# Patient Record
Sex: Male | Born: 1960 | Race: White | Hispanic: No | Marital: Married | Smoking: Former smoker
Health system: Southern US, Community
[De-identification: ages and names within clinical notes are randomized; demographics above are authoritative.]

## PROBLEM LIST (undated history)

## (undated) DIAGNOSIS — M5136 Other intervertebral disc degeneration, lumbar region: Secondary | ICD-10-CM

## (undated) DIAGNOSIS — J45909 Unspecified asthma, uncomplicated: Secondary | ICD-10-CM

## (undated) DIAGNOSIS — E785 Hyperlipidemia, unspecified: Secondary | ICD-10-CM

## (undated) DIAGNOSIS — K219 Gastro-esophageal reflux disease without esophagitis: Secondary | ICD-10-CM

## (undated) DIAGNOSIS — R05 Cough: Secondary | ICD-10-CM

## (undated) DIAGNOSIS — I2510 Atherosclerotic heart disease of native coronary artery without angina pectoris: Secondary | ICD-10-CM

## (undated) DIAGNOSIS — M1990 Unspecified osteoarthritis, unspecified site: Secondary | ICD-10-CM

## (undated) DIAGNOSIS — R55 Syncope and collapse: Secondary | ICD-10-CM

## (undated) DIAGNOSIS — F419 Anxiety disorder, unspecified: Secondary | ICD-10-CM

## (undated) DIAGNOSIS — M549 Dorsalgia, unspecified: Secondary | ICD-10-CM

## (undated) DIAGNOSIS — M5030 Other cervical disc degeneration, unspecified cervical region: Secondary | ICD-10-CM

## (undated) DIAGNOSIS — G4733 Obstructive sleep apnea (adult) (pediatric): Secondary | ICD-10-CM

## (undated) DIAGNOSIS — R054 Cough syncope: Secondary | ICD-10-CM

## (undated) DIAGNOSIS — J449 Chronic obstructive pulmonary disease, unspecified: Secondary | ICD-10-CM

## (undated) DIAGNOSIS — M51369 Other intervertebral disc degeneration, lumbar region without mention of lumbar back pain or lower extremity pain: Secondary | ICD-10-CM

## (undated) DIAGNOSIS — Z87442 Personal history of urinary calculi: Secondary | ICD-10-CM

## (undated) DIAGNOSIS — Z85828 Personal history of other malignant neoplasm of skin: Secondary | ICD-10-CM

## (undated) DIAGNOSIS — I452 Bifascicular block: Secondary | ICD-10-CM

## (undated) HISTORY — DX: Gastro-esophageal reflux disease without esophagitis: K21.9

## (undated) HISTORY — DX: Personal history of other malignant neoplasm of skin: Z85.828

## (undated) HISTORY — DX: Bifascicular block: I45.2

## (undated) HISTORY — DX: Dorsalgia, unspecified: M54.9

## (undated) HISTORY — DX: Other intervertebral disc degeneration, lumbar region without mention of lumbar back pain or lower extremity pain: M51.369

## (undated) HISTORY — DX: Syncope and collapse: R55

## (undated) HISTORY — DX: Cough: R05

## (undated) HISTORY — DX: Obstructive sleep apnea (adult) (pediatric): G47.33

## (undated) HISTORY — DX: Other cervical disc degeneration, unspecified cervical region: M50.30

## (undated) HISTORY — DX: Other intervertebral disc degeneration, lumbar region: M51.36

## (undated) HISTORY — DX: Cough syncope: R05.4

## (undated) HISTORY — DX: Anxiety disorder, unspecified: F41.9

## (undated) HISTORY — DX: Chronic obstructive pulmonary disease, unspecified: J44.9

## (undated) HISTORY — PX: OTHER SURGICAL HISTORY: SHX169

## (undated) HISTORY — DX: Hyperlipidemia, unspecified: E78.5

---

## 1997-09-23 ENCOUNTER — Ambulatory Visit (HOSPITAL_COMMUNITY): Admission: RE | Admit: 1997-09-23 | Discharge: 1997-09-23 | Payer: Self-pay | Admitting: Neurosurgery

## 1998-03-04 ENCOUNTER — Encounter: Payer: Self-pay | Admitting: Neurosurgery

## 1998-03-04 ENCOUNTER — Ambulatory Visit (HOSPITAL_COMMUNITY): Admission: RE | Admit: 1998-03-04 | Discharge: 1998-03-04 | Payer: Self-pay | Admitting: Neurosurgery

## 1999-12-12 ENCOUNTER — Ambulatory Visit (HOSPITAL_BASED_OUTPATIENT_CLINIC_OR_DEPARTMENT_OTHER): Admission: RE | Admit: 1999-12-12 | Discharge: 1999-12-12 | Payer: Self-pay | Admitting: Pulmonary Disease

## 2000-05-29 ENCOUNTER — Encounter: Payer: Self-pay | Admitting: Unknown Physician Specialty

## 2000-05-29 ENCOUNTER — Encounter: Admission: RE | Admit: 2000-05-29 | Discharge: 2000-05-29 | Payer: Self-pay | Admitting: Unknown Physician Specialty

## 2000-10-13 ENCOUNTER — Encounter: Payer: Self-pay | Admitting: Pulmonary Disease

## 2000-10-13 ENCOUNTER — Ambulatory Visit (HOSPITAL_BASED_OUTPATIENT_CLINIC_OR_DEPARTMENT_OTHER): Admission: RE | Admit: 2000-10-13 | Discharge: 2000-10-13 | Payer: Self-pay | Admitting: Pulmonary Disease

## 2000-12-28 ENCOUNTER — Ambulatory Visit (HOSPITAL_COMMUNITY): Admission: AD | Admit: 2000-12-28 | Discharge: 2000-12-28 | Payer: Self-pay | Admitting: Neurosurgery

## 2000-12-28 ENCOUNTER — Encounter: Payer: Self-pay | Admitting: Neurosurgery

## 2001-11-26 ENCOUNTER — Ambulatory Visit (HOSPITAL_COMMUNITY): Admission: RE | Admit: 2001-11-26 | Discharge: 2001-11-26 | Payer: Self-pay | Admitting: Internal Medicine

## 2002-02-01 ENCOUNTER — Emergency Department (HOSPITAL_COMMUNITY): Admission: EM | Admit: 2002-02-01 | Discharge: 2002-02-01 | Payer: Self-pay | Admitting: Emergency Medicine

## 2002-04-03 ENCOUNTER — Ambulatory Visit (HOSPITAL_COMMUNITY): Admission: RE | Admit: 2002-04-03 | Discharge: 2002-04-03 | Payer: Self-pay | Admitting: Internal Medicine

## 2002-09-09 ENCOUNTER — Ambulatory Visit (HOSPITAL_COMMUNITY): Admission: RE | Admit: 2002-09-09 | Discharge: 2002-09-09 | Payer: Self-pay | Admitting: Neurosurgery

## 2002-09-09 ENCOUNTER — Encounter: Payer: Self-pay | Admitting: Neurosurgery

## 2003-09-04 ENCOUNTER — Encounter: Payer: Self-pay | Admitting: Pulmonary Disease

## 2003-09-04 ENCOUNTER — Ambulatory Visit (HOSPITAL_BASED_OUTPATIENT_CLINIC_OR_DEPARTMENT_OTHER): Admission: RE | Admit: 2003-09-04 | Discharge: 2003-09-04 | Payer: Self-pay | Admitting: Pulmonary Disease

## 2004-08-24 ENCOUNTER — Ambulatory Visit: Payer: Self-pay | Admitting: Pulmonary Disease

## 2005-06-23 ENCOUNTER — Ambulatory Visit: Payer: Self-pay | Admitting: Family Medicine

## 2005-07-08 ENCOUNTER — Ambulatory Visit: Payer: Self-pay | Admitting: Pulmonary Disease

## 2005-12-28 ENCOUNTER — Ambulatory Visit: Payer: Self-pay | Admitting: Internal Medicine

## 2006-01-04 ENCOUNTER — Ambulatory Visit: Payer: Self-pay | Admitting: Pulmonary Disease

## 2006-04-12 ENCOUNTER — Emergency Department (HOSPITAL_COMMUNITY): Admission: EM | Admit: 2006-04-12 | Discharge: 2006-04-12 | Payer: Self-pay | Admitting: Emergency Medicine

## 2006-04-14 ENCOUNTER — Ambulatory Visit: Payer: Self-pay | Admitting: Pulmonary Disease

## 2006-09-13 ENCOUNTER — Ambulatory Visit: Payer: Self-pay | Admitting: Pulmonary Disease

## 2007-03-12 ENCOUNTER — Ambulatory Visit: Payer: Self-pay | Admitting: Pulmonary Disease

## 2007-05-11 DIAGNOSIS — J439 Emphysema, unspecified: Secondary | ICD-10-CM | POA: Insufficient documentation

## 2007-05-11 DIAGNOSIS — J209 Acute bronchitis, unspecified: Secondary | ICD-10-CM | POA: Insufficient documentation

## 2007-05-11 DIAGNOSIS — K219 Gastro-esophageal reflux disease without esophagitis: Secondary | ICD-10-CM | POA: Insufficient documentation

## 2007-05-11 DIAGNOSIS — G4733 Obstructive sleep apnea (adult) (pediatric): Secondary | ICD-10-CM | POA: Insufficient documentation

## 2007-06-27 ENCOUNTER — Ambulatory Visit: Payer: Self-pay | Admitting: Pulmonary Disease

## 2007-08-02 ENCOUNTER — Emergency Department (HOSPITAL_COMMUNITY): Admission: EM | Admit: 2007-08-02 | Discharge: 2007-08-02 | Payer: Self-pay | Admitting: Emergency Medicine

## 2007-12-31 ENCOUNTER — Ambulatory Visit: Payer: Self-pay | Admitting: Pulmonary Disease

## 2008-08-20 ENCOUNTER — Telehealth (INDEPENDENT_AMBULATORY_CARE_PROVIDER_SITE_OTHER): Payer: Self-pay | Admitting: *Deleted

## 2008-09-08 ENCOUNTER — Ambulatory Visit: Payer: Self-pay | Admitting: Pulmonary Disease

## 2009-03-27 ENCOUNTER — Ambulatory Visit: Payer: Self-pay | Admitting: Pulmonary Disease

## 2009-06-02 ENCOUNTER — Telehealth: Payer: Self-pay | Admitting: Pulmonary Disease

## 2009-09-20 ENCOUNTER — Encounter: Payer: Self-pay | Admitting: Cardiology

## 2009-09-20 ENCOUNTER — Emergency Department (HOSPITAL_COMMUNITY): Admission: EM | Admit: 2009-09-20 | Discharge: 2009-09-20 | Payer: Self-pay | Admitting: Emergency Medicine

## 2009-09-21 ENCOUNTER — Encounter: Payer: Self-pay | Admitting: Cardiology

## 2009-10-02 ENCOUNTER — Encounter: Payer: Self-pay | Admitting: Cardiology

## 2009-10-19 ENCOUNTER — Encounter: Payer: Self-pay | Admitting: Cardiology

## 2009-10-20 ENCOUNTER — Ambulatory Visit: Payer: Self-pay | Admitting: Cardiology

## 2009-10-20 DIAGNOSIS — R55 Syncope and collapse: Secondary | ICD-10-CM | POA: Insufficient documentation

## 2009-10-20 DIAGNOSIS — E663 Overweight: Secondary | ICD-10-CM | POA: Insufficient documentation

## 2009-10-20 DIAGNOSIS — R079 Chest pain, unspecified: Secondary | ICD-10-CM | POA: Insufficient documentation

## 2009-10-20 DIAGNOSIS — F17200 Nicotine dependence, unspecified, uncomplicated: Secondary | ICD-10-CM | POA: Insufficient documentation

## 2009-10-20 DIAGNOSIS — I4510 Unspecified right bundle-branch block: Secondary | ICD-10-CM | POA: Insufficient documentation

## 2009-10-26 ENCOUNTER — Encounter: Payer: Self-pay | Admitting: Cardiology

## 2009-10-26 ENCOUNTER — Ambulatory Visit: Payer: Self-pay | Admitting: Cardiology

## 2009-11-09 ENCOUNTER — Encounter: Payer: Self-pay | Admitting: Cardiology

## 2009-11-10 ENCOUNTER — Ambulatory Visit: Payer: Self-pay | Admitting: Cardiology

## 2010-07-20 NOTE — Miscellaneous (Signed)
  Clinical Lists Changes  Problems: Added new problem of TOBACCO ABUSE (ICD-305.1) Added new problem of OVERWEIGHT (ICD-278.02) Added new problem of CHEST PAIN (ICD-786.50) Added new problem of RBBB (ICD-426.4) Added new problem of SYNCOPE (ICD-780.2) Added new problem of * ANXIETY Observations: Added new observation of PRIMARY MD: Ardeen Garland, MD (10/20/2009 10:25) Added new observation of PAST MED HX: OSA....c-pap..Dr. Shelle Iron Tobacco abuse Emphysema Overweight Chest pain RBBB Syncope  negative w/u in the past Anxiety (10/20/2009 10:25)       Past History:  Past Medical History: OSA....c-pap..Dr. Shelle Iron Tobacco abuse Emphysema Overweight Chest pain RBBB Syncope  negative w/u in the past Anxiety

## 2010-07-20 NOTE — Assessment & Plan Note (Signed)
Summary: 3WK FU-  Medications Added CITALOPRAM HYDROBROMIDE 20 MG TABS (CITALOPRAM HYDROBROMIDE) Take 1 tablet by mouth once a day      Allergies Added: NKDA  Visit Type:  Follow-up Referring Provider:  Clance Primary Provider:  Ardeen Garland, MD  CC:  right bundle branch block.  History of Present Illness: The patient is seen for followup of right bundle branch block and some chest discomfort.  When I saw him on Oct 20, 2009 I thought that the chest discomfort was related to coughing.  In addition there was history of right bundle branch block in the past.  Decision was made to document his LV function to ensure he was stable.  Echo was done Oct 26, 2009.  Ejection fraction is 55-60%.  There is normal right ventricular size and function.  There was no significant tricuspid regurgitation, so right heart pressures could not be estimated.  However there was no evidence that it was elevated.  He is been stable since that visit.  Preventive Screening-Counseling & Management  Alcohol-Tobacco     Smoking Status: current     Smoking Cessation Counseling: yes     Packs/Day: 2.5 PPD  Current Medications (verified): 1)  Advair Diskus 500-50 Mcg/dose  Misc (Fluticasone-Salmeterol) .... Inhale One Puff Two Times A Day 2)  Bayer Low Strength 81 Mg  Tbec (Aspirin) .... Take By Mouth Once Daily 3)  Nexium 40 Mg  Cpdr (Esomeprazole Magnesium) .... Take By Mouth Two Times A Day 4)  Proair Hfa 108 (90 Base) Mcg/act  Aers (Albuterol Sulfate) .Marland Kitchen.. 1-2 Puffs Every 4-6 Hours As Needed 5)  Citalopram Hydrobromide 20 Mg Tabs (Citalopram Hydrobromide) .... Take 1 Tablet By Mouth Once A Day 6)  Arthrotec 75-200 Mg-Mcg Tabs (Diclofenac-Misoprostol) .... Take 1 Tablet By Mouth Two Times A Day  Allergies (verified): No Known Drug Allergies  Comments:  Nurse/Medical Assistant: The patient's medications and allergies were reviewed with the patient and were updated in the Medication and Allergy Lists. Verbally  reviewed.  Past History:  Past Medical History: Last updated: 11/09/2009 OSA....c-pap..Dr. Shelle Iron Tobacco abuse Emphysema Overweight Chest pain RBBB Syncope  negative w/u in the past Anxiety Cough syncope Back pain EF   55-60%...echo...10/26/2009 (could not estimate RV pressure--RV OK)  Social History: Packs/Day:  2.5 PPD  Review of Systems       Patient denies fever, chills, headache, sweats, rash, change in vision, change in hearing, chest pain, cough, nausea or vomiting, urinary symptoms.  All other systems are reviewed and are negative.  Vital Signs:  Patient profile:   50 year old male Height:      71 inches Weight:      344 pounds Pulse rate:   80 / minute BP sitting:   117 / 72  (left arm) Cuff size:   large  Vitals Entered By: Carlye Grippe (Nov 10, 2009 1:38 PM) CC: right bundle branch block   Physical Exam  General:  patient is overweight but stable. Eyes:  no xanthelasma. Neck:  no jugular venous distention. Lungs:  lungs are clear respiratory effort is nonlabored. Heart:  cardiac exam reveals S1-S2.  No clicks or significant murmurs. Abdomen:  abdomen soft. Extremities:  no peripheral edema. Psych:  patient is oriented to person time and place.  Affect is normal.   Impression & Recommendations:  Problem # 1:  SYNCOPE (ICD-780.2)  His updated medication list for this problem includes:    Bayer Low Strength 81 Mg Tbec (Aspirin) .Marland Kitchen... Take by mouth once  daily No recurrent syncope.  No further workup.  Problem # 2:  RBBB (ICD-426.4)  His updated medication list for this problem includes:    Bayer Low Strength 81 Mg Tbec (Aspirin) .Marland Kitchen... Take by mouth once daily Right bundle branch block is old.  There is normal LV function.  No further workup.  Problem # 3:  TOBACCO ABUSE (ICD-305.1) the patient clearly needs to stop smoking.  He is counseled by me to do so.  Problem # 4:  OVERWEIGHT (ICD-278.02) Patient needs to lose weight.  Patient  Instructions: 1)  Your physician discussed the hazards of tobacco use.  Tobacco use cessation is recommended and techniques and options to help you quit were discussed.

## 2010-07-20 NOTE — Letter (Signed)
Summary: External Correspondence/ NOTE PRIMARY CARE  External Correspondence/ NOTE PRIMARY CARE   Imported By: Dorise Hiss 10/05/2009 15:52:14  _____________________________________________________________________  External Attachment:    Type:   Image     Comment:   External Document

## 2010-07-20 NOTE — Letter (Signed)
Summary: Internal Other/ PATIENT HISTORY FORM  Internal Other/ PATIENT HISTORY FORM   Imported By: Dorise Hiss 10/21/2009 14:33:18  _____________________________________________________________________  External Attachment:    Type:   Image     Comment:   External Document

## 2010-07-20 NOTE — Miscellaneous (Signed)
  Clinical Lists Changes  Observations: Added new observation of PAST MED HX: OSA....c-pap..Dr. Shelle Iron Tobacco abuse Emphysema Overweight Chest pain RBBB Syncope  negative w/u in the past Anxiety Cough syncope Back pain EF   55-60%...echo...10/26/2009 (could not estimate RV pressure--RV OK) (11/09/2009 10:56) Added new observation of REFERRING MD: Clance (11/09/2009 10:56) Added new observation of PRIMARY MD: Ardeen Garland, MD (11/09/2009 10:56)       Past History:  Past Medical History: OSA....c-pap..Dr. Shelle Iron Tobacco abuse Emphysema Overweight Chest pain RBBB Syncope  negative w/u in the past Anxiety Cough syncope Back pain EF   55-60%...echo...10/26/2009 (could not estimate RV pressure--RV OK)

## 2010-07-20 NOTE — Assessment & Plan Note (Signed)
Summary: np-abnormal ekg  Medications Added LEXAPRO 20 MG TABS (ESCITALOPRAM OXALATE) Take 1 tablet by mouth once a day ARTHROTEC 75-200 MG-MCG TABS (DICLOFENAC-MISOPROSTOL) Take 1 tablet by mouth two times a day      Allergies Added: NKDA  Visit Type:  consult Referring Provider:  Clance Primary Provider:  Ardeen Garland, MD  CC:  abnormal EKG.  History of Present Illness: The patient is here for cardiac evaluation to assess an abnormal EKG. the patient is a heavy smoker.  He has COPD.  He has sleep apnea and he does use CPAP.  He is followed by Dr. Shelle Iron.  Recently he had some chest discomfort related to coughing.  EKG was noted to show right bundle branch block.  He is referred for further evaluation.  He's not been having any significant chest pain.  There is no prior history of coronary disease.  Preventive Screening-Counseling & Management  Alcohol-Tobacco     Smoking Status: current     Smoking Cessation Counseling: yes     Packs/Day: 2&1/2 PPD  Current Medications (verified): 1)  Advair Diskus 500-50 Mcg/dose  Misc (Fluticasone-Salmeterol) .... Inhale One Puff Two Times A Day 2)  Bayer Low Strength 81 Mg  Tbec (Aspirin) .... Take By Mouth Once Daily 3)  Nexium 40 Mg  Cpdr (Esomeprazole Magnesium) .... Take By Mouth Two Times A Day 4)  Proair Hfa 108 (90 Base) Mcg/act  Aers (Albuterol Sulfate) .Marland Kitchen.. 1-2 Puffs Every 4-6 Hours As Needed 5)  Lexapro 20 Mg Tabs (Escitalopram Oxalate) .... Take 1 Tablet By Mouth Once A Day 6)  Arthrotec 75-200 Mg-Mcg Tabs (Diclofenac-Misoprostol) .... Take 1 Tablet By Mouth Two Times A Day  Allergies (verified): No Known Drug Allergies  Comments:  Nurse/Medical Assistant: The patient's medications and allergies were reviewed with the patient and were updated in the Medication and Allergy Lists. List reviewed.  Past History:  Past Medical History: OSA....c-pap..Dr. Shelle Iron Tobacco abuse Emphysema Overweight Chest pain RBBB Syncope   negative w/u in the past Anxiety Cough syncope Back pain  Social History: Packs/Day:  2&1/2 PPD  Review of Systems       Patient denies fever, chills, headache, sweats, rash, change in vision, change in hearing, nausea vomiting, urinary symptoms.  All of the systems are reviewed and are negative.  Vital Signs:  Patient profile:   50 year old male Height:      71 inches Weight:      341 pounds Pulse rate:   88 / minute BP sitting:   104 / 70  (left arm) Cuff size:   large  Vitals Entered By: Carlye Grippe (Oct 20, 2009 2:15 PM) CC: abnormal EKG   Physical Exam  General:  The patient is stable.  He does smell of cigarette smoke.Is also overweight. Head:  head is atraumatic. Eyes:  eyes reveal no xanthelasma. Neck:  no jugular venous distention.  No carotid bruits. Chest Wall:  no chest wall tenderness today. Lungs:  lungs are clear.  Respiratory effort is nonlabored. Heart:  cardiac exam reveals S1-S2.  There are no clicks or significant murmurs. Abdomen:  abdomen is obese. Msk:  no musculoskeletal deformities. Extremities:  no peripheral edema. Skin:  no skin rashes. Psych:  patient is oriented to person time and place.  Affect is normal.   Impression & Recommendations:  Problem # 1:  RBBB (ICD-426.4)  His updated medication list for this problem includes:    Bayer Low Strength 81 Mg Tbec (Aspirin) .Marland Kitchen... Take by  mouth once daily EKG is done today and reviewed by me.  It is compared to a tracing from September 20, 2009.  Patient has incomplete right bundle branch block.  There are no Q waves.  There is normal sinus rhythm.  Because of this finding we will proceed with two-dimensional echo to assess LV function.  He does not need exercise testing.  I will see him back for followup. it is okay for him to go back to full activities in the meantime.  Orders: 2-D Echocardiogram (2D Echo)  Problem # 2:  CHEST PAIN (ICD-786.50)  His updated medication list for this problem  includes:    Bayer Low Strength 81 Mg Tbec (Aspirin) .Marland Kitchen... Take by mouth once daily  Orders: EKG w/ Interpretation (93000) Patient's recent chest pain was related to coughing.  No further workup at this time.  His updated medication list for this problem includes:    Bayer Low Strength 81 Mg Tbec (Aspirin) .Marland Kitchen... Take by mouth once daily  Problem # 3:  OVERWEIGHT (ICD-278.02) The patient needs to lose weight.  Problem # 4:  TOBACCO ABUSE (ICD-305.1) Ufortunately the patient continues to smoke heavily.  I counseled him to stop.  Problem # 5:  SYNCOPE (ICD-780.2)  His updated medication list for this problem includes:    Bayer Low Strength 81 Mg Tbec (Aspirin) .Marland Kitchen... Take by mouth once daily The patient has a history of cough syncope.  He knows that he should always be sitting down if he begins to cough.  Patient Instructions: 1)  Follow up appt with Dr. Myrtis Ser on Tuesday, May 24th at 1:30pm. 2)  Your physician has requested that you have an echocardiogram.  Echocardiography is a painless test that uses sound waves to create images of your heart. It provides your doctor with information about the size and shape of your heart and how well your heart's chambers and valves are working.  This procedure takes approximately one hour. There are no restrictions for this procedure. 3)  Your physician recommends that you continue on your current medications as directed. Please refer to the Current Medication list given to you today.

## 2010-07-20 NOTE — Medication Information (Signed)
Summary: RX Folder/ [facility]  RX Folder/ [facility]   Imported By: Dorise Hiss 10/05/2009 15:56:50  _____________________________________________________________________  External Attachment:    Type:   Image     Comment:   External Document

## 2010-07-20 NOTE — Letter (Signed)
Summary: External Correspondence/ NOTE PRIMARY CARE  External Correspondence/ NOTE PRIMARY CARE   Imported By: Dorise Hiss 10/05/2009 15:49:06  _____________________________________________________________________  External Attachment:    Type:   Image     Comment:   External Document

## 2010-08-25 ENCOUNTER — Telehealth (INDEPENDENT_AMBULATORY_CARE_PROVIDER_SITE_OTHER): Payer: Self-pay | Admitting: *Deleted

## 2010-08-26 ENCOUNTER — Ambulatory Visit: Payer: Self-pay | Admitting: Pulmonary Disease

## 2010-08-26 ENCOUNTER — Telehealth (INDEPENDENT_AMBULATORY_CARE_PROVIDER_SITE_OTHER): Payer: Self-pay | Admitting: *Deleted

## 2010-08-30 ENCOUNTER — Ambulatory Visit: Payer: Self-pay | Admitting: Pulmonary Disease

## 2010-08-31 ENCOUNTER — Telehealth: Payer: Self-pay | Admitting: Pulmonary Disease

## 2010-08-31 NOTE — Progress Notes (Signed)
Summary: Advair sample<<<pt needs to keep OV on 3/12 for sample or refill  Phone Note Call from Patient Call back at Home Phone [phone]   Caller: Patient Call For: clance Reason for Call: Talk to Nurse Summary of Call: Patient asking for sample of advair.  Initial call taken by: Lehman Prom,  August 26, 2010 9:51 AM  Follow-up for Phone Call        patient called back and wanted to know if his wife can pick up the samples of Advir she is at [facility] with her mother. He can be reached at [phone].Vedia Coffer  August 26, 2010 10:16 AM  Pt last seen by [facility] 03/27/2009. Pt aware we cannot leave sample of Advair or call in RX before his appt with [facility] on Mon., 08/30/2010. If he is having any problems he will need to go to an urgent care or to the ER. Pt did not want OV with TP. Pt aware he MUST keep OV w/ [facility] on 08/30/2010 for any refills. Follow-up by: Michel Bickers CMA,  August 26, 2010 10:35 AM

## 2010-08-31 NOTE — Progress Notes (Signed)
Summary: advair refill  Phone Note Call from Patient Call back at Home Phone [phone]   Caller: Patient Call For: clance Summary of Call: pt requests a rx for advair (wants 3 months supply). i advised that pt needed to make an appt w/ kc as he hasn't been seen since 03/27/2009. pt will see kc tomorrow. he says he will need a sample at time of visit.  Initial call taken by: Tivis Ringer, CNA,  August 25, 2010 1:02 PM  Follow-up for Phone Call        Spoke with pt.  He is requesting 90 day supply of advair and proair be sent to mail order pharm.  I advised that we can give him sample at [facility] tommorrow and send in rx then, or we can send 1 month supply to local pharm.  He states that he would rather just have the proair sent to local pharm since he can not afford both right now.  He states will be sure and keep appt tommorrow for to have advair refilled.  I advised that he should only use the proair as needed today and do not use like he does the advair b/c meds do not work the same way and he verbalized understanding. Follow-up by: Vernie Murders,  August 25, 2010 2:24 PM    Prescriptions: PROAIR HFA 108 (90 BASE) MCG/ACT  AERS (ALBUTEROL SULFATE) 1-2 puffs every 4-6 hours as needed  #1 x 0   Entered by:   Vernie Murders   Authorized by:   Barbaraann Share MD   Signed by:   Vernie Murders on 08/25/2010   Method used:   Electronically to        [facility] [phone]* (retail)       [address]       [place], [place]  [address]       Ph: [phone] or [phone]       Fax: [phone]   RxID:   [id number]

## 2010-09-02 ENCOUNTER — Ambulatory Visit: Payer: Self-pay | Admitting: Pulmonary Disease

## 2010-09-03 ENCOUNTER — Encounter: Payer: Self-pay | Admitting: Pulmonary Disease

## 2010-09-03 ENCOUNTER — Ambulatory Visit (INDEPENDENT_AMBULATORY_CARE_PROVIDER_SITE_OTHER): Payer: Medicare Other | Admitting: Pulmonary Disease

## 2010-09-03 DIAGNOSIS — J449 Chronic obstructive pulmonary disease, unspecified: Secondary | ICD-10-CM

## 2010-09-03 DIAGNOSIS — R059 Cough, unspecified: Secondary | ICD-10-CM | POA: Insufficient documentation

## 2010-09-03 DIAGNOSIS — R05 Cough: Secondary | ICD-10-CM | POA: Insufficient documentation

## 2010-09-03 DIAGNOSIS — G4733 Obstructive sleep apnea (adult) (pediatric): Secondary | ICD-10-CM

## 2010-09-07 NOTE — Progress Notes (Signed)
Summary: nos appt  Phone Note Call from Patient   Caller: [email or web address]  Call For: clance Summary of Call: [facility] nos from 3/12 to 3/16. Initial call taken by: Darletta Moll,  August 31, 2010 3:54 PM

## 2010-09-08 LAB — POCT CARDIAC MARKERS: CKMB, poc: 1.5 ng/mL (ref 1.0–8.0)

## 2010-09-08 LAB — CBC
HCT: 44.4 % (ref 39.0–52.0)
Hemoglobin: 15.7 g/dL (ref 13.0–17.0)
MCHC: 35.3 g/dL (ref 30.0–36.0)
MCV: 92.1 fL (ref 78.0–100.0)
RDW: 13.8 % (ref 11.5–15.5)

## 2010-09-08 LAB — BASIC METABOLIC PANEL
CO2: 26 mEq/L (ref 19–32)
Calcium: 9.3 mg/dL (ref 8.4–10.5)
Chloride: 102 mEq/L (ref 96–112)
Glucose, Bld: 110 mg/dL — ABNORMAL HIGH (ref 70–99)
Sodium: 136 mEq/L (ref 135–145)

## 2010-09-08 LAB — DIFFERENTIAL
Basophils Absolute: 0 10*3/uL (ref 0.0–0.1)
Basophils Relative: 0 % (ref 0–1)
Eosinophils Absolute: 0.1 10*3/uL (ref 0.0–0.7)
Eosinophils Relative: 1 % (ref 0–5)
Monocytes Absolute: 0.5 10*3/uL (ref 0.1–1.0)

## 2010-09-08 LAB — D-DIMER, QUANTITATIVE: D-Dimer, Quant: 0.23 ug/mL-FEU (ref 0.00–0.48)

## 2010-09-14 ENCOUNTER — Telehealth: Payer: Self-pay | Admitting: Pulmonary Disease

## 2010-09-14 NOTE — Telephone Encounter (Signed)
Called [facility]  And gave a verbal order for 10-20cm on auto cpap

## 2010-09-14 NOTE — Telephone Encounter (Signed)
Please advise pcc. Thanks  Carver Fila, CMA

## 2010-09-14 NOTE — Telephone Encounter (Signed)
Ok to start at 10-20.  Let pcc know so they can call

## 2010-09-15 NOTE — Telephone Encounter (Signed)
Unsure why this was sent back to triage, msg has been taken care of.  CPAP added to Care Coordination Notes.  Will sign off.

## 2010-09-16 NOTE — Assessment & Plan Note (Signed)
Summary: rov for copd, cough syncope, and osa   Primary Provider/Referring Provider:  Ardeen Garland, MD  CC:  Overdue for a f/u appt.  Last seen Oct. 2010.  Pt requesting rx for 90 day supply for Advair. Pt states he is using his rescue inhaler approx 1 to 3 x daily.  Pt is currently smoking 2 ppd.   Pt c/o increased sob with exertion and occ cough with green sputum. Marland Kitchen  History of Present Illness: the pt comes in today for f/u of his known copd, cough syncope, and osa.  He has not been seen since 2010, and continues to smoke 2ppd.  He continues to have doe, but it is near his usual baseline.  He still uses his rescue inhaler at least twice a day.  He has chronic mucus production, and is discolored at times. He continues to have cough, with syncope at times.  He is wearing his cpap, but has not been keeping up with supplies or mask.    Preventive Screening-Counseling & Management  Alcohol-Tobacco     Smoking Status: current     Smoking Cessation Counseling: yes     Packs/Day: 2.0     Tobacco Counseling: to quit use of tobacco products  Current Medications (verified): 1)  Advair Diskus 500-50 Mcg/dose  Misc (Fluticasone-Salmeterol) .... Inhale One Puff Two Times A Day **pt Needs Ov For Additional Refills ** 2)  Bayer Low Strength 81 Mg  Tbec (Aspirin) .... Take By Mouth Once Daily 3)  Nexium 40 Mg  Cpdr (Esomeprazole Magnesium) .... Take By Mouth Two Times A Day 4)  Proair Hfa 108 (90 Base) Mcg/act  Aers (Albuterol Sulfate) .Marland Kitchen.. 1-2 Puffs Every 4-6 Hours As Needed 5)  Citalopram Hydrobromide 20 Mg Tabs (Citalopram Hydrobromide) .... Take 1 Tablet By Mouth Once A Day 6)  Diclofenac Sodium 75 Mg Tbec (Diclofenac Sodium) .... Take 1 Tablet By Mouth Two Times A Day 7)  Hydrocodone-Acetaminophen 5-500 Mg Tabs (Hydrocodone-Acetaminophen) .Marland Kitchen.. 1 Every 6 Hours For Pain  Allergies (verified): No Known Drug Allergies  Past History:  Past medical, surgical, family and social histories (including risk  factors) reviewed, and no changes noted (except as noted below).  Past Medical History: Reviewed history from 11/09/2009 and no changes required. OSA....c-pap..Dr. Shelle Iron Tobacco abuse Emphysema Overweight Chest pain RBBB Syncope  negative w/u in the past Anxiety Cough syncope Back pain EF   55-60%...echo...10/26/2009 (could not estimate RV pressure--RV OK)  Family History: Reviewed history and no changes required.  Social History: Reviewed history from 03/27/2009 and no changes required. Patient is a current smoker.  2 ppd.   Packs/Day:  2.0  Review of Systems       The patient complains of shortness of breath with activity, productive cough, acid heartburn, indigestion, weight change, nasal congestion/difficulty breathing through nose, joint stiffness or pain, and change in color of mucus.  The patient denies shortness of breath at rest, non-productive cough, coughing up blood, chest pain, irregular heartbeats, loss of appetite, abdominal pain, difficulty swallowing, sore throat, tooth/dental problems, headaches, sneezing, itching, ear ache, anxiety, depression, hand/feet swelling, rash, and fever.    Vital Signs:  Patient profile:   50 year old male Height:      71 inches Weight:      365.25 pounds BMI:     51.13 O2 Sat:      95 % on Room air Temp:     98.0 degrees F oral Pulse rate:   88 / minute BP sitting:  134 / 78  (left arm) Cuff size:   large  Vitals Entered By: Arman Filter LPN (September 03, 2010 11:07 AM)  O2 Flow:  Room air CC: Overdue for a f/u appt.  Last seen Oct. 2010.  Pt requesting rx for 90 day supply for Advair. Pt states he is using his rescue inhaler approx 1 to 3 x daily.  Pt is currently smoking 2 ppd.   Pt c/o increased sob with exertion and occ cough with green sputum.  Comments Medications reviewed with patient Arman Filter LPN  September 03, 2010 11:13 AM    Physical Exam  General:  morbidly obese male in nad  Nose:  no skin breakdown or  pressure necrosis from cpap mask Lungs:  mild decrease in bs, no wheezing or rhonchi Heart:  rrr Extremities:  mild edema, no cyanosis  Neurologic:  alert and oriented, moves all 4    Impression & Recommendations:  Problem # 1:  COUGH (ICD-786.2) the pt continues to have cough with syncope at times.  I have explained to him again the first step is to stop smoking!!  His is also on advair which can sometime irritate the upper airway and worsen cough.  Will try him on dulera to see if his cough improves.    Problem # 2:  COPD (ICD-496) the pt continues to have doe that is near his usual baseline.  He has not had a recent exacerbation or pulmonary infection by his history.  I have again stressed the need for him to stop smoking.  Problem # 3:  OBSTRUCTIVE SLEEP APNEA (ICD-327.23) the pt needs new cpap supplies, and would benefit from re-optimization of his pressure.  I have asked him to work aggressively on weight loss.    Medications Added to Medication List This Visit: 1)  Diclofenac Sodium 75 Mg Tbec (Diclofenac sodium) .... Take 1 tablet by mouth two times a day 2)  Hydrocodone-acetaminophen 5-500 Mg Tabs (Hydrocodone-acetaminophen) .Marland Kitchen.. 1 every 6 hours for pain  Other Orders: Est. Patient Level IV (81191) DME Referral (DME) Tobacco use cessation intermediate 3-10 minutes (47829)  Patient Instructions: 1)  will try you on dulera 200/5 in the place of advair....2 inhalations am and pm to see if less irritating to your cough.  if you think you do better with dulera, let us know.  If not, go back to your advair and let us know to send in refills.  2)  will arrange for new cpap mask and supplies, and will recheck you pressure level. 3)  work on weight loss, and quit smoking! 4)  followup with me in 6mos

## 2010-10-30 ENCOUNTER — Other Ambulatory Visit: Payer: Self-pay | Admitting: Pulmonary Disease

## 2010-10-30 DIAGNOSIS — G4733 Obstructive sleep apnea (adult) (pediatric): Secondary | ICD-10-CM

## 2010-11-01 ENCOUNTER — Telehealth: Payer: Self-pay | Admitting: Pulmonary Disease

## 2010-11-01 NOTE — Telephone Encounter (Signed)
[place] or [place], did one of you call the pt? Pls advise thanks

## 2010-11-03 NOTE — Telephone Encounter (Signed)
Spoke to daughter in law she is aware of cpap changes

## 2010-11-05 NOTE — Op Note (Signed)
NAME:  Jared [name], Jared [name]                      ACCOUNT NO.:  [id number]   MEDICAL RECORD NO.:  [id number]                   PATIENT TYPE:  AMB   LOCATION:  DAY                                  FACILITY:  [facility]   PHYSICIAN:  R. Roetta Sessions, M.D.              DATE OF BIRTH:  Oct 23, 1960   DATE OF PROCEDURE:  04/03/2002  DATE OF DISCHARGE:                                 OPERATIVE REPORT   PROCEDURE:  Diagnostic colonoscopy.   ENDOSCOPIST:  Gerrit Friends. Rourk, M.D.   PREMEDICATION:  Versed 3 mg and Demerol 75 mg IV in divided doses.   INDICATIONS FOR PROCEDURE:  The patient is a 50 year old gentleman with 2/3  mainly Hemoccult positive.  He has not had any gross blood per rectum.  He  has had some left upper quadrant abdominal discomfort which is felt to be  more musculoskeletal than visceral GI in origin.  No family history for  colorectal neoplasia.  He has never had his lower GI tract evaluated.  Colonoscopy is now being done.  This approach has been discussed with the  patient at length at the bedside.  Please see my handwritten H&P for more  information.   DESCRIPTION OF PROCEDURE:  O2 saturation, blood pressure, pulse and  respirations were monitored throughout the entire procedure.   INSTRUMENT:  Olympus video colonoscope.   FINDINGS:  Digital rectal exam revealed no abnormalities.   ENDOSCOPIC FINDINGS:  The prep was good.   RECTUM:  Examination of the rectal mucosa including the retroflexed view of  the anal verge revealed internal hemorrhoids and otherwise rectal mucosa  appeared normal.   COLON:  The colonic mucosa was surveyed from the rectosigmoid junction  through the left transverse and right colon to the area of the appendiceal  orifice, ileocecal valve, and cecum.  These structures were well seen and  photographed for the record.  The patient was noted to have left-sided  diverticula.  The remainder of the colonic mucosa to the cecum appeared  normal.   From  the level of the cecum and ileocecal valve, the scope was slowly  withdrawn.  All previously mentioned mucosal surfaces were again seen and no  other abnormalities were observed.   The patient tolerated the procedure well and was reacting in endoscopy.   IMPRESSION:  1. Internal hemorrhoids; otherwise, normal rectum.  2. Left-sided diverticulum.  The remainder of the colonic mucosa appeared     normal.   RECOMMENDATIONS:  1. Diverticulosis literature provided to the patient.  2. Daily Metamucil, Citrucel, or Benefiber fiber supplement.  3. The patient is to follow up with Dr. Colon Flattery.                                               Jonathon Bellows, M.D.  RMR/MEDQ  D:  04/03/2002  T:  04/03/2002  Job:  [id number]   cc:   Colon Flattery  [address]  [place]  [place] [address]  Fax: [phone]

## 2010-11-05 NOTE — Op Note (Signed)
[facility]  Patient:    [name], [name] Visit Number: [id number] MRN: [id number]          Service Type: END Location: DAY Attending Physician:  Jonathon Bellows Dictated by:   Roetta Sessions, M.D. Proc. Date: 11/26/01 Admit Date:  11/26/2001 Discharge Date: 11/26/2001   CC:         Colon Flattery, D.O.   Operative Report  PROCEDURE:  Diagnostic esophagogastroduodenoscopy.  ENDOSCOPIST:  Roetta Sessions, M.D.  INDICATIONS FOR PROCEDURE:  Patient is a 50 year old gentleman with an 58-month history of left upper quadrant abdominal pain.  CT of the abdomen negative at [facility].  A chemistry 20 was entirely normal.  Plain films of the left ribs also appeared normal.  EGD is now being done to further evaluate his symptoms.  He does have some reflux symptoms which are controlled on Protonix 40 mg orally daily.  The approach of the EGD has been discussed with the patient.  Potential risks, benefits, and alternatives have been reviewed, questions answered, and he is agreeable.  Please see my dictated consultation note for more information.  DESCRIPTION OF PROCEDURE:  O2 saturation, blood pressure, pulse, and respirations were monitored throughout the entire procedure.  CONSCIOUS SEDATION:  Versed 3 mg IV, Demerol 50 mg IV in divided doses.  INSTRUMENT:  Olympus video chip gastroscope.  FINDINGS:  Examination of the tubular esophagus revealed a couple of tiny distal esophageal erosions and a noncritical Schatzkis ring.  EG junction was easily traversed.  Stomach:  The gastric cavity was emptied, insufflated well with air.  A thorough examination of the gastric cavity including a retroflexed view of the proximal stomach.  Esophagogastric junction demonstrated no abnormalities aside from a small hiatal hernia.  The pylorus was patent and easily traversed.  Duodenum:  Bulb and second portion appeared normal.  THERAPEUTIC/DIAGNOSTIC MANEUVERS  PERFORMED:  None.  The patient tolerated the procedure well, was reactive in endoscopy.  IMPRESSION: 1. A couple of tiny distal esophageal erosions adjacent to a noncritical    Schatzkis ring.  The remainder of the esophageal mucosa appeared normal. 2. Small hiatal hernia.  Remainder of stomach and duodenum through the second    portion appeared normal.  I continued to be skeptical that the patients symptoms are coming from his GI tract.  I am suspicious more of a musculoskeletal neuropathic origin.  RECOMMENDATIONS: 1. Will go ahead and double check amylase, lipase, CBC, and sed rate today.    Will make further recommendations. 2. He is to continue his Protonix 40 mg orally daily. 3. Encourage weight loss. 4. Further recommendations to follow. Dictated by:   Roetta Sessions, M.D. Attending Physician:  Jonathon Bellows DD:  11/26/01 TD:  11/28/01 Job: [id number]

## 2011-01-05 ENCOUNTER — Telehealth: Payer: Self-pay | Admitting: Pulmonary Disease

## 2011-01-05 NOTE — Telephone Encounter (Signed)
I spoke with the pt and he states that his PCP sent a refill for his advair but he ran out today and was requesting a sample. He then mentioned he had the dulera that [facility] gave him that he has not used yet. I advised per last OV note he was to try this instead of the dulera to see if it worked better for him. He states he will start the dulera today. Carron Curie, CMA

## 2011-01-11 ENCOUNTER — Telehealth: Payer: Self-pay | Admitting: Pulmonary Disease

## 2011-01-11 MED ORDER — FLUTICASONE-SALMETEROL 500-50 MCG/DOSE IN AEPB: 1.0000 | INHALATION_SPRAY | Freq: Two times a day (BID) | RESPIRATORY_TRACT | Status: DC

## 2011-01-11 NOTE — Telephone Encounter (Signed)
Spoke with the pt and he states he tried [place] and it did not do as well as advair so he is requesting RX for advair. Per last OV note ok to send adviar. Rx sent. Pt aware.Carron Curie, CMA

## 2011-03-04 ENCOUNTER — Encounter: Payer: Self-pay | Admitting: Pulmonary Disease

## 2011-03-07 ENCOUNTER — Ambulatory Visit: Payer: Medicare Other | Admitting: Pulmonary Disease

## 2011-03-15 ENCOUNTER — Ambulatory Visit: Payer: Medicare Other | Admitting: Pulmonary Disease

## 2011-03-24 ENCOUNTER — Ambulatory Visit: Payer: Medicare Other | Admitting: Pulmonary Disease

## 2011-04-07 ENCOUNTER — Encounter: Payer: Self-pay | Admitting: Pulmonary Disease

## 2011-04-07 ENCOUNTER — Ambulatory Visit (INDEPENDENT_AMBULATORY_CARE_PROVIDER_SITE_OTHER): Payer: Medicare Other | Admitting: Pulmonary Disease

## 2011-04-07 DIAGNOSIS — R059 Cough, unspecified: Secondary | ICD-10-CM

## 2011-04-07 DIAGNOSIS — J449 Chronic obstructive pulmonary disease, unspecified: Secondary | ICD-10-CM

## 2011-04-07 DIAGNOSIS — G4733 Obstructive sleep apnea (adult) (pediatric): Secondary | ICD-10-CM

## 2011-04-07 DIAGNOSIS — R05 Cough: Secondary | ICD-10-CM

## 2011-04-07 NOTE — Assessment & Plan Note (Signed)
The patient continues to have good and bad days with respect to his breathing.  I suspect a lot of this is due to his morbid obesity and deconditioning, but also think his ongoing smoking continues to propagate his airway inflammation.  I have asked him to stay on his bronchodilator regimen, and to work on weight loss and smoking cessation.

## 2011-04-07 NOTE — Assessment & Plan Note (Signed)
The patient is wearing his CPAP compliantly, but feels that he is not sleeping as well.  He denies any significant mask leaks, and his spouse has not heard breakthrough snoring.  He thinks it is due to to his very old and uncomfortable mattress.  I've asked the patient to work aggressively on weight loss, and to let us know if his sleeping issues continue.  We could re\re optimize his pressure at that time.

## 2011-04-07 NOTE — Progress Notes (Signed)
  Subjective:    Patient ID: Jared [name], male    DOB: January 17, 1961, 50 y.o.   MRN: [id number]  HPI The patient comes in today for followup of his multiple issues.  He has known COPD with ongoing smoking, obstructive sleep apnea, and cough with intermittent cough syncope.  Unfortunately he continues to smoke, but feels that his chronic dyspnea on exertion is at his usual baseline.  He has not had a recent pulmonary infection or acute exacerbation.  His cough continues to be an issue at times, but is manageable.  He has been compliant with CPAP, and reports no mask or pressure issues.  He is having issues with restorative sleep, but feels that it is due to sleeping conditions.   Review of Systems  Constitutional: Negative for fever and unexpected weight change.  HENT: Positive for congestion and rhinorrhea. Negative for ear pain, nosebleeds, sore throat, sneezing, trouble swallowing, dental problem, postnasal drip and sinus pressure.   Eyes: Positive for redness and itching.  Respiratory: Positive for cough and shortness of breath. Negative for chest tightness and wheezing.   Cardiovascular: Positive for leg swelling. Negative for palpitations.  Gastrointestinal: Negative for nausea and vomiting.  Genitourinary: Negative for dysuria.  Musculoskeletal: Positive for joint swelling.  Skin: Negative for rash.  Neurological: Positive for headaches.  Hematological: Does not bruise/bleed easily.  Psychiatric/Behavioral: Negative for dysphoric mood. The patient is not nervous/anxious.        Objective:   Physical Exam Morbidly obese male in no acute distress No skin breakdown or pressure necrosis from the CPAP mask Chest with fairly clear breath sounds, no wheezing or rhonchi Cardiac exam with regular rate and rhythm Lower extremities with mild edema, no cyanosis noted Alert and oriented,  moves all 4 extremities.       Assessment & Plan:

## 2011-04-07 NOTE — Assessment & Plan Note (Signed)
The patient has chronic cough with cough syncope at times, and I truly believe this will not improve until he is able to quit smoking or greatly reduce his quantity of smoking.

## 2011-04-07 NOTE — Patient Instructions (Signed)
No change in breathing medications Continue cpap, work on weight loss.  If you continue to have issues with sleep, can re-optimize your pressure Stop smoking.  Your cough will not improve or go away until this is done.  followup with me in 6mos.

## 2011-04-20 ENCOUNTER — Encounter: Payer: Self-pay | Admitting: Pulmonary Disease

## 2011-04-28 ENCOUNTER — Telehealth: Payer: Self-pay | Admitting: Pulmonary Disease

## 2011-04-28 MED ORDER — FLUTICASONE-SALMETEROL 500-50 MCG/DOSE IN AEPB: 1.0000 | INHALATION_SPRAY | Freq: Two times a day (BID) | RESPIRATORY_TRACT | Status: DC

## 2011-04-28 NOTE — Telephone Encounter (Signed)
LMOM informing pt rx for 3 month supply of Advair sent to [facility].

## 2011-08-04 ENCOUNTER — Telehealth: Payer: Self-pay | Admitting: Pulmonary Disease

## 2011-08-04 MED ORDER — FLUTICASONE-SALMETEROL 500-50 MCG/DOSE IN AEPB: 1.0000 | INHALATION_SPRAY | Freq: Two times a day (BID) | RESPIRATORY_TRACT | Status: DC

## 2011-08-04 NOTE — Telephone Encounter (Signed)
RX has been sent to [facility]--atc pt NA wcb

## 2011-08-04 NOTE — Telephone Encounter (Signed)
ATC pt again NA WCB unable to leave VM

## 2011-08-05 NOTE — Telephone Encounter (Signed)
Advised pt Adviar Rx was sent to [facility].  Pt stated nothing further needed at this time.  Jared [name]

## 2011-08-05 NOTE — Telephone Encounter (Signed)
ATC pt at # listed but NA and unable to leave VM. I called pt mobile # listed in chart and had to [facility]

## 2011-08-05 NOTE — Telephone Encounter (Signed)
ATC PT NA unable to leave VM wcb 

## 2011-08-13 ENCOUNTER — Other Ambulatory Visit: Payer: Self-pay | Admitting: Pulmonary Disease

## 2011-08-13 DIAGNOSIS — G4733 Obstructive sleep apnea (adult) (pediatric): Secondary | ICD-10-CM

## 2011-10-06 ENCOUNTER — Ambulatory Visit: Payer: Medicare Other | Admitting: Pulmonary Disease

## 2011-10-07 ENCOUNTER — Encounter: Payer: Self-pay | Admitting: Pulmonary Disease

## 2011-10-07 ENCOUNTER — Ambulatory Visit (INDEPENDENT_AMBULATORY_CARE_PROVIDER_SITE_OTHER): Payer: BC Managed Care – PPO | Admitting: Pulmonary Disease

## 2011-10-07 VITALS — BP 116/60 | HR 86 | Temp 98.2°F | Ht 70.0 in | Wt 363.8 lb

## 2011-10-07 DIAGNOSIS — J449 Chronic obstructive pulmonary disease, unspecified: Secondary | ICD-10-CM

## 2011-10-07 DIAGNOSIS — G4733 Obstructive sleep apnea (adult) (pediatric): Secondary | ICD-10-CM

## 2011-10-07 NOTE — Assessment & Plan Note (Signed)
The pt is doing well with his new cpap machine.  He feels he is sleeping better, and has improved daytime alertness.  I have asked him to work on weight loss.

## 2011-10-07 NOTE — Assessment & Plan Note (Signed)
The pt continues to have significant doe, but is obese and deconditioned.  He is still smoking, and I have told him he will not improve until this stops.  Will keep him on advair, but will add spiriva to see if it helps.

## 2011-10-07 NOTE — Patient Instructions (Signed)
Stay on advair twice a day as you are doing Will try spiriva one inhalation each am for next 4 weeks.  If you think it is helping, call and we can send in prescription. You need to stop smoking if you want your breathing and cough to improve. Work on weight loss, and stay on cpap. followup with me in 4mos if doing ok.

## 2011-10-07 NOTE — Progress Notes (Signed)
  Subjective:    Patient ID: Jared [name], male    DOB: 1961/01/30, 51 y.o.   MRN: [id number]  HPI Patient comes in today for followup of his known COPD and also obstructive sleep apnea.  He is wearing his CPAP machine compliantly, and feels that he sleeps much better with his new device.  He is having no mask or pressure issues.  He also has COPD with frequent cough and near syncope, however continues to smoke excessively on a daily basis.  I have been unable to get him to quit over the years that I have cared for him.  His dyspnea on exertion he is a little worse than baseline, but he has been very sedentary recently.  He denies any purulent mucus.   Review of Systems  Constitutional: Positive for unexpected weight change. Negative for fever.  HENT: Positive for congestion, rhinorrhea, sneezing, postnasal drip and sinus pressure. Negative for ear pain, nosebleeds, sore throat, trouble swallowing and dental problem.   Eyes: Positive for redness and itching.  Respiratory: Positive for cough, shortness of breath and wheezing. Negative for chest tightness.   Cardiovascular: Positive for leg swelling. Negative for palpitations.  Gastrointestinal: Negative for nausea and vomiting.  Genitourinary: Negative for dysuria.  Musculoskeletal: Negative for joint swelling.  Skin: Negative for rash.  Neurological: Negative for headaches.  Hematological: Does not bruise/bleed easily.  Psychiatric/Behavioral: Negative for dysphoric mood. The patient is not nervous/anxious.        Objective:   Physical Exam Morbidly obese male in no acute distress Nose without purulence or discharge noted Chest with a few rhonchi, adequate air flow, no wheezing Cardiac exam with regular rate and rhythm Lower extremities with mild edema, no cyanosis Alert and oriented, moves all 4 extremities.       Assessment & Plan:

## 2011-10-28 ENCOUNTER — Telehealth: Payer: Self-pay | Admitting: Pulmonary Disease

## 2011-10-28 NOTE — Telephone Encounter (Signed)
LMTCB x 1.  Do we need to order a quantity of 3 or 4 on advair?

## 2011-10-31 NOTE — Telephone Encounter (Signed)
I called [facility] and they do have the rx from 08-04-11 on file so I advised the pt needs a refill. Pt is aware that they have rx and to contact them for future refills. Carron Curie, CMA

## 2011-10-31 NOTE — Telephone Encounter (Signed)
We sent refill on 08-04-11 for #180 with 3 refills to [facility]. Has the pt called [facility] and requested refills or do we need to change the rx so he can get one free? ATC home number NA, no voicemail. [id number] on cell number.Carron Curie, CMA

## 2011-12-03 ENCOUNTER — Other Ambulatory Visit: Payer: Self-pay | Admitting: Pulmonary Disease

## 2012-02-06 ENCOUNTER — Ambulatory Visit: Payer: Medicare Other | Admitting: Pulmonary Disease

## 2012-02-13 ENCOUNTER — Ambulatory Visit: Payer: Medicare Other | Admitting: Pulmonary Disease

## 2012-04-10 ENCOUNTER — Encounter: Payer: Self-pay | Admitting: Pulmonary Disease

## 2012-04-10 ENCOUNTER — Ambulatory Visit (INDEPENDENT_AMBULATORY_CARE_PROVIDER_SITE_OTHER): Payer: BC Managed Care – PPO | Admitting: Pulmonary Disease

## 2012-04-10 VITALS — BP 130/78 | HR 96 | Temp 98.3°F | Ht 70.0 in | Wt 363.0 lb

## 2012-04-10 DIAGNOSIS — R059 Cough, unspecified: Secondary | ICD-10-CM

## 2012-04-10 DIAGNOSIS — J449 Chronic obstructive pulmonary disease, unspecified: Secondary | ICD-10-CM

## 2012-04-10 DIAGNOSIS — G4733 Obstructive sleep apnea (adult) (pediatric): Secondary | ICD-10-CM

## 2012-04-10 DIAGNOSIS — R05 Cough: Secondary | ICD-10-CM

## 2012-04-10 NOTE — Progress Notes (Signed)
  Subjective:    Patient ID: Jared [name], male    DOB: May 16, 1961, 51 y.o.   MRN: [id number]  HPI Patient comes in today for followup of his COPD, cough syncope secondary to ongoing smoking, as well as his obstructive sleep apnea.  He feels that his breathing is near his usual baseline, and has not had an acute exacerbation since the last visit.  He continues to have cough paroxysms with dizziness at times, but is also continuing to smoke 2 packs a day.  He is staying on his reflux medication.  The patient has been wearing CPAP compliantly, and feels the machine is working well for him.  He still has fragmented sleep because of a chronic pain syndrome that awakens him frequently during the night.  The patient has gained weight since the last visit.   Review of Systems  Constitutional: Positive for unexpected weight change. Negative for fever.  HENT: Positive for sinus pressure. Negative for ear pain, nosebleeds, congestion, sore throat, rhinorrhea, sneezing, trouble swallowing, dental problem and postnasal drip.   Eyes: Positive for redness and itching.  Respiratory: Positive for shortness of breath and wheezing. Negative for cough and chest tightness.   Cardiovascular: Negative for palpitations and leg swelling.  Gastrointestinal: Negative for nausea and vomiting.  Genitourinary: Negative for dysuria.  Musculoskeletal: Positive for joint swelling.  Skin: Negative for rash.  Neurological: Negative for headaches.  Hematological: Does not bruise/bleed easily.  Psychiatric/Behavioral: Negative for dysphoric mood. The patient is not nervous/anxious.        Objective:   Physical Exam Morbidly obese male in no acute distress Nose without purulence or discharge noted No skin breakdown or pressure necrosis from the CPAP mask Neck without lymphadenopathy or thyromegaly Chest with decreased breath sounds throughout, no wheezes Cardiac exam with regular rate and rhythm Lower extremities  with mild edema, no cyanosis Alert and oriented, does not appear to be sleepy, moves all 4 extremities.       Assessment & Plan:

## 2012-04-10 NOTE — Assessment & Plan Note (Signed)
The patient continues to have significant upper airway coughing with intermittent dizziness and syncope.  He is on medications for reflux, as well as a good bronchodilator regimen.  I think his persistent symptoms are due to excessive ongoing smoking, and I have told him that his cough will not resolve until he is able to completely quit.  I also think Advair irritated his upper airway, but the patient wishes to stay on this medication because of his improved breathing.  He did not see as good a therapeutic response with alternatives.

## 2012-04-10 NOTE — Assessment & Plan Note (Signed)
The patient continues to have dyspnea on exertion and cough, but surprisingly today he does not have air flow obstruction.  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

## 2012-04-10 NOTE — Patient Instructions (Addendum)
Stop smoking.  This is the key to resolving your cough. Stay on advair for now, but will hold off on adding back spiriva since your breathing tests were ok. Work on weight loss Stay on cpap, keep up with supplies and mask changes. followup with me in 6 mos.

## 2012-04-10 NOTE — Assessment & Plan Note (Signed)
The patient is compliant with his new CPAP, and feels he is doing well with the device.  I have stressed to him the importance of aggressive weight loss.

## 2012-04-11 ENCOUNTER — Telehealth: Payer: Self-pay | Admitting: Pulmonary Disease

## 2012-04-11 NOTE — Telephone Encounter (Signed)
Will forward to KC as an FYI 

## 2012-05-26 ENCOUNTER — Emergency Department (HOSPITAL_COMMUNITY): Payer: BC Managed Care – PPO

## 2012-05-26 ENCOUNTER — Emergency Department (HOSPITAL_COMMUNITY)
Admission: EM | Admit: 2012-05-26 | Discharge: 2012-05-26 | Disposition: A | Discharge status: Home / Self Care | Payer: BC Managed Care – PPO | Attending: Emergency Medicine | Admitting: Emergency Medicine

## 2012-05-26 ENCOUNTER — Encounter (HOSPITAL_COMMUNITY): Payer: Self-pay | Admitting: *Deleted

## 2012-05-26 DIAGNOSIS — K219 Gastro-esophageal reflux disease without esophagitis: Secondary | ICD-10-CM | POA: Insufficient documentation

## 2012-05-26 DIAGNOSIS — J438 Other emphysema: Secondary | ICD-10-CM | POA: Insufficient documentation

## 2012-05-26 DIAGNOSIS — M76899 Other specified enthesopathies of unspecified lower limb, excluding foot: Secondary | ICD-10-CM | POA: Insufficient documentation

## 2012-05-26 DIAGNOSIS — F411 Generalized anxiety disorder: Secondary | ICD-10-CM | POA: Insufficient documentation

## 2012-05-26 DIAGNOSIS — Z79899 Other long term (current) drug therapy: Secondary | ICD-10-CM | POA: Insufficient documentation

## 2012-05-26 DIAGNOSIS — M129 Arthropathy, unspecified: Secondary | ICD-10-CM | POA: Insufficient documentation

## 2012-05-26 DIAGNOSIS — J4489 Other specified chronic obstructive pulmonary disease: Secondary | ICD-10-CM | POA: Insufficient documentation

## 2012-05-26 DIAGNOSIS — F17200 Nicotine dependence, unspecified, uncomplicated: Secondary | ICD-10-CM | POA: Insufficient documentation

## 2012-05-26 DIAGNOSIS — I4510 Unspecified right bundle-branch block: Secondary | ICD-10-CM | POA: Insufficient documentation

## 2012-05-26 DIAGNOSIS — M7050 Other bursitis of knee, unspecified knee: Secondary | ICD-10-CM

## 2012-05-26 DIAGNOSIS — Z7982 Long term (current) use of aspirin: Secondary | ICD-10-CM | POA: Insufficient documentation

## 2012-05-26 DIAGNOSIS — Z791 Long term (current) use of non-steroidal anti-inflammatories (NSAID): Secondary | ICD-10-CM | POA: Insufficient documentation

## 2012-05-26 DIAGNOSIS — Z8739 Personal history of other diseases of the musculoskeletal system and connective tissue: Secondary | ICD-10-CM | POA: Insufficient documentation

## 2012-05-26 DIAGNOSIS — G4733 Obstructive sleep apnea (adult) (pediatric): Secondary | ICD-10-CM | POA: Insufficient documentation

## 2012-05-26 DIAGNOSIS — J449 Chronic obstructive pulmonary disease, unspecified: Secondary | ICD-10-CM | POA: Insufficient documentation

## 2012-05-26 MED ORDER — DICLOFENAC SODIUM 75 MG PO TBEC: 75.0000 mg | DELAYED_RELEASE_TABLET | Freq: Two times a day (BID) | ORAL | Status: DC

## 2012-05-26 MED ORDER — OXYCODONE-ACETAMINOPHEN 5-325 MG PO TABS: 1.0000 | ORAL_TABLET | Freq: Four times a day (QID) | ORAL | Status: DC | PRN

## 2012-05-26 NOTE — ED Provider Notes (Signed)
History  This chart was scribed for Charles B. Bernette Mayers, MD by Erskine Emery, ED Scribe. This patient was seen in room APA05/APA05 and the patient's care was started at 06:58.   CSN: [id number]  Arrival date & time 05/26/12  9147   First MD Initiated Contact with Patient 05/26/12 [phone]      Chief Complaint  Patient presents with  . Knee Pain    left knee pain. patient walking with a cane    (Consider location/radiation/quality/duration/timing/severity/associated sxs/prior treatment) The history is provided by the patient. No language interpreter was used.  Jared [name] is a 51 y.o. male who presents to the Emergency Department complaining of left knee pain for the past month. Pt denies any known injuries but reports he has had a few episodes of LOC (due to his chronic cough syncope) in the past month so it is possible he injured it without knowing. Pt denies any associated fevers and reports he has not yet seen anyone for the pain. Pt has been taking hydrocodone and ibuprofen with no relief from symptoms; using an ice pack on the knee provides only temporary relief. Pt reports the pain is aggravated by walking and is not relieved when laying down at rest. Pt has no h/o gout but does have a h/o arthritis for which he used to take Voltaren (75 mg x2/day), but he has since run out and has not taken it for a while. Pt also has a h/o obstructive sleep apnea, emphysema, right bundle branch block, back pain, anxiety, GERD, and COPD.  Dr. Lysbeth Galas is the pt's PCP, who he has not seen in over a year.  Past Medical History  Diagnosis Date  . OSA (obstructive sleep apnea)   . Tobacco abuse   . Emphysema   . RBBB (right bundle branch block with left anterior fascicular block)   . Syncope   . Anxiety   . Cough syncope   . Back pain   . GERD (gastroesophageal reflux disease)   . COPD (chronic obstructive pulmonary disease)     History reviewed. No pertinent past surgical history.  No family  history on file.  History  Substance Use Topics  . Smoking status: Current Every Day Smoker -- 2.0 packs/day for 40 years    Types: Cigarettes  . Smokeless tobacco: Never Used     Comment: started smoking at age 3.  currently smoking 1.5 ppd.  . Alcohol Use: Not on file      Review of Systems A complete 10 system review of systems was obtained and all systems are negative except as noted in the HPI and PMH.    Allergies  Review of patient's allergies indicates no known allergies.  Home Medications   Current Outpatient Rx  Name  Route  Sig  Dispense  Refill  . ASPIRIN 81 MG PO TABS   Oral   Take 81 mg by mouth daily.           Marland Kitchen CITALOPRAM HYDROBROMIDE 20 MG PO TABS   Oral   Take 20 mg by mouth daily.           Marland Kitchen DICLOFENAC SODIUM 75 MG PO TBEC   Oral   Take 75 mg by mouth 2 (two) times daily.           Marland Kitchen ESOMEPRAZOLE MAGNESIUM 40 MG PO CPDR   Oral   Take 40 mg by mouth 2 (two) times daily.           Marland Kitchen  FENOFIBRATE 160 MG PO TABS   Oral   Take 1 tablet by mouth daily.         Marland Kitchen FLUTICASONE-SALMETEROL 500-50 MCG/DOSE IN AEPB   Inhalation   Inhale 1 puff into the lungs 2 (two) times daily.   180 each   3   . HYDROCODONE-ACETAMINOPHEN 5-500 MG PO CAPS   Oral   Take 1 capsule by mouth every 6 (six) hours as needed.           Marland Kitchen PROAIR HFA 108 (90 BASE) MCG/ACT IN AERS      USE 1-2 PUFFS EVERY 4-6 HOURS AS NEEDED   8.5 g   1   . SIMVASTATIN 40 MG PO TABS   Oral   Take 40 mg by mouth 2 (two) times daily.            Triage Vitals: BP 136/70  Pulse 103  Temp 98.4 F (36.9 C) (Oral)  Resp 20  Ht 5\' 11"  (1.803 m)  Wt 360 lb (163.295 kg)  BMI 50.21 kg/m2  SpO2 98%  Physical Exam  Nursing note and vitals reviewed. Constitutional: He is oriented to person, place, and time. He appears well-developed and well-nourished.  HENT:  Head: Normocephalic and atraumatic.  Eyes: EOM are normal. Pupils are equal, round, and reactive to light.   Neck: Normal range of motion. Neck supple.  Cardiovascular: Normal rate, normal heart sounds and intact distal pulses.   Pulmonary/Chest: Effort normal and breath sounds normal.  Abdominal: Bowel sounds are normal. He exhibits no distension. There is no tenderness.  Musculoskeletal: Normal range of motion. He exhibits tenderness. He exhibits no edema.       Left knee is tender to palpation in medial aspect with sllight erythema and warmth but no effusion.  Neurological: He is alert and oriented to person, place, and time. He has normal strength. No cranial nerve deficit or sensory deficit.  Skin: Skin is warm and dry. No rash noted.  Psychiatric: He has a normal mood and affect.    ED Course  Procedures (including critical care time) DIAGNOSTIC STUDIES: Oxygen Saturation is 98% on room air, normal by my interpretation.    COORDINATION OF CARE: 07:10--I evaluated the patient and we discussed a treatment plan including knee x-ray and pain medication to which the pt agreed. I informed the pt that his symptoms could be due to his arthritis, which would explain the inflammation and erythema he asked about.   Labs Reviewed - No data to display Dg Knee Complete 4 Views Left  05/26/2012  *RADIOLOGY REPORT*  Clinical Data: Anterior knee pain.  LEFT KNEE - COMPLETE 4+ VIEW  Comparison: None.  Findings: Imaged bones, joints and soft tissues appear normal.  IMPRESSION: Negative exam.   Original Report Authenticated By: Holley Dexter, M.D.      No diagnosis found.    MDM  Xray neg for significant osteoarthritis, likely a bursitis. Will place in knee immobilizer, refill Voltaren and start oxycodone for breakthrough pain. PCP follow up.       I personally performed the services described in this documentation, which was scribed in my presence. The recorded information has been reviewed and is accurate.     Charles B. Bernette Mayers, MD 05/26/12 [phone]

## 2012-06-01 ENCOUNTER — Emergency Department (HOSPITAL_COMMUNITY)
Admission: EM | Admit: 2012-06-01 | Discharge: 2012-06-01 | Disposition: A | Discharge status: Home / Self Care | Payer: BC Managed Care – PPO | Attending: Emergency Medicine | Admitting: Emergency Medicine

## 2012-06-01 ENCOUNTER — Encounter (HOSPITAL_COMMUNITY): Payer: Self-pay | Admitting: *Deleted

## 2012-06-01 DIAGNOSIS — G8929 Other chronic pain: Secondary | ICD-10-CM | POA: Insufficient documentation

## 2012-06-01 DIAGNOSIS — R42 Dizziness and giddiness: Secondary | ICD-10-CM | POA: Insufficient documentation

## 2012-06-01 DIAGNOSIS — F17200 Nicotine dependence, unspecified, uncomplicated: Secondary | ICD-10-CM | POA: Insufficient documentation

## 2012-06-01 DIAGNOSIS — G4733 Obstructive sleep apnea (adult) (pediatric): Secondary | ICD-10-CM | POA: Insufficient documentation

## 2012-06-01 DIAGNOSIS — Z7982 Long term (current) use of aspirin: Secondary | ICD-10-CM | POA: Insufficient documentation

## 2012-06-01 DIAGNOSIS — F411 Generalized anxiety disorder: Secondary | ICD-10-CM | POA: Insufficient documentation

## 2012-06-01 DIAGNOSIS — Z79899 Other long term (current) drug therapy: Secondary | ICD-10-CM | POA: Insufficient documentation

## 2012-06-01 DIAGNOSIS — M549 Dorsalgia, unspecified: Secondary | ICD-10-CM | POA: Insufficient documentation

## 2012-06-01 DIAGNOSIS — J438 Other emphysema: Secondary | ICD-10-CM | POA: Insufficient documentation

## 2012-06-01 DIAGNOSIS — J449 Chronic obstructive pulmonary disease, unspecified: Secondary | ICD-10-CM | POA: Insufficient documentation

## 2012-06-01 DIAGNOSIS — J4489 Other specified chronic obstructive pulmonary disease: Secondary | ICD-10-CM | POA: Insufficient documentation

## 2012-06-01 DIAGNOSIS — K219 Gastro-esophageal reflux disease without esophagitis: Secondary | ICD-10-CM | POA: Insufficient documentation

## 2012-06-01 DIAGNOSIS — I452 Bifascicular block: Secondary | ICD-10-CM | POA: Insufficient documentation

## 2012-06-01 DIAGNOSIS — M25569 Pain in unspecified knee: Secondary | ICD-10-CM | POA: Insufficient documentation

## 2012-06-01 MED ORDER — OXYCODONE-ACETAMINOPHEN 5-325 MG PO TABS: 1.0000 | ORAL_TABLET | Freq: Four times a day (QID) | ORAL | Status: AC | PRN

## 2012-06-01 MED ORDER — HYDROMORPHONE HCL PF 2 MG/ML IJ SOLN
2.0000 mg | Freq: Once | INTRAMUSCULAR | Status: AC
  Administered 2012-06-01: 2 mg via INTRAMUSCULAR
  Filled 2012-06-01: qty 1

## 2012-06-01 NOTE — ED Notes (Signed)
Pt alert & oriented x4, stable gait. Patient  given discharge instructions, paperwork & prescription(s). Patient verbalized understanding. Pt left department w/ no further questions. 

## 2012-06-01 NOTE — ED Provider Notes (Signed)
History   This chart was scribed for Shelda Jakes, MD by Gerlean Ren, ED Scribe. This patient was seen in room APA18/APA18 and the patient's care was started at 10:43 PM    CSN: [id number]  Arrival date & time 06/01/12  2026   First MD Initiated Contact with Patient 06/01/12 2231      Chief Complaint  Patient presents with  . Knee Pain     The history is provided by the patient. No language interpreter was used.   Jared [name] is a 51 y.o. male with h/o RBBB, COPD, emphysema, and GERD who presents to the Emergency Department complaining of constant, gradually worsening, moderate, sharp, non-radiating left knee pain first noticed one month ago with no associated injury, fall, or trauma as cause. Pt was seen here 12/07 and given 20 tablets of 5-325mg  Percocet of which he states he has 4 tablets remaining.  Pt has not seen orthopedist since 12/07.  Pt states pain has gotten worse and reports mild increase in swelling since 12/07 and is now unable to bear any weight at all.  Pt states he had a brief fever today that has since resolved and had a brief spell of dizziness with no associated fall or syncope.  Pt is a current everyday smoker but denies alcohol use.  Pt wore knee immobilizer to room but is not wearing it during exam.   PCP is Dr. Lysbeth Galas. Past Medical History  Diagnosis Date  . OSA (obstructive sleep apnea)   . Tobacco abuse   . Emphysema   . RBBB (right bundle branch block with left anterior fascicular block)   . Syncope   . Anxiety   . Cough syncope   . Back pain   . GERD (gastroesophageal reflux disease)   . COPD (chronic obstructive pulmonary disease)     Past Surgical History  Procedure Date  . Hernia repair   . Surgery for sleep apnea     History reviewed. No pertinent family history.  History  Substance Use Topics  . Smoking status: Current Every Day Smoker -- 2.0 packs/day for 40 years    Types: Cigarettes  . Smokeless tobacco: Never Used   Comment: started smoking at age 47.  currently smoking 1.5 ppd.  . Alcohol Use: No      Review of Systems  Constitutional: Negative for fever.  HENT: Negative for congestion, sore throat, rhinorrhea and neck pain.   Respiratory: Negative for cough and shortness of breath.   Cardiovascular: Negative for chest pain and leg swelling.  Gastrointestinal: Negative for nausea, vomiting, abdominal pain and diarrhea.  Genitourinary: Negative for dysuria and frequency.  Musculoskeletal: Positive for back pain (chronic).       Knee pain.  Skin: Negative for rash.  Neurological: Positive for dizziness. Negative for headaches.  Hematological: Does not bruise/bleed easily.  Psychiatric/Behavioral: Negative for confusion.    Allergies  Review of patient's allergies indicates no known allergies.  Home Medications   Current Outpatient Rx  Name  Route  Sig  Dispense  Refill  . ALBUTEROL SULFATE HFA 108 (90 BASE) MCG/ACT IN AERS   Inhalation   Inhale 2 puffs into the lungs every 6 (six) hours as needed.         . ASPIRIN 81 MG PO TABS   Oral   Take 81 mg by mouth daily.           Marland Kitchen CITALOPRAM HYDROBROMIDE 20 MG PO TABS   Oral   Take  20 mg by mouth daily.           Marland Kitchen DICLOFENAC SODIUM 75 MG PO TBEC   Oral   Take 1 tablet (75 mg total) by mouth 2 (two) times daily.   60 tablet   0   . ESOMEPRAZOLE MAGNESIUM 40 MG PO CPDR   Oral   Take 40 mg by mouth 2 (two) times daily.           . FENOFIBRATE 160 MG PO TABS   Oral   Take 1 tablet by mouth daily.         Marland Kitchen FLUTICASONE-SALMETEROL 500-50 MCG/DOSE IN AEPB   Inhalation   Inhale 1 puff into the lungs 2 (two) times daily.   180 each   3   . HYDROCODONE-ACETAMINOPHEN 5-500 MG PO CAPS   Oral   Take 1 capsule by mouth every 6 (six) hours as needed.           . OXYCODONE-ACETAMINOPHEN 5-325 MG PO TABS   Oral   Take 1-2 tablets by mouth every 6 (six) hours as needed for pain.   20 tablet   0   . SIMVASTATIN 40 MG PO  TABS   Oral   Take 40 mg by mouth 2 (two) times daily.          . OXYCODONE-ACETAMINOPHEN 5-325 MG PO TABS   Oral   Take 1-2 tablets by mouth every 6 (six) hours as needed for pain.   20 tablet   0     BP 117/60  Pulse 85  Temp 98.2 F (36.8 C) (Oral)  Resp 18  Ht 5\' 11"  (1.803 m)  Wt 360 lb (163.295 kg)  BMI 50.21 kg/m2  SpO2 97%  Physical Exam  Nursing note and vitals reviewed. Constitutional: He is oriented to person, place, and time. He appears well-developed and well-nourished.  HENT:  Head: Normocephalic and atraumatic.  Eyes: Conjunctivae normal and EOM are normal.  Neck: Normal range of motion. No tracheal deviation present.  Cardiovascular: Normal rate, regular rhythm and normal heart sounds.   No murmur heard. Pulmonary/Chest: Effort normal and breath sounds normal. He has no wheezes.  Abdominal: Soft. Bowel sounds are normal. There is no tenderness.  Musculoskeletal: Normal range of motion. He exhibits no edema.       Left knee is warm with minimal redness and swelling. Right knee normal.  Neurological: He is alert and oriented to person, place, and time. No cranial nerve deficit. Coordination normal.  Skin: Skin is warm. No rash noted.  Psychiatric: He has a normal mood and affect.    ED Course  Procedures (including critical care time) DIAGNOSTIC STUDIES: Oxygen Saturation is 97% on room air, adequate by my interpretation.    COORDINATION OF CARE: 10:52 PM- Patient informed of clinical course, understands medical decision-making process, and agrees with plan.  Ordered   Labs Reviewed - No data to display No results found. Results for orders placed in visit on 04/10/12  SPIROMETRY W/ GRAPH      Component Value Range   .       Dg Knee Complete 4 Views Left  05/26/2012  *RADIOLOGY REPORT*  Clinical Data: Anterior knee pain.  LEFT KNEE - COMPLETE 4+ VIEW  Comparison: None.  Findings: Imaged bones, joints and soft tissues appear normal.  IMPRESSION:  Negative exam.   Original Report Authenticated By: Holley Dexter, M.D.       1. Knee pain  MDM    persistent pain and redness to the left knee. Visit from December 7 was reviewed x-rays from that time also reviewed. Suspect inflammatory changes in the left knee. No evidence of joint infection. We'll have patient continue knee immobilizer will renew pain medicine and followup with orthopedics. Also does have followup with primary care Dr. On Tuesday.     I personally performed the services described in this documentation, which was scribed in my presence. The recorded information has been reviewed and is accurate.          Shelda Jakes, MD 06/01/12 [phone]

## 2012-06-01 NOTE — ED Notes (Signed)
Pain lt knee for 1 month, seen here 12/7  Has not seen his md since here.  No known injury.  Pain is getting worse.  Wearing a KI

## 2012-07-04 ENCOUNTER — Telehealth: Payer: Self-pay | Admitting: Pulmonary Disease

## 2012-07-04 MED ORDER — ALBUTEROL SULFATE HFA 108 (90 BASE) MCG/ACT IN AERS: 2.0000 | INHALATION_SPRAY | Freq: Four times a day (QID) | RESPIRATORY_TRACT | Status: DC | PRN

## 2012-07-04 NOTE — Telephone Encounter (Signed)
Pt aware rx has been sent. Nothing further was needed 

## 2012-07-08 ENCOUNTER — Emergency Department (HOSPITAL_COMMUNITY): Payer: BC Managed Care – PPO

## 2012-07-08 ENCOUNTER — Observation Stay (HOSPITAL_COMMUNITY)
Admission: EM | Admit: 2012-07-08 | Discharge: 2012-07-09 | Disposition: A | Discharge status: Home / Self Care | Payer: BC Managed Care – PPO | Attending: Internal Medicine | Admitting: Internal Medicine

## 2012-07-08 ENCOUNTER — Encounter (HOSPITAL_COMMUNITY): Payer: Self-pay | Admitting: Emergency Medicine

## 2012-07-08 DIAGNOSIS — J4489 Other specified chronic obstructive pulmonary disease: Secondary | ICD-10-CM

## 2012-07-08 DIAGNOSIS — J449 Chronic obstructive pulmonary disease, unspecified: Secondary | ICD-10-CM

## 2012-07-08 DIAGNOSIS — E663 Overweight: Secondary | ICD-10-CM | POA: Insufficient documentation

## 2012-07-08 DIAGNOSIS — J438 Other emphysema: Secondary | ICD-10-CM | POA: Insufficient documentation

## 2012-07-08 DIAGNOSIS — K219 Gastro-esophageal reflux disease without esophagitis: Secondary | ICD-10-CM | POA: Diagnosis present

## 2012-07-08 DIAGNOSIS — F17200 Nicotine dependence, unspecified, uncomplicated: Secondary | ICD-10-CM | POA: Diagnosis present

## 2012-07-08 DIAGNOSIS — J439 Emphysema, unspecified: Secondary | ICD-10-CM | POA: Diagnosis present

## 2012-07-08 DIAGNOSIS — M549 Dorsalgia, unspecified: Principal | ICD-10-CM

## 2012-07-08 DIAGNOSIS — R0602 Shortness of breath: Secondary | ICD-10-CM | POA: Insufficient documentation

## 2012-07-08 DIAGNOSIS — G4733 Obstructive sleep apnea (adult) (pediatric): Secondary | ICD-10-CM

## 2012-07-08 DIAGNOSIS — R079 Chest pain, unspecified: Secondary | ICD-10-CM

## 2012-07-08 HISTORY — DX: Unspecified asthma, uncomplicated: J45.909

## 2012-07-08 LAB — LIPID PANEL
Cholesterol: 228 mg/dL — ABNORMAL HIGH (ref 0–200)
HDL: 32 mg/dL — ABNORMAL LOW (ref >39)
LDL Cholesterol: 121 mg/dL — ABNORMAL HIGH (ref 0–99)
Triglycerides: 375 mg/dL — ABNORMAL HIGH (ref <150)

## 2012-07-08 LAB — BASIC METABOLIC PANEL
BUN: 19 mg/dL (ref 6–23)
CO2: 25 mEq/L (ref 19–32)
Chloride: 103 mEq/L (ref 96–112)
GFR calc non Af Amer: >90 mL/min (ref >90)
Glucose, Bld: 97 mg/dL (ref 70–99)
Potassium: 4.2 mEq/L (ref 3.5–5.1)
Sodium: 139 mEq/L (ref 135–145)

## 2012-07-08 LAB — CBC WITH DIFFERENTIAL/PLATELET
Eosinophils Absolute: 0.1 10*3/uL (ref 0.0–0.7)
HCT: 43.6 % (ref 39.0–52.0)
Hemoglobin: 15.3 g/dL (ref 13.0–17.0)
Lymphs Abs: 2.4 10*3/uL (ref 0.7–4.0)
MCH: 32.8 pg (ref 26.0–34.0)
Monocytes Absolute: 0.7 10*3/uL (ref 0.1–1.0)
Monocytes Relative: 9 % (ref 3–12)
Neutrophils Relative %: 61 % (ref 43–77)
RBC: 4.67 MIL/uL (ref 4.22–5.81)

## 2012-07-08 LAB — TROPONIN I: Troponin I: <0.3 ng/mL (ref <0.30)

## 2012-07-08 MED ORDER — SODIUM CHLORIDE 0.9 % IJ SOLN: 3.0000 mL | Freq: Two times a day (BID) | INTRAMUSCULAR | Status: DC

## 2012-07-08 MED ORDER — ACETAMINOPHEN 325 MG PO TABS: 650.0000 mg | ORAL_TABLET | Freq: Four times a day (QID) | ORAL | Status: DC | PRN

## 2012-07-08 MED ORDER — HYDROCODONE-ACETAMINOPHEN 5-325 MG PO TABS
1.0000 | ORAL_TABLET | Freq: Four times a day (QID) | ORAL | Status: DC | PRN
  Administered 2012-07-08 – 2012-07-09 (×2): 2 via ORAL
  Filled 2012-07-08 (×2): qty 2

## 2012-07-08 MED ORDER — ONDANSETRON HCL 4 MG/2ML IJ SOLN: 4.0000 mg | Freq: Four times a day (QID) | INTRAMUSCULAR | Status: DC | PRN

## 2012-07-08 MED ORDER — PANTOPRAZOLE SODIUM 40 MG PO TBEC
40.0000 mg | DELAYED_RELEASE_TABLET | Freq: Every day | ORAL | Status: DC
  Administered 2012-07-08 – 2012-07-09 (×2): 40 mg via ORAL
  Filled 2012-07-08 (×2): qty 1

## 2012-07-08 MED ORDER — ENOXAPARIN SODIUM 40 MG/0.4ML ~~LOC~~ SOLN
40.0000 mg | SUBCUTANEOUS | Status: DC
  Administered 2012-07-08: 40 mg via SUBCUTANEOUS
  Filled 2012-07-08: qty 0.4

## 2012-07-08 MED ORDER — ASPIRIN 81 MG PO CHEW
162.0000 mg | CHEWABLE_TABLET | Freq: Once | ORAL | Status: AC
  Administered 2012-07-08: 162 mg via ORAL
  Filled 2012-07-08: qty 2

## 2012-07-08 MED ORDER — POTASSIUM CHLORIDE IN NACL 20-0.9 MEQ/L-% IV SOLN
INTRAVENOUS | Status: DC
  Administered 2012-07-08: 17:00:00 via INTRAVENOUS

## 2012-07-08 MED ORDER — DICLOFENAC SODIUM 75 MG PO TBEC
75.0000 mg | DELAYED_RELEASE_TABLET | Freq: Two times a day (BID) | ORAL | Status: DC
  Administered 2012-07-09: 75 mg via ORAL
  Filled 2012-07-08 (×6): qty 1

## 2012-07-08 MED ORDER — ASPIRIN 325 MG PO TABS: 162.0000 mg | ORAL_TABLET | Freq: Once | ORAL | Status: DC

## 2012-07-08 MED ORDER — ASPIRIN 81 MG PO TABS
81.0000 mg | ORAL_TABLET | Freq: Every day | ORAL | Status: DC
  Filled 2012-07-08 (×5): qty 1

## 2012-07-08 MED ORDER — CITALOPRAM HYDROBROMIDE 20 MG PO TABS
20.0000 mg | ORAL_TABLET | Freq: Every day | ORAL | Status: DC
  Administered 2012-07-08 – 2012-07-09 (×2): 20 mg via ORAL
  Filled 2012-07-08 (×2): qty 1

## 2012-07-08 MED ORDER — SIMVASTATIN 20 MG PO TABS
40.0000 mg | ORAL_TABLET | Freq: Every day | ORAL | Status: DC
  Administered 2012-07-08: 40 mg via ORAL
  Filled 2012-07-08: qty 2

## 2012-07-08 MED ORDER — ACETAMINOPHEN 650 MG RE SUPP: 650.0000 mg | Freq: Four times a day (QID) | RECTAL | Status: DC | PRN

## 2012-07-08 MED ORDER — MOMETASONE FURO-FORMOTEROL FUM 200-5 MCG/ACT IN AERO
2.0000 | INHALATION_SPRAY | Freq: Two times a day (BID) | RESPIRATORY_TRACT | Status: DC
  Filled 2012-07-08: qty 8.8

## 2012-07-08 MED ORDER — ALBUTEROL SULFATE (5 MG/ML) 0.5% IN NEBU: 2.5000 mg | INHALATION_SOLUTION | RESPIRATORY_TRACT | Status: DC | PRN

## 2012-07-08 MED ORDER — MORPHINE SULFATE 2 MG/ML IJ SOLN: 2.0000 mg | INTRAMUSCULAR | Status: DC | PRN

## 2012-07-08 MED ORDER — ALBUTEROL SULFATE HFA 108 (90 BASE) MCG/ACT IN AERS: 2.0000 | INHALATION_SPRAY | Freq: Four times a day (QID) | RESPIRATORY_TRACT | Status: DC | PRN

## 2012-07-08 MED ORDER — ONDANSETRON HCL 4 MG PO TABS: 4.0000 mg | ORAL_TABLET | Freq: Four times a day (QID) | ORAL | Status: DC | PRN

## 2012-07-08 NOTE — ED Notes (Signed)
Pt c/o intermittent spells on central cp radiating down left arm since last Wednesday.

## 2012-07-08 NOTE — ED Provider Notes (Signed)
History  This chart was scribed for Joya Gaskins, MD by Erskine Emery, ED Scribe. This patient was seen in room APA14/APA14 and the patient's care was started at 08:35.    CSN: [id number]  Arrival date & time 07/08/12  0800   None     Chief Complaint  Patient presents with  . Chest Pain    The history is provided by the patient. No language interpreter was used.  Jared [name] is a 52 y.o. male who presents to the Emergency Department complaining of sudden onset, gradually worsening intermittent central chest pain that radiates down the left arm in episodes of about 15 minutes since last Wednesday. Pt reports the pain begins in his back, around the shoulder blades then comes around to settle in the chest. It always starts when he is laying down and is relieved by sitting up and standing up. He has had the episodes at least once/day every day since the onset. Pt reports some associated hot spellls, diaphoresis, SOB, chest tightness, and coughing, but denies any emesis, weakness in arms or legs, new abdominal pain, pain with deep breathing, or neck pain. He has been taking 2 baby aspirin every episode, which he takes every day anyways. His last dose was this morning. Pt denies ever having pain like this before. His spouse reports he has been under stress lately with new knee pain and family problems. Pt reports a strong family h/o heart disease.  His   Pt has a h/o right bundle branch block with left anterior fascicular block, COPD, obstructive sleep apnea, emphysema, cough syncope, GERD, possibly broken rib, and back pain. He has been smoking about 2 packs/day for 40 years.  Past Medical History  Diagnosis Date  . OSA (obstructive sleep apnea)   . Tobacco abuse   . Emphysema   . RBBB (right bundle branch block with left anterior fascicular block)   . Syncope   . Anxiety   . Cough syncope   . Back pain   . GERD (gastroesophageal reflux disease)   . COPD (chronic obstructive  pulmonary disease)     Past Surgical History  Procedure Date  . Hernia repair   . Surgery for sleep apnea     No family history on file.  History  Substance Use Topics  . Smoking status: Current Every Day Smoker -- 2.0 packs/day for 40 years    Types: Cigarettes  . Smokeless tobacco: Never Used     Comment: started smoking at age 72.  currently smoking 1.5 ppd.  . Alcohol Use: No      Review of Systems  Constitutional: Positive for diaphoresis. Negative for fever.  HENT: Negative for neck pain.   Respiratory: Positive for cough, chest tightness and shortness of breath.   Cardiovascular: Positive for chest pain.  Musculoskeletal: Positive for back pain.  All other systems reviewed and are negative.    Allergies  Review of patient's allergies indicates no known allergies.  Home Medications   Current Outpatient Rx  Name  Route  Sig  Dispense  Refill  . ALBUTEROL SULFATE HFA 108 (90 BASE) MCG/ACT IN AERS   Inhalation   Inhale 2 puffs into the lungs every 6 (six) hours as needed.   3 Inhaler   1   . ASPIRIN 81 MG PO TABS   Oral   Take 81 mg by mouth daily.           Marland Kitchen CITALOPRAM HYDROBROMIDE 20 MG PO TABS  Oral   Take 20 mg by mouth daily.           Marland Kitchen DICLOFENAC SODIUM 75 MG PO TBEC   Oral   Take 1 tablet (75 mg total) by mouth 2 (two) times daily.   60 tablet   0   . ESOMEPRAZOLE MAGNESIUM 40 MG PO CPDR   Oral   Take 40 mg by mouth 2 (two) times daily.           . FENOFIBRATE 160 MG PO TABS   Oral   Take 1 tablet by mouth daily.         Marland Kitchen FLUTICASONE-SALMETEROL 500-50 MCG/DOSE IN AEPB   Inhalation   Inhale 1 puff into the lungs 2 (two) times daily.   180 each   3   . HYDROCODONE-ACETAMINOPHEN 5-500 MG PO CAPS   Oral   Take 1 capsule by mouth every 6 (six) hours as needed.           . OXYCODONE-ACETAMINOPHEN 5-325 MG PO TABS   Oral   Take 1-2 tablets by mouth every 6 (six) hours as needed for pain.   20 tablet   0   .  SIMVASTATIN 40 MG PO TABS   Oral   Take 40 mg by mouth 2 (two) times daily.            BP 115/68  Pulse 80  Temp 98 F (36.7 C)  Resp 17  Ht 5\' 11"  (1.803 m)  Wt 360 lb (163.295 kg)  BMI 50.21 kg/m2  SpO2 94%  Physical Exam CONSTITUTIONAL: Well developed/well nourished HEAD AND FACE: Normocephalic/atraumatic EYES: EOMI/PERRL ENMT: Mucous membranes moist NECK: supple no meningeal signs SPINE:entire spine nontender, no tenderness to palpation of his back CV: S1/S2 noted, no murmurs/rubs/gallops noted LUNGS: Lungs are clear to auscultation bilaterally, no apparent distress ABDOMEN: soft, nontender, no rebound or guarding, obese GU:no cva tenderness NEURO: Pt is awake/alert, moves all extremitiesx4 EXTREMITIES: pulses normal, full ROM, no calf tenderness SKIN: warm, color normal PSYCH: no abnormalities of mood noted   ED Course  Procedures  DIAGNOSTIC STUDIES: Oxygen Saturation is 95% on room air, normal by my interpretation.    COORDINATION OF CARE: 08:35--I evaluated the patient and we discussed a treatment plan including chest x-ray, blood work, aspirin, and EKG to which the pt agreed. Pt's blood pressure is 151/76 right now, but his wife reports it is usually around 118/42.  11:07--I rechecked the pt. I told him he shows no signs of massive heart attack but that I am still concerned with some of his symptoms and am considering admitting him to see cardiology. Dr. Lysbeth Galas is the pt's PCP.  11:20--Consult to admit the pt. D/w dr Kerry Hough to tele OBS I doubt PE/Dissection at this time. No obvious signs of pericarditis  Would benefit from monitoring.  He already took 2 baby ASA today, and we gave another 2 ASA Currently pain free at this time   Results for orders placed during the hospital encounter of 07/08/12  BASIC METABOLIC PANEL      Component Value Range   Sodium 139  135 - 145 mEq/L   Potassium 4.2  3.5 - 5.1 mEq/L   Chloride 103  96 - 112 mEq/L   CO2 25  19 -  32 mEq/L   Glucose, Bld 97  70 - 99 mg/dL   BUN 19  6 - 23 mg/dL   Creatinine, Ser 8.65  0.50 - 1.35 mg/dL   Calcium 9.1  8.4 - 10.5 mg/dL   GFR calc non Af Amer >90  >90 mL/min   GFR calc Af Amer >90  >90 mL/min  CBC WITH DIFFERENTIAL      Component Value Range   WBC 8.3  4.0 - 10.5 K/uL   RBC 4.67  4.22 - 5.81 MIL/uL   Hemoglobin 15.3  13.0 - 17.0 g/dL   HCT 08.6  57.8 - 46.9 %   MCV 93.4  78.0 - 100.0 fL   MCH 32.8  26.0 - 34.0 pg   MCHC 35.1  30.0 - 36.0 g/dL   RDW 62.9  52.8 - 41.3 %   Platelets 251  150 - 400 K/uL   Neutrophils Relative 61  43 - 77 %   Neutro Abs 5.0  1.7 - 7.7 K/uL   Lymphocytes Relative 29  12 - 46 %   Lymphs Abs 2.4  0.7 - 4.0 K/uL   Monocytes Relative 9  3 - 12 %   Monocytes Absolute 0.7  0.1 - 1.0 K/uL   Eosinophils Relative 1  0 - 5 %   Eosinophils Absolute 0.1  0.0 - 0.7 K/uL   Basophils Relative 1  0 - 1 %   Basophils Absolute 0.0  0.0 - 0.1 K/uL  TROPONIN I      Component Value Range   Troponin I <0.30  <0.30 ng/mL   Dg Chest 2 View  07/08/2012  *RADIOLOGY REPORT*  Clinical Data: Chest pain  CHEST - 2 VIEW  Comparison: Prior chest x-ray 09/20/2009  Findings: The lungs are clear.  No pleural effusion, pneumothorax or focal airspace consolidation. Cardiac and mediastinal contours within normal limits.  No edema.  No significant interval change in the appearance of the chest compared to 09/20/2009.  Perceive left basilar opacity most consistent with superimposition of overlying soft tissues.  No acute osseous abnormality.  IMPRESSION: No acute cardiopulmonary disease.   Original Report Authenticated By: Malachy Moan, M.D.          MDM   Nursing notes including past medical history and social history reviewed and considered in documentation xrays reviewed and considered Labs/vital reviewed and considered     Date: 07/08/2012  Rate: 86  Rhythm: normal sinus rhythm  QRS Axis: normal  Intervals: normal  ST/T Wave abnormalities:  inverted t waves noted laterally  Conduction Disutrbances:nonspecific intraventricular conduction delay  Narrative Interpretation:   Old EKG Reviewed: changes noted and inverted t waves now noted     I personally performed the services described in this documentation, which was scribed in my presence. The recorded information has been reviewed and is accurate.      Joya Gaskins, MD 07/08/12 1130

## 2012-07-08 NOTE — H&P (Signed)
[facility] History and Physical  Jared [name] JXB:[id number] DOB: 26-May-1961 DOA: 07/08/2012  Referring physician: Dr. Bebe Shaggy PCP: Josue Hector, MD  Specialists: Neurosurgery: Dr. Coletta Memos  Chief Complaint: back pain  HPI: Jared [name] is a 52 y.o. male with multiple medical problems, who presents to the ED with complaints of back pain. He reports onset of pain originating last week between his shoulder blades and radiating to his left shoulder and left arm. Pain is shooting in nature. This pain occurs when he is laying down and resolves while he is sitting up.  There is no other correlation to activity.  No aggravating or relieving factors. He is chronically short of breath and has not noted any changes in this.  He feels like he may have had a fever last week, with sinus congestion. There was some mention of the pain possibly radiating into his chest, therefore patient was referred for admission.  Review of Systems: Pertinent positives as per HPI, otherwise negative  Past Medical History  Diagnosis Date  . OSA (obstructive sleep apnea)   . Tobacco abuse   . Emphysema   . RBBB (right bundle branch block with left anterior fascicular block)   . Syncope   . Anxiety   . Cough syncope   . Back pain   . GERD (gastroesophageal reflux disease)   . COPD (chronic obstructive pulmonary disease)   . High cholesterol   . Renal disorder   . Asthma    Past Surgical History  Procedure Date  . Hernia repair   . Surgery for sleep apnea    Social History:  reports that he has been smoking Cigarettes.  He has a 80 pack-year smoking history. He has never used smokeless tobacco. He reports that he does not drink alcohol or use illicit drugs. Ambulates with cane, but is not very mobile since his recent knee pain  No Known Allergies  Family History: positive for premature coronary disease.  His brother needed bypass surgery in his 30s.  Prior to Admission  medications   Medication Sig Start Date End Date Taking? Authorizing Provider  albuterol (PROAIR HFA) 108 (90 BASE) MCG/ACT inhaler Inhale 2 puffs into the lungs every 6 (six) hours as needed. 07/04/12  Yes Barbaraann Share, MD  aspirin 81 MG tablet Take 81 mg by mouth daily.     Yes Historical Provider, MD  citalopram (CELEXA) 20 MG tablet Take 20 mg by mouth daily.     Yes Historical Provider, MD  diclofenac (VOLTAREN) 75 MG EC tablet Take 1 tablet (75 mg total) by mouth 2 (two) times daily. 05/26/12  Yes Charles B. Bernette Mayers, MD  esomeprazole (NEXIUM) 40 MG capsule Take 40 mg by mouth 2 (two) times daily.     Yes Historical Provider, MD  Fluticasone-Salmeterol (ADVAIR DISKUS) 500-50 MCG/DOSE AEPB Inhale 1 puff into the lungs 2 (two) times daily. 08/04/11 08/03/12 Yes Barbaraann Share, MD  HYDROcodone-acetaminophen (VICODIN) 5-500 MG per tablet Take 1 tablet by mouth every 8 (eight) hours as needed. Pain.   Yes Historical Provider, MD  simvastatin (ZOCOR) 40 MG tablet Take 40 mg by mouth at bedtime.  03/30/11  Yes Historical Provider, MD  Tetrahydrozoline HCl (VISINE OP) Apply 1 drop to eye daily as needed. Dry, itchy eyes.   Yes Historical Provider, MD   Physical Exam: Filed Vitals:   07/08/12 1200 07/08/12 1300 07/08/12 1413 07/08/12 1422  BP: 132/76 124/75 126/78 126/78  Pulse:  81 82 82  Temp:  98 F (36.7 C) 98 F (36.7 C)  TempSrc:   Oral Oral  Resp: 12 14 16 16   Height:   5\' 11"  (1.803 m) 5\' 11"  (1.803 m)  Weight:   163.295 kg (360 lb) 163.3 kg (360 lb 0.2 oz)  SpO2: 95% 95% 98% 98%     General:  NAD  Eyes: PERRLA  ENT: moist mucous membranes  Neck: supple  Cardiovascular: s1, s2, rrr  Respiratory: cta b  Abdomen: soft, nt, obese, bs+  Skin: deferred  Musculoskeletal: deferred  Psychiatric: normal affect, cooperative with exam  Neurologic: grossly intact, non focal, strength is equal in upper extremities.  Labs on Admission:  Basic Metabolic Panel:  Lab 07/08/12  0844  NA 139  K 4.2  CL 103  CO2 25  GLUCOSE 97  BUN 19  CREATININE 0.99  CALCIUM 9.1  MG --  PHOS --   Liver Function Tests: No results found for this basename: AST:5,ALT:5,ALKPHOS:5,BILITOT:5,PROT:5,ALBUMIN:5 in the last 168 hours No results found for this basename: LIPASE:5,AMYLASE:5 in the last 168 hours No results found for this basename: AMMONIA:5 in the last 168 hours CBC:  Lab 07/08/12 0844  WBC 8.3  NEUTROABS 5.0  HGB 15.3  HCT 43.6  MCV 93.4  PLT 251   Cardiac Enzymes:  Lab 07/08/12 0844  CKTOTAL --  CKMB --  CKMBINDEX --  TROPONINI <0.30    BNP (last 3 results) No results found for this basename: PROBNP:3 in the last 8760 hours CBG: No results found for this basename: GLUCAP:5 in the last 168 hours  Radiological Exams on Admission: Dg Chest 2 View  07/08/2012  *RADIOLOGY REPORT*  Clinical Data: Chest pain  CHEST - 2 VIEW  Comparison: Prior chest x-ray 09/20/2009  Findings: The lungs are clear.  No pleural effusion, pneumothorax or focal airspace consolidation. Cardiac and mediastinal contours within normal limits.  No edema.  No significant interval change in the appearance of the chest compared to 09/20/2009.  Perceive left basilar opacity most consistent with superimposition of overlying soft tissues.  No acute osseous abnormality.  IMPRESSION: No acute cardiopulmonary disease.   Original Report Authenticated By: Malachy Moan, M.D.     EKG: Independently reviewed. RBBB (old)  Assessment/Plan Principal Problem:  *Back pain Active Problems:  Overweight  TOBACCO ABUSE  OBSTRUCTIVE SLEEP APNEA  Chronic asthmatic bronchitis  GERD   1. Chest/back pain.  This appears to be very atypical. Patient will be monitored on telemetry, cycle cardiac markers, check lipid panel, d dimer, and tsh.  I suspect that this pain is likely caused by a thoracic radiculopathy. If patient rules out for any acute process, he wishes to pursue further work up of  radiculopathy with his neurosurgeon. I think this is reasonable. We will also set him up to follow up with cardiology as an outpatient. 2. GERD.  Continue PPI 3. OSA. Continue CPAP 4. Chronic asthmatic bronchitis.  Appears stable at this time, no signs of wheezing.   Code Status: Full code Family Communication: discussed with patient and wife at bedside Disposition Plan: probable discharge home tomorrow  Time spent:  Aryella Besecker [facility] Pager [phone]  If 7PM-7AM, please contact night-coverage [email or web address] Password [facility] 07/08/2012, 4:34 PM

## 2012-07-09 LAB — BASIC METABOLIC PANEL
BUN: 19 mg/dL (ref 6–23)
Chloride: 103 mEq/L (ref 96–112)
Creatinine, Ser: 0.9 mg/dL (ref 0.50–1.35)
GFR calc Af Amer: >90 mL/min (ref >90)
Glucose, Bld: 97 mg/dL (ref 70–99)
Potassium: 4.4 mEq/L (ref 3.5–5.1)

## 2012-07-09 LAB — TROPONIN I: Troponin I: <0.3 ng/mL (ref <0.30)

## 2012-07-09 LAB — CBC
HCT: 45.3 % (ref 39.0–52.0)
Hemoglobin: 15.6 g/dL (ref 13.0–17.0)
MCH: 32 pg (ref 26.0–34.0)
MCHC: 34.4 g/dL (ref 30.0–36.0)
MCV: 92.8 fL (ref 78.0–100.0)
RDW: 13.7 % (ref 11.5–15.5)

## 2012-07-09 MED ORDER — ASPIRIN EC 81 MG PO TBEC
81.0000 mg | DELAYED_RELEASE_TABLET | Freq: Every day | ORAL | Status: DC
  Administered 2012-07-09: 81 mg via ORAL

## 2012-07-09 MED ORDER — ENOXAPARIN SODIUM 80 MG/0.8ML ~~LOC~~ SOLN: 80.0000 mg | SUBCUTANEOUS | Status: DC

## 2012-07-09 NOTE — Plan of Care (Signed)
Problem: Discharge Progression Outcomes Goal: Complications resolved/controlled Outcome: Completed/Met Date Met:  07/09/12 No chest pain low back pain controled with pain med Goal: Other Discharge Outcomes/Goals Outcome: Completed/Met Date Met:  07/09/12 Discharged to [facility] with spouse

## 2012-07-09 NOTE — Discharge Summary (Signed)
Physician Discharge Summary  Jared [name] ZOX:[id number] DOB: 1960-10-12 DOA: 07/08/2012  PCP: Josue Hector, MD Neurosurgeon: Dr. Franky Macho  Admit date: 07/08/2012 Discharge date: 07/09/2012  Time spent: 40 minutes  Recommendations for Outpatient Follow-up:  1. Patient has been scheduled for outpatient cardiology follow up on 07/12/12 2. He will scheduled follow up with his neurosurgeon for back pain 3. He will follow up with his primary care physician in 2 weeks  Discharge Diagnoses:  Principal Problem:  *Back painm, likely related to a radiculopathy Active Problems:  Overweight  TOBACCO ABUSE  OBSTRUCTIVE SLEEP APNEA  Chronic asthmatic bronchitis  GERD   Discharge Condition: improved  Diet recommendation: low salt  Filed Weights   07/08/12 0807 07/08/12 1413 07/08/12 1422  Weight: 163.295 kg (360 lb) 163.295 kg (360 lb) 163.3 kg (360 lb 0.2 oz)    History of present illness:  Jared [name] is a 52 y.o. male with multiple medical problems, who presents to the ED with complaints of back pain. He reports onset of pain originating last week between his shoulder blades and radiating to his left shoulder and left arm. Pain is shooting in nature. This pain occurs when he is laying down and resolves while he is sitting up. There is no other correlation to activity. No aggravating or relieving factors. He is chronically short of breath and has not noted any changes in this. He feels like he may have had a fever last week, with sinus congestion. There was some mention of the pain possibly radiating into his chest, therefore patient was referred for admission.   Hospital Course:  This gentleman was admitted to the hospital for pain that was originating between the shoulder blades and radiating to his left shoulder, there was question of possible radiation to his chest as well. Due to patient's multiple risk factors for coronary disease he was admitted for rule out ACS. He  was monitored on telemetry overnight did not have any acute findings. EKG was unremarkable for acute changes. Cardiac enzymes are negative. He has not had any further pain in the hospital. It is felt appropriate that he followup with with our cardiologist as an outpatient and an appointment has been set up. Due to his multiple risk factors for coronary disease, outpatient stress test may be reasonable.  Patient's back pain may possibly be a radicular pain from his thoracic vertebra. It is possible that he has a herniated disc there. He describes pain worse while lying down and eased by sitting up. The pain is sharp pain that radiates to his left arm and down to his left hand. He reports having been told that he has problems in his thoracic vertebra. He has seen Dr. Franky Macho last year for back issues. We will ask that he follow up with neurosurgery again to discuss further workup/intervention. At this time, his strength is equal in both upper extremities and he does not have any neurologic symptoms at this point.  Patient has significant hyperlipidemia. He reports that he's been off his statin for almost a year and just restarted that approximately 2 days ago. We will request that his primary care physician followup with a repeat lipid panel in 3-4 months.  Procedures:  none  Consultations:  none  Discharge Exam: Filed Vitals:   07/08/12 1919 07/08/12 2027 07/09/12 0550 07/09/12 0912  BP:  101/67 159/93 131/71  Pulse: 78 75 71 79  Temp:  97.8 F (36.6 C) 98.1 F (36.7 C)   TempSrc:  Oral  Oral   Resp: 16 20 20    Height:      Weight:      SpO2: 97% 95% 94%     General: NAD Cardiovascular: S1, s2, rrr Respiratory: CTA B  Discharge Instructions  Discharge Orders    Future Appointments: Provider: Department: Dept Phone: Center:   07/12/2012 1:45 PM Wendall Stade, MD [facility] [place] [phone] [id number]   10/09/2012 11:00 AM Barbaraann Share, MD [facility]  [phone] None     Future Orders Please Complete By Expires   Diet - low sodium heart healthy      Increase activity slowly      Call MD for:  severe uncontrolled pain      Call MD for:  difficulty breathing, headache or visual disturbances      Call MD for:  temperature >100.4          Medication List     As of 07/09/2012 12:17 PM    TAKE these medications         albuterol 108 (90 BASE) MCG/ACT inhaler   Commonly known as: PROVENTIL HFA;VENTOLIN HFA   Inhale 2 puffs into the lungs every 6 (six) hours as needed.      aspirin 81 MG tablet   Take 81 mg by mouth daily.      citalopram 20 MG tablet   Commonly known as: CELEXA   Take 20 mg by mouth daily.      diclofenac 75 MG EC tablet   Commonly known as: VOLTAREN   Take 1 tablet (75 mg total) by mouth 2 (two) times daily.      esomeprazole 40 MG capsule   Commonly known as: NEXIUM   Take 40 mg by mouth 2 (two) times daily.      Fluticasone-Salmeterol 500-50 MCG/DOSE Aepb   Commonly known as: ADVAIR   Inhale 1 puff into the lungs 2 (two) times daily.      HYDROcodone-acetaminophen 5-500 MG per tablet   Commonly known as: VICODIN   Take 1 tablet by mouth every 8 (eight) hours as needed. Pain.      simvastatin 40 MG tablet   Commonly known as: ZOCOR   Take 40 mg by mouth at bedtime.      VISINE OP   Apply 1 drop to eye daily as needed. Dry, itchy eyes.           Follow-up Information    Follow up with Josue Hector, MD. Schedule an appointment as soon as possible for a visit in 2 weeks.   Contact information:   [address] [facility] [address] [phone]       Follow up with CABBELL,KYLE L, MD. (1-2 weeks)    Contact information:   [address]                         [address] [place] [address] [phone]       Follow up with Charlton Haws, MD. On 07/12/2012. (1:45pm)    Contact information:   Ground floor [facility] clinic           The  results of significant diagnostics from this hospitalization (including imaging, microbiology, ancillary and laboratory) are listed below for reference.    Significant Diagnostic Studies: Dg Chest 2 View  07/08/2012  *RADIOLOGY REPORT*  Clinical Data: Chest pain  CHEST - 2 VIEW  Comparison: Prior chest x-ray  09/20/2009  Findings: The lungs are clear.  No pleural effusion, pneumothorax or focal airspace consolidation. Cardiac and mediastinal contours within normal limits.  No edema.  No significant interval change in the appearance of the chest compared to 09/20/2009.  Perceive left basilar opacity most consistent with superimposition of overlying soft tissues.  No acute osseous abnormality.  IMPRESSION: No acute cardiopulmonary disease.   Original Report Authenticated By: Malachy Moan, M.D.     Microbiology: No results found for this or any previous visit (from the past 240 hour(s)).   Labs: Basic Metabolic Panel:  Lab 07/09/12 1610 07/08/12 0844  NA 138 139  K 4.4 4.2  CL 103 103  CO2 24 25  GLUCOSE 97 97  BUN 19 19  CREATININE 0.90 0.99  CALCIUM 9.2 9.1  MG -- --  PHOS -- --   Liver Function Tests: No results found for this basename: AST:5,ALT:5,ALKPHOS:5,BILITOT:5,PROT:5,ALBUMIN:5 in the last 168 hours  Lab 07/08/12 1658  LIPASE 44  AMYLASE --   No results found for this basename: AMMONIA:5 in the last 168 hours CBC:  Lab 07/09/12 0520 07/08/12 0844  WBC 9.1 8.3  NEUTROABS -- 5.0  HGB 15.6 15.3  HCT 45.3 43.6  MCV 92.8 93.4  PLT 241 251   Cardiac Enzymes:  Lab 07/09/12 0520 07/08/12 2217 07/08/12 0844  CKTOTAL -- -- --  CKMB -- -- --  CKMBINDEX -- -- --  TROPONINI <0.30 <0.30 <0.30   BNP: BNP (last 3 results) No results found for this basename: PROBNP:3 in the last 8760 hours CBG: No results found for this basename: GLUCAP:5 in the last 168 hours     Signed:  Jadamarie Butson  [facility] 07/09/2012, 12:17 PM

## 2012-07-09 NOTE — Progress Notes (Signed)
UR Chart Review Completed  

## 2012-07-11 ENCOUNTER — Other Ambulatory Visit: Payer: Self-pay | Admitting: Pulmonary Disease

## 2012-07-11 MED ORDER — FLUTICASONE-SALMETEROL 500-50 MCG/DOSE IN AEPB: 1.0000 | INHALATION_SPRAY | Freq: Two times a day (BID) | RESPIRATORY_TRACT | Status: DC

## 2012-07-12 ENCOUNTER — Ambulatory Visit (INDEPENDENT_AMBULATORY_CARE_PROVIDER_SITE_OTHER): Payer: BC Managed Care – PPO | Admitting: Cardiovascular Disease

## 2012-07-12 ENCOUNTER — Encounter: Payer: Self-pay | Admitting: Cardiovascular Disease

## 2012-07-12 VITALS — BP 129/77 | HR 95 | Ht 71.0 in | Wt 367.0 lb

## 2012-07-12 DIAGNOSIS — F17200 Nicotine dependence, unspecified, uncomplicated: Secondary | ICD-10-CM

## 2012-07-12 DIAGNOSIS — R079 Chest pain, unspecified: Secondary | ICD-10-CM

## 2012-07-12 DIAGNOSIS — M549 Dorsalgia, unspecified: Secondary | ICD-10-CM

## 2012-07-12 DIAGNOSIS — E663 Overweight: Secondary | ICD-10-CM

## 2012-07-12 NOTE — Assessment & Plan Note (Signed)
Discusse low carb diet Activity limited by back pain.

## 2012-07-12 NOTE — Assessment & Plan Note (Signed)
Atypical related to thoracic spine.  Now resolved Patient does not want chemical stress test Symptoms dont warrant cath.  Observe

## 2012-07-12 NOTE — Assessment & Plan Note (Signed)
Counseled for less than 10 minutes no motivation to totally quit.  Contnue E-cig

## 2012-07-12 NOTE — Assessment & Plan Note (Signed)
F/U Dr Fabiola Backer PT/OT pain meds  Will not do well at this weight

## 2012-07-12 NOTE — Progress Notes (Signed)
Patient ID: IDUS RATHKE, male   DOB: 1960-10-22, 52 y.o.   MRN: [id number] 52 yo referred post hospital  Reviewed his recent stay.  Admitted with more back pain  Chronic Sees Dr Fabiola Backer.  Pain radiated to front into chest.  R/O ECG with nonspecific latera T wave changes.  He had what sounds like a chemical myovue many years ago at [facility] "and almost died"  He is not willing to have another chemical stress test even with dobutamine.  Has COPD/asthma and sees Dr Shelle Iron smokes 2ppd and only recently tried to cut back with E-cigs.  He is morbidly obese.  Activity limited by back pain.  Discussed options with patient.  His pain really appears to have started in his back.  Clinical symptoms don't warrant cath and he is not willing to be risk stratified with chemical He is too obese for cardiac CT.  He is comfortable with observation and reevaluation if symptoms recur or change  ROS: Denies fever, malais, weight loss, blurry vision, decreased visual acuity, cough, sputum, SOB, hemoptysis, pleuritic pain, palpitaitons, heartburn, abdominal pain, melena, lower extremity edema, claudication, or rash.  All other systems reviewed and negative   General: Affect appropriate Obese COPDer HEENT: normal Neck supple with no adenopathy JVP normal no bruits no thyromegaly Lungs clear with no wheezing and good diaphragmatic motion Heart:  S1/S2 no murmur,rub, gallop or click PMI normal Abdomen: benighn, BS positve, no tenderness, no AAA no bruit.  No HSM or HJR Distal pulses intact with no bruits No edema Neuro non-focal Skin warm and dry No muscular weakness  Medications Current Outpatient Prescriptions  Medication Sig Dispense Refill  . albuterol (PROAIR HFA) 108 (90 BASE) MCG/ACT inhaler Inhale 2 puffs into the lungs every 6 (six) hours as needed.  3 Inhaler  1  . aspirin 81 MG tablet Take 81 mg by mouth daily.        . diclofenac (VOLTAREN) 75 MG EC tablet Take 1 tablet (75 mg total) by mouth 2  (two) times daily.  60 tablet  0  . esomeprazole (NEXIUM) 40 MG capsule Take 40 mg by mouth 2 (two) times daily.        . Fluticasone-Salmeterol (ADVAIR DISKUS) 500-50 MCG/DOSE AEPB Inhale 1 puff into the lungs 2 (two) times daily.  180 each  3  . oxyCODONE-acetaminophen (PERCOCET/ROXICET) 5-325 MG per tablet Take 1 tablet by mouth every 4 (four) hours as needed.       . simvastatin (ZOCOR) 40 MG tablet Take 40 mg by mouth at bedtime.       . Tetrahydrozoline HCl (VISINE OP) Apply 1 drop to eye daily as needed. Dry, itchy eyes.      . citalopram (CELEXA) 20 MG tablet Take 20 mg by mouth daily.          Allergies Review of patient's allergies indicates no known allergies.  Family History: No family history on file.  Social History: History   Social History  . Marital Status: Married    Spouse Name: N/A    Number of Children: N/A  . Years of Education: N/A   Occupational History  . Not on file.   Social History Main Topics  . Smoking status: Current Every Day Smoker -- 2.0 packs/day for 40 years    Types: Cigarettes  . Smokeless tobacco: Never Used     Comment: started smoking at age 45.  currently smoking 1.5 ppd.  . Alcohol Use: No  . Drug Use: No  .  Sexually Active: Yes   Other Topics Concern  . Not on file   Social History Narrative  . No narrative on file    Electrocardiogram:  07/09/12 SR rate 86 nonspecific lateral T wave changes  Assessment and Plan

## 2012-07-27 ENCOUNTER — Other Ambulatory Visit: Payer: Self-pay | Admitting: Neurosurgery

## 2012-07-27 DIAGNOSIS — M546 Pain in thoracic spine: Secondary | ICD-10-CM

## 2012-07-27 DIAGNOSIS — M5412 Radiculopathy, cervical region: Secondary | ICD-10-CM

## 2012-07-27 DIAGNOSIS — M47816 Spondylosis without myelopathy or radiculopathy, lumbar region: Secondary | ICD-10-CM

## 2012-08-03 ENCOUNTER — Other Ambulatory Visit: Payer: BC Managed Care – PPO

## 2012-08-08 ENCOUNTER — Ambulatory Visit
Admission: RE | Admit: 2012-08-08 | Discharge: 2012-08-08 | Disposition: A | Discharge status: Home / Self Care | Payer: BC Managed Care – PPO | Source: Ambulatory Visit | Attending: Neurosurgery | Admitting: Neurosurgery

## 2012-08-08 DIAGNOSIS — M47816 Spondylosis without myelopathy or radiculopathy, lumbar region: Secondary | ICD-10-CM

## 2012-08-08 DIAGNOSIS — M5412 Radiculopathy, cervical region: Secondary | ICD-10-CM

## 2012-08-08 DIAGNOSIS — M546 Pain in thoracic spine: Secondary | ICD-10-CM

## 2012-08-13 ENCOUNTER — Telehealth: Payer: Self-pay | Admitting: Pulmonary Disease

## 2012-08-13 MED ORDER — FLUTICASONE-SALMETEROL 500-50 MCG/DOSE IN AEPB: 1.0000 | INHALATION_SPRAY | Freq: Two times a day (BID) | RESPIRATORY_TRACT | Status: DC

## 2012-08-13 NOTE — Telephone Encounter (Signed)
Rx has been sent in, pt is aware. 

## 2012-08-23 ENCOUNTER — Telehealth: Payer: Self-pay | Admitting: Pulmonary Disease

## 2012-08-23 NOTE — Telephone Encounter (Signed)
Let pt know what is going on. He has tried [place] and didn't want to stay on it. Can try symbicort 160/4.5  2 inhalations am and pm everyday.  Rinse mouth well.

## 2012-08-23 NOTE — Telephone Encounter (Signed)
ATC patient, no answer LMOMTCB 

## 2012-08-23 NOTE — Telephone Encounter (Signed)
Spoke with [employer] rep, states patients insurance company will not cover Advair. Prefered alternatives are symbicort and Dulera (either strength). Dr. Shelle Iron, please advise, thanks!

## 2012-08-27 NOTE — Telephone Encounter (Signed)
I spoke with pt and he stated he has dulera and will stick with that for now. He will give this a try and call us if he has any problems. Nothing further was needed and will forward to [facility] as an [place]

## 2012-08-27 NOTE — Telephone Encounter (Signed)
lmtcb x1 for pt. 

## 2012-09-03 ENCOUNTER — Telehealth: Payer: Self-pay | Admitting: *Deleted

## 2012-09-03 NOTE — Telephone Encounter (Signed)
Patients insurance is not covering Advair 500/50 Requesting change to alternative med : Dulera or Symbicort through [employer]  Please specify which alternative and sig please. See Rx form in your [facility] folder. Thanks.

## 2012-09-03 NOTE — Telephone Encounter (Signed)
done

## 2012-10-09 ENCOUNTER — Ambulatory Visit: Payer: BC Managed Care – PPO | Admitting: Pulmonary Disease

## 2012-12-10 ENCOUNTER — Ambulatory Visit (INDEPENDENT_AMBULATORY_CARE_PROVIDER_SITE_OTHER): Payer: BC Managed Care – PPO | Admitting: Pulmonary Disease

## 2012-12-10 ENCOUNTER — Encounter: Payer: Self-pay | Admitting: Pulmonary Disease

## 2012-12-10 VITALS — BP 142/88 | HR 84 | Temp 98.3°F | Ht 71.0 in | Wt 376.6 lb

## 2012-12-10 DIAGNOSIS — R059 Cough, unspecified: Secondary | ICD-10-CM

## 2012-12-10 DIAGNOSIS — G4733 Obstructive sleep apnea (adult) (pediatric): Secondary | ICD-10-CM

## 2012-12-10 DIAGNOSIS — J449 Chronic obstructive pulmonary disease, unspecified: Secondary | ICD-10-CM

## 2012-12-10 DIAGNOSIS — R05 Cough: Secondary | ICD-10-CM

## 2012-12-10 NOTE — Patient Instructions (Addendum)
Will try you on symbicort 160/4.5  2 inhalations am and pm everyday in the place of dulera.  Let me know if this works for you, and we can send prescription in.  Stop smoking.  By doing this 1-2 weeks prior to your surgery, it decreases your risk of pulmonary problems during and after the surgery. Work on weight loss Take your cpap mask to your surgery, in the event you need cpap in the recovery period. Will send a note to your orthopedic surgeon, giving the ok for surgery from a pulmonary standpoint.  followup with me in 6mos.

## 2012-12-10 NOTE — Assessment & Plan Note (Signed)
The patient continues to have significant cough, dyspnea on exertion, primarily related to his ongoing smoking.  He needs to continue on a LABA/ICS for his ongoing airway inflammation.  He understands that his breathing and cough will never improve until he stopped smoking.  From a surgical standpoint, he is at fairly low risk for pulmonary complications associated with general anesthesia.  He can certainly improve his risk of postoperative pulmonary infection by stopping smoking one to 2 weeks prior to the procedure.  I also cautioned him about having the procedure done if he is having an active chest cold/pulmonary infection.

## 2012-12-10 NOTE — Assessment & Plan Note (Signed)
The patient is doing fairly well from a sleep apnea standpoint.  I have asked him to take his CPAP mask with him to his procedure, in the event that he needs CPAP in the postoperative period.  I've also encouraged him to work aggressively on weight loss.

## 2012-12-10 NOTE — Progress Notes (Signed)
  Subjective:    Patient ID: Jared [name], male    DOB: 05-17-1961, 52 y.o.   MRN: [id number]  HPI Patient comes in today for followup of his known chronic asthmatic bronchitis related to ongoing smoking, as well as obstructive sleep apnea.  He is scheduled for upcoming knee surgery, and needs clearance.  He continues to have cough with syncope, but also continues to smoke excessively.  His insurance would no longer cover Advair, so he was changed to dulera as a trial.  He feels that he has not done as well on this.  He currently has occasional sputum production that is tan to slightly yellow, but does not feel that he has a chest cold.  He feels that his breathing is at his usual baseline.  He is wearing CPAP compliantly, and feels that it is working well for him.  He is having no significant mask issues.   Review of Systems  Constitutional: Negative for fever and unexpected weight change.  HENT: Negative for ear pain, nosebleeds, congestion, sore throat, rhinorrhea, sneezing, trouble swallowing, dental problem, postnasal drip and sinus pressure.   Eyes: Negative for redness and itching.  Respiratory: Positive for cough and shortness of breath. Negative for chest tightness and wheezing.   Cardiovascular: Negative for palpitations and leg swelling.  Gastrointestinal: Negative for nausea and vomiting.  Genitourinary: Negative for dysuria.  Musculoskeletal: Negative for joint swelling.  Skin: Negative for rash.  Neurological: Positive for syncope, light-headedness and headaches.  Hematological: Does not bruise/bleed easily.  Psychiatric/Behavioral: Negative for dysphoric mood. The patient is not nervous/anxious.        Objective:   Physical Exam Morbidly obese male in no acute distress Nose without purulent or discharge noted No skin breakdown or pressure necrosis from the CPAP mask Neck without lymphadenopathy or thyromegaly Chest with decreased breath sounds, no active  wheezing Cardiac exam with regular rate and rhythm Lower extremities with mild edema, no cyanosis Alert and oriented, moves all 4 extremities.       Assessment & Plan:

## 2012-12-18 ENCOUNTER — Other Ambulatory Visit: Payer: Self-pay | Admitting: Orthopedic Surgery

## 2012-12-24 ENCOUNTER — Other Ambulatory Visit: Payer: Self-pay | Admitting: Orthopedic Surgery

## 2012-12-24 NOTE — H&P (Signed)
Jared [name] is an 52 y.o. male.   Chief Complaint: right knee pain HPI: The patient is a 52 year old male being followed for their left knee medial femoral condyle insufficiency fx. They are now 7 months out from when symptoms began. Symptoms reported today include: pain, swelling, throbbing, stiffness, giving way, instability, pain with weightbearing and difficulty ambulating. The patient feels that they are doing poorly. Current treatment includes: modified weightbearing, relative rest, activity modification and pain medications. The following medication has been used for pain control: Norco and Neurontin. Pt presents today in motorized scooter which he has had for a week now, which he purchased on [employer]. Since getting the scooter he has been much more compliant with NWB restrictions. Prior to that, he admits he was unable to remain NWB. He reports continued pain, even at rest, as well as swelling. He notes pain with even short distances of weightbearing. He is also being tx for his back, was started on Neurontin 300mg  TID.   Past Medical History  Diagnosis Date  . OSA (obstructive sleep apnea)   . Tobacco abuse   . Emphysema   . RBBB (right bundle branch block with left anterior fascicular block)   . Syncope   . Anxiety   . Cough syncope   . Back pain   . GERD (gastroesophageal reflux disease)   . COPD (chronic obstructive pulmonary disease)   . High cholesterol   . Renal disorder   . Asthma     Past Surgical History  Procedure Laterality Date  . Hernia repair    . Surgery for sleep apnea      No family history on file. Social History:  reports that he has been smoking Cigarettes.  He has a 80 pack-year smoking history. He has never used smokeless tobacco. He reports that he does not drink alcohol or use illicit drugs.  Allergies: No Known Allergies   (Not in a hospital admission)  No results found for this or any previous visit (from the past 48 hour(s)). No  results found.  Review of Systems  Constitutional: Negative.   HENT: Negative.   Eyes: Negative.   Respiratory: Negative.   Cardiovascular: Negative.   Gastrointestinal: Negative.   Genitourinary: Negative.   Musculoskeletal: Positive for joint pain.  Skin: Negative.   Neurological: Negative.   Endo/Heme/Allergies: Negative.   Psychiatric/Behavioral: Negative.     There were no vitals taken for this visit. Physical Exam  Constitutional: He is oriented to person, place, and time. He appears well-developed and well-nourished.  HENT:  Head: Normocephalic and atraumatic.  Eyes: Conjunctivae and EOM are normal. Pupils are equal, round, and reactive to light.  Neck: Normal range of motion. Neck supple.  Cardiovascular: Normal rate and regular rhythm.   Respiratory: Effort normal and breath sounds normal.  GI: Soft. Bowel sounds are normal.  Musculoskeletal:  Left Knee: Inspection and Palpation:Tenderness- medial knee tender to palpation (medial femoral condyle) and medial joint line tender to palpation. no tenderness to palpation of the superior calf (no sign of DVT), no tenderness to palpation of the quadriceps tendon, no tenderness to palpation of the patellar tendon, no tenderness to palpation of the patella, no tenderness to palpation of the lateral joint line, no tenderness to palpation of the fibular head and no tenderness to palpation of the peroneal nerve. Swelling- periarticular swelling present. Effusion- trace. Tissue tension/texture is - soft. Pulses- 2+. Sensation- intact to light touch. Skin:Color- no ecchymosis and no erythema. Strength and  Tone:Quadriceps- 5/5. Hamstrings- 5/5. ROM: Flexion:AROM- 90 . Extension:AROM- 0 . Instability- Valgus Laxity at 30- None. Valgus Laxity at 0- None. Varus Laxity at 30- None. Varus Laxity at 0- None. Lachman- Negative. Anterior Drawer Test- Negative. Posterior Drawer Test- Negative.  Deformities/Malalignments/Discrepancies- no deformities noted. Special Tests:McMurray Test (lateral) - negative. McMurray Test (medial)- negative. Patellar Compression Pain- no pain.  Neurological: He is alert and oriented to person, place, and time. He has normal reflexes.  Skin: Skin is warm and dry.  Psychiatric: He has a normal mood and affect.    xrays ordered, obtained, reviewed today by Dr. Shelle Iron with now visible cortical defect/cavitation at the medial femoral condyle. There is no new bone formation noted at this site. No other fx seen. No subluxation, dislocation, lytic or blastic lesions. Mild medial joint space narrowing.  MRI from 4/14 images and report again reviewed by Dr. Shelle Iron with extensive subchondral insufficiency fx medial femoral condyle with mild articular surface collapse, bone bruising/stress rxn medial tibial plateau, LMT, MMT, chondromalacia (medial compartment and trochlea).  Assessment/Plan R knee DJD, medial femoral condyle insufficiency fx  Pt with R knee pain, chondromalacia, medial femoral condyle insufficiency fx which has failed to heal with conservative tx, with continued pain and swelling and limitation of activity. Discussed tx options at this point, which could include continued rest vs. potential surgery. Pt feels his symptoms have failed to improve and is interested in further intervention. Discussed surgical options. His joint spaces are intact therefore he is not yet at this point a candidate for a total knee replacement. Especially given his comorbidities would advise against total knee replacement at this time, even partial knee replacement. Discussed arthroscopy with debridement and microfx technique to stimulate bony growth at the insufficiency fx site. Discussed procedure itself as well as risks, complications, and alternatives including but not limited to DVT, PE, infx, bleeding, failure of procedure, need for secondary procedure, anesthesia risk,  even death. Will obtain pre-op clearance from both Dr. Denzil Magnuson (PCP) and Dr. Shelle Iron (Pulm). Also discussed weight loss as well as smoking cessation to allow this to heal post-op and reduce chance of infx. He will likely remain NWB for at least 6 weeks post-op to allow bony ingrowth of the affected area. He is currently taking ASA daily as DVT ppx as he is NWB. No hx of DVT. Will schedule accordingly after clearance is received. Rx for Percocet given in the interim. Recommend ice and elevation for swelling.  Plan L knee arthroscopy, debridement, microfracture/drilling medial femoral condyle  BISSELL, JACLYN M. for Dr. Shelle Iron 12/24/2012, 2:55 PM

## 2012-12-27 ENCOUNTER — Encounter (HOSPITAL_COMMUNITY): Payer: Self-pay | Admitting: Pharmacy Technician

## 2012-12-31 NOTE — Patient Instructions (Signed)
20 Jared [name]  12/31/2012   Your procedure is scheduled on:  01/03/13  Report to [facility] at 5:15 AM.  Call this number if you have problems the morning of surgery 336-: [phone]   Remember: please bring inhaler on day of surgery   Do not eat food or drink liquids After Midnight.     Take these medicines the morning of surgery with A SIP OF WATER: celexa, nexium, gabapentin, simvastatin, inhalers if needed   Do not wear jewelry, make-up or nail polish.  Do not wear lotions, powders, or perfumes. You may wear deodorant.  Do not shave 48 hours prior to surgery. Men may shave face and neck.  Do not bring valuables to the hospital.  Contacts, dentures or bridgework may not be worn into surgery.  Leave suitcase in the car. After surgery it may be brought to your room.  For patients admitted to the hospital, checkout time is 11:00 AM the day of discharge.    Please read over the following fact sheets that you were given: incentive spirometry fact sheet Birdie Sons, RN  pre op nurse call if needed [phone]    FAILURE TO FOLLOW THESE INSTRUCTIONS MAY RESULT IN CANCELLATION OF YOUR SURGERY   Patient Signature: ___________________________________________

## 2012-12-31 NOTE — Progress Notes (Signed)
Chest x-ray 07/08/12 on EPIC, EKG 07/09/12 on EPIC

## 2013-01-01 ENCOUNTER — Inpatient Hospital Stay (HOSPITAL_COMMUNITY)
Admission: RE | Admit: 2013-01-01 | Discharge: 2013-01-01 | Disposition: A | Discharge status: Home / Self Care | Payer: BC Managed Care – PPO | Source: Ambulatory Visit

## 2013-01-01 NOTE — Patient Instructions (Addendum)
20 Santhiago Collingsworth [facility]  01/02/2013   Your procedure is scheduled on: 7-17  -2014  Report to [facility] at      0530  AM .  Call this number if you have problems the morning of surgery: [phone]  Or Presurgical Testing [phone](Zyiah Withington)    For Cpap use: Bring mask and tubing only.   Do not eat food:After Midnight.    Take these medicines the morning of surgery with A SIP OF WATER: Citalopram. Nexium. Neurontin. Hydrocodone. Simvastatin. Eye drops/bring. Use/ bring inhalers- ProAir/Dulera.   Do not wear jewelry, make-up or nail polish.  Do not wear lotions, powders, or perfumes. You may wear deodorant.  Do not shave 12 hours prior to first CHG shower(legs and under arms).(face and neck okay.)  Do not bring valuables to the hospital.  Contacts, dentures or bridgework,body piercing,  may not be worn into surgery.  Leave suitcase in the car. After surgery it may be brought to your room.  For patients admitted to the hospital, checkout time is 11:00 AM the day of discharge.   Patients discharged the day of surgery will not be allowed to drive home. Must have responsible person with you x 24 hours once discharged.  Name and phone number of your driver: Patsy "Lachlan Mckim- spouse [phone] cell  Special Instructions: CHG(Chlorhedine 4%-"Hibiclens","Betasept","Aplicare") Education officer, community Wash: see special instructions.(avoid face and genitals)   Please read over the following fact sheets that you were given:  Incentive Spirometry Instruction.    Failure to follow these instructions may result in Cancellation of your surgery.   Patient signature_______________________________________________________

## 2013-01-02 ENCOUNTER — Encounter (HOSPITAL_COMMUNITY): Payer: Self-pay

## 2013-01-02 ENCOUNTER — Encounter (HOSPITAL_COMMUNITY)
Admission: RE | Admit: 2013-01-02 | Discharge: 2013-01-02 | Disposition: A | Discharge status: Home / Self Care | Payer: BC Managed Care – PPO | Source: Ambulatory Visit | Attending: Specialist | Admitting: Specialist

## 2013-01-02 HISTORY — DX: Unspecified osteoarthritis, unspecified site: M19.90

## 2013-01-02 HISTORY — DX: Personal history of urinary calculi: Z87.442

## 2013-01-02 LAB — BASIC METABOLIC PANEL
CO2: 30 mEq/L (ref 19–32)
Calcium: 9.4 mg/dL (ref 8.4–10.5)
Chloride: 102 mEq/L (ref 96–112)
Sodium: 141 mEq/L (ref 135–145)

## 2013-01-02 LAB — CBC
MCH: 32.1 pg (ref 26.0–34.0)
Platelets: 256 10*3/uL (ref 150–400)
RBC: 4.74 MIL/uL (ref 4.22–5.81)
WBC: 7.6 10*3/uL (ref 4.0–10.5)

## 2013-01-02 NOTE — Pre-Procedure Instructions (Signed)
01-02-13 EKG/ CXR 1'14-Epic.

## 2013-01-03 ENCOUNTER — Encounter (HOSPITAL_COMMUNITY)
Admission: RE | Disposition: A | Discharge status: Home / Self Care | Payer: Self-pay | Source: Ambulatory Visit | Attending: Specialist

## 2013-01-03 ENCOUNTER — Encounter (HOSPITAL_COMMUNITY): Payer: Self-pay | Admitting: Anesthesiology

## 2013-01-03 ENCOUNTER — Ambulatory Visit (HOSPITAL_COMMUNITY)
Admission: RE | Admit: 2013-01-03 | Discharge: 2013-01-03 | Disposition: A | Discharge status: Home / Self Care | Payer: BC Managed Care – PPO | Source: Ambulatory Visit | Attending: Specialist | Admitting: Specialist

## 2013-01-03 ENCOUNTER — Encounter (HOSPITAL_COMMUNITY): Payer: Self-pay | Admitting: *Deleted

## 2013-01-03 ENCOUNTER — Ambulatory Visit (HOSPITAL_COMMUNITY): Payer: BC Managed Care – PPO | Admitting: Anesthesiology

## 2013-01-03 DIAGNOSIS — G4733 Obstructive sleep apnea (adult) (pediatric): Secondary | ICD-10-CM | POA: Insufficient documentation

## 2013-01-03 DIAGNOSIS — S83289A Other tear of lateral meniscus, current injury, unspecified knee, initial encounter: Secondary | ICD-10-CM

## 2013-01-03 DIAGNOSIS — K219 Gastro-esophageal reflux disease without esophagitis: Secondary | ICD-10-CM | POA: Insufficient documentation

## 2013-01-03 DIAGNOSIS — J438 Other emphysema: Secondary | ICD-10-CM | POA: Insufficient documentation

## 2013-01-03 DIAGNOSIS — M23305 Other meniscus derangements, unspecified medial meniscus, unspecified knee: Secondary | ICD-10-CM | POA: Insufficient documentation

## 2013-01-03 DIAGNOSIS — M23302 Other meniscus derangements, unspecified lateral meniscus, unspecified knee: Secondary | ICD-10-CM | POA: Insufficient documentation

## 2013-01-03 DIAGNOSIS — Z01812 Encounter for preprocedural laboratory examination: Secondary | ICD-10-CM | POA: Insufficient documentation

## 2013-01-03 DIAGNOSIS — M9420 Chondromalacia, unspecified site: Secondary | ICD-10-CM | POA: Insufficient documentation

## 2013-01-03 DIAGNOSIS — F17200 Nicotine dependence, unspecified, uncomplicated: Secondary | ICD-10-CM | POA: Insufficient documentation

## 2013-01-03 DIAGNOSIS — M1712 Unilateral primary osteoarthritis, left knee: Secondary | ICD-10-CM

## 2013-01-03 HISTORY — PX: KNEE ARTHROSCOPY: SHX127

## 2013-01-03 SURGERY — ARTHROSCOPY, KNEE
Anesthesia: Spinal | Site: Knee | Laterality: Left | Wound class: Clean

## 2013-01-03 MED ORDER — BUPIVACAINE IN DEXTROSE 0.75-8.25 % IT SOLN
INTRATHECAL | Status: DC | PRN
  Administered 2013-01-03: 2 mL via INTRATHECAL

## 2013-01-03 MED ORDER — ONDANSETRON HCL 4 MG/2ML IJ SOLN
INTRAMUSCULAR | Status: DC | PRN
  Administered 2013-01-03 (×2): 2 mg via INTRAVENOUS

## 2013-01-03 MED ORDER — EPHEDRINE SULFATE 50 MG/ML IJ SOLN
INTRAMUSCULAR | Status: DC | PRN
  Administered 2013-01-03: 15 mg via INTRAVENOUS
  Administered 2013-01-03: 10 mg via INTRAVENOUS

## 2013-01-03 MED ORDER — LACTATED RINGERS IV SOLN
INTRAVENOUS | Status: DC
  Administered 2013-01-03 (×2): via INTRAVENOUS

## 2013-01-03 MED ORDER — BUPIVACAINE-EPINEPHRINE 0.5% -1:200000 IJ SOLN
INTRAMUSCULAR | Status: DC | PRN
  Administered 2013-01-03: 20 mL

## 2013-01-03 MED ORDER — EPINEPHRINE HCL 1 MG/ML IJ SOLN
INTRAMUSCULAR | Status: AC
  Filled 2013-01-03: qty 2

## 2013-01-03 MED ORDER — CEFAZOLIN SODIUM 1-5 GM-% IV SOLN
INTRAVENOUS | Status: AC
  Filled 2013-01-03: qty 50

## 2013-01-03 MED ORDER — CEFAZOLIN SODIUM-DEXTROSE 2-3 GM-% IV SOLR
INTRAVENOUS | Status: AC
  Filled 2013-01-03: qty 50

## 2013-01-03 MED ORDER — EPINEPHRINE HCL 1 MG/ML IJ SOLN
INTRAMUSCULAR | Status: DC | PRN
  Administered 2013-01-03: 2 mg

## 2013-01-03 MED ORDER — ALBUTEROL SULFATE (5 MG/ML) 0.5% IN NEBU
2.5000 mg | INHALATION_SOLUTION | Freq: Once | RESPIRATORY_TRACT | Status: AC
  Administered 2013-01-03: 2.5 mg via RESPIRATORY_TRACT

## 2013-01-03 MED ORDER — PROMETHAZINE HCL 25 MG/ML IJ SOLN: 6.2500 mg | INTRAMUSCULAR | Status: DC | PRN

## 2013-01-03 MED ORDER — OXYCODONE-ACETAMINOPHEN 7.5-325 MG PO TABS: 1.0000 | ORAL_TABLET | ORAL | Status: DC | PRN

## 2013-01-03 MED ORDER — ALBUTEROL SULFATE (5 MG/ML) 0.5% IN NEBU
INHALATION_SOLUTION | RESPIRATORY_TRACT | Status: AC
  Filled 2013-01-03: qty 0.5

## 2013-01-03 MED ORDER — LACTATED RINGERS IR SOLN
Status: DC | PRN
  Administered 2013-01-03: 6000 mL

## 2013-01-03 MED ORDER — METHOCARBAMOL 500 MG PO TABS: 500.0000 mg | ORAL_TABLET | Freq: Three times a day (TID) | ORAL | Status: DC

## 2013-01-03 MED ORDER — ACETAMINOPHEN 10 MG/ML IV SOLN
INTRAVENOUS | Status: DC | PRN
  Administered 2013-01-03: 1000 mg via INTRAVENOUS

## 2013-01-03 MED ORDER — CHLORHEXIDINE GLUCONATE 4 % EX LIQD
60.0000 mL | Freq: Once | CUTANEOUS | Status: DC
  Filled 2013-01-03: qty 60

## 2013-01-03 MED ORDER — ACETAMINOPHEN 10 MG/ML IV SOLN
1000.0000 mg | Freq: Once | INTRAVENOUS | Status: DC
  Filled 2013-01-03: qty 100

## 2013-01-03 MED ORDER — DEXTROSE 5 % IV SOLN
3.0000 g | INTRAVENOUS | Status: AC
  Administered 2013-01-03: 3 g via INTRAVENOUS
  Filled 2013-01-03: qty 3000

## 2013-01-03 MED ORDER — ASPIRIN 81 MG PO TABS: 325.0000 mg | ORAL_TABLET | Freq: Every day | ORAL | Status: DC

## 2013-01-03 MED ORDER — LACTATED RINGERS IV SOLN: INTRAVENOUS | Status: DC

## 2013-01-03 MED ORDER — BUPIVACAINE-EPINEPHRINE (PF) 0.5% -1:200000 IJ SOLN
INTRAMUSCULAR | Status: AC
  Filled 2013-01-03: qty 10

## 2013-01-03 MED ORDER — ASPIRIN EC 325 MG PO TBEC: 325.0000 mg | DELAYED_RELEASE_TABLET | Freq: Two times a day (BID) | ORAL | Status: DC

## 2013-01-03 MED ORDER — PROPOFOL INFUSION 10 MG/ML OPTIME
INTRAVENOUS | Status: DC | PRN
  Administered 2013-01-03: 70 ug/kg/min via INTRAVENOUS

## 2013-01-03 MED ORDER — HYDROMORPHONE HCL PF 1 MG/ML IJ SOLN: 0.2500 mg | INTRAMUSCULAR | Status: DC | PRN

## 2013-01-03 SURGICAL SUPPLY — 37 items
BANDAGE ELASTIC 6 VELCRO ST LF (GAUZE/BANDAGES/DRESSINGS) ×1 IMPLANT
BLADE 4.2CUDA (BLADE) IMPLANT
BLADE CUDA SHAVER 3.5 (BLADE) ×2 IMPLANT
CLOTH 2% CHLOROHEXIDINE 3PK (PERSONAL CARE ITEMS) ×2 IMPLANT
CLOTH BEACON ORANGE TIMEOUT ST (SAFETY) ×2 IMPLANT
DRSG ADAPTIC 3X8 NADH LF (GAUZE/BANDAGES/DRESSINGS) ×1 IMPLANT
DRSG EMULSION OIL 3X3 NADH (GAUZE/BANDAGES/DRESSINGS) ×1 IMPLANT
DURAFORM SPONGE 2X2 SINGLE (Neuro Prosthesis/Implant) ×2 IMPLANT
DURAPREP 26ML APPLICATOR (WOUND CARE) ×2 IMPLANT
DURASEAL SPINE SEALANT 3ML (MISCELLANEOUS) ×1 IMPLANT
FILTER STRAW (MISCELLANEOUS) ×1 IMPLANT
GLOVE BIOGEL PI IND STRL 7.5 (GLOVE) ×1 IMPLANT
GLOVE BIOGEL PI IND STRL 8 (GLOVE) ×1 IMPLANT
GLOVE BIOGEL PI INDICATOR 7.5 (GLOVE) ×1
GLOVE BIOGEL PI INDICATOR 8 (GLOVE) ×1
GLOVE SURG SS PI 7.5 STRL IVOR (GLOVE) ×2 IMPLANT
GLOVE SURG SS PI 8.0 STRL IVOR (GLOVE) ×4 IMPLANT
GOWN PREVENTION PLUS LG XLONG (DISPOSABLE) ×2 IMPLANT
GOWN STRL REIN XL XLG (GOWN DISPOSABLE) ×4 IMPLANT
IMMOBILIZER KNEE 20 (SOFTGOODS) ×2
IMMOBILIZER KNEE 20 THIGH 36 (SOFTGOODS) IMPLANT
IV CATH 14GX2 1/4 (CATHETERS) ×2 IMPLANT
MANIFOLD NEPTUNE II (INSTRUMENTS) ×3 IMPLANT
NDL SAFETY ECLIPSE 18X1.5 (NEEDLE) IMPLANT
NEEDLE HYPO 18GX1.5 SHARP (NEEDLE) ×2
PACK ARTHROSCOPY WL (CUSTOM PROCEDURE TRAY) ×2 IMPLANT
PADDING CAST ABS 6INX4YD NS (CAST SUPPLIES) ×1
PADDING CAST ABS COTTON 6X4 NS (CAST SUPPLIES) IMPLANT
PADDING CAST COTTON 6X4 STRL (CAST SUPPLIES) ×2 IMPLANT
SET ARTHROSCOPY TUBING (MISCELLANEOUS) ×2
SET ARTHROSCOPY TUBING LN (MISCELLANEOUS) ×1 IMPLANT
SPONGE GAUZE 4X4 12PLY (GAUZE/BANDAGES/DRESSINGS) ×1 IMPLANT
SUT ETHILON 4 0 PS 2 18 (SUTURE) ×2 IMPLANT
SUT NURALON 4 0 TR CR/8 (SUTURE) ×1 IMPLANT
SYR 20CC LL (SYRINGE) ×1 IMPLANT
SYR 3ML LL SCALE MARK (SYRINGE) ×1 IMPLANT
WRAP KNEE MAXI GEL POST OP (GAUZE/BANDAGES/DRESSINGS) ×2 IMPLANT

## 2013-01-03 NOTE — Anesthesia Procedure Notes (Signed)
Spinal  Patient location during procedure: OR Start time: 01/03/2013 7:40 AM End time: 01/03/2013 7:45 AM Staffing Anesthesiologist: Lucille Passy F Performed by: anesthesiologist  Preanesthetic Checklist Completed: patient identified, site marked, surgical consent, pre-op evaluation, timeout performed, IV checked, risks and benefits discussed and monitors and equipment checked Spinal Block Patient position: sitting Prep: Betadine Patient monitoring: heart rate, continuous pulse ox and blood pressure Approach: midline Location: L3-4 Injection technique: single-shot Needle Needle type: Spinocan  Needle gauge: 22 G Needle length: 12.7 cm Additional Notes Expiration date of kit checked and confirmed. Patient tolerated procedure well, without complications. Negative heme/paresthesia

## 2013-01-03 NOTE — Transfer of Care (Signed)
Immediate Anesthesia Transfer of Care Note  Patient: Jared [name]  Procedure(s) Performed: Procedure(s): LEFT KNEE ARTHROSCOPY WITH DEBRIDEMENT/MICROFRACTURE/MEDIAL FEMORAL CHONDIAL/PARTIAL MEDIAL AND LATERAL MENISCECTOMY (Left)  Patient Location: PACU  Anesthesia Type:MAC and Spinal  Level of Consciousness: awake and alert   Airway & Oxygen Therapy: Patient Spontanous Breathing and Patient connected to nasal cannula oxygen  Post-op Assessment: Report given to PACU RN and Post -op Vital signs reviewed and stable  Post vital signs: Reviewed and stable  Complications: No apparent anesthesia complications

## 2013-01-03 NOTE — Progress Notes (Signed)
PT Jared [name] paged at 1310 walker at bedside.

## 2013-01-03 NOTE — Progress Notes (Signed)
PA called to clarify orders.

## 2013-01-03 NOTE — Anesthesia Postprocedure Evaluation (Signed)
Anesthesia Post Note  Patient: Jared [name]  Procedure(s) Performed: Procedure(s) (LRB): LEFT KNEE ARTHROSCOPY WITH DEBRIDEMENT/MICROFRACTURE/MEDIAL FEMORAL CHONDIAL/PARTIAL MEDIAL AND LATERAL MENISCECTOMY (Left)  Anesthesia type: Spinal  Patient location: PACU  Post pain: Pain level controlled  Post assessment: Post-op Vital signs reviewed  Last Vitals:  Filed Vitals:   01/03/13 1113  BP: 103/67  Pulse: 68  Temp: 36.6 C  Resp: 14    Post vital signs: Reviewed  Level of consciousness: sedated  Complications: No apparent anesthesia complications

## 2013-01-03 NOTE — Interval H&P Note (Signed)
History and Physical Interval Note:  01/03/2013 7:31 AM  Jared [name]  has presented today for surgery, with the diagnosis of left knee djd/medial femoral chondial/insufficiency fracture  The various methods of treatment have been discussed with the patient and family. After consideration of risks, benefits and other options for treatment, the patient has consented to  Procedure(s): LEFT KNEE ARTHROSCOPY WITH DEBRIDEMENT/DRILLING/MICROFRACTURE/MEDIAL FEMORAL CHONDIAL (Left) as a surgical intervention .  The patient's history has been reviewed, patient examined, no change in status, stable for surgery.  I have reviewed the patient's chart and labs.  Questions were answered to the patient's satisfaction.     [name]

## 2013-01-03 NOTE — Evaluation (Signed)
Physical Therapy Evaluation Patient Details Name: Jared [name] MRN: [id number] DOB: [birth year] Today's Date: 01/03/2013 Time: 8295-6213 PT Time Calculation (min): 14 min  PT Assessment / Plan / Recommendation History of Present Illness  52 year old male being followed for left knee medial femoral condyle insufficiency fx. They are now 7 months out from when symptoms began. Symptoms reported today include: pain, swelling, throbbing, stiffness, giving way, instability, pain with weightbearing and difficulty ambulating. S/p L knee scope with debridement,partial medial/lateral meniscectomy, medial femoral chondial insufficeincy fx  Clinical Impression  On eval, pt required Min assist for stabilization while ambulating ~30 feet with RW, NWB. Discussed best/safest options for mobilizing in home. Pt has scooter that he can use throughout home except in bathroom. Pt also stated he has a step up to get to commode and shower. Pt convinced he could perform squat/stand pivot up onto step to get onto toilet and to shower. Recommended pt have wife purchase BSC and for pt to sponge bathe until MD updates/increases weightbearing. Pt verbalized understanding. All education completed. No further PT needs at this time. 1x eval.     PT Assessment  Patent does not need any further PT services    Follow Up Recommendations  No PT follow up    Does the patient have the potential to tolerate intense rehabilitation      Barriers to Discharge        Equipment Recommendations  None recommended by PT    Recommendations for Other Services     Frequency      Precautions / Restrictions Precautions Precautions: Fall Required Braces or Orthoses: Knee Immobilizer - Left Restrictions Weight Bearing Restrictions: Yes LLE Weight Bearing: Non weight bearing   Pertinent Vitals/Pain L LE "okay"      Mobility  Bed Mobility Bed Mobility: Not assessed Details for Bed Mobility Assistance: pt sitting  eob Transfers Transfers: Sit to Stand;Stand to Sit Sit to Stand: 4: Min assist;From bed Stand to Sit: 4: Min assist;To chair/3-in-1 Details for Transfer Assistance: assist to rise, stabilize, control descent. VS safey, technique, hand placement Ambulation/Gait Ambulation/Gait Assistance: 4: Min assist Ambulation Distance (Feet): 30 Feet Assistive device: Rolling walker Ambulation/Gait Assistance Details: assist to stabilize. VCS safety, technique, sequence, adherence to NWB status.  Gait Pattern: Step-to pattern Stairs: No    Exercises     PT Diagnosis:    PT Problem List:   PT Treatment Interventions:       PT Goals(Current goals can be found in the care plan section) Acute Rehab PT Goals PT Goal Formulation: No goals set, d/c therapy  Visit Information  Last PT Received On: 01/03/13 Assistance Needed: +1 History of Present Illness: 52 year old male being followed for their left knee medial femoral condyle insufficiency fx. They are now 7 months out from when symptoms began. Symptoms reported today include: pain, swelling, throbbing, stiffness, giving way, instability, pain with weightbearing and difficulty ambulating. S/p L knee scope with debridement,partial medial/lateral meniscectomy, medial femoral chondial insufficeincy fx       Prior Functioning  Home Living Family/patient expects to be discharged to:: Private residence Living Arrangements: Spouse/significant other Available Help at Discharge: Family Type of Home: House Home Access: Ramped entrance Home Layout: One level Home Equipment: Insurance underwriter - 2 wheels Prior Function Level of Independence: Independent Communication Communication: No difficulties    Cognition  Cognition Arousal/Alertness: Awake/[name] Behavior During Therapy: WFL for tasks assessed/performed Overall Cognitive Status: Within Functional Limits for tasks assessed    Extremity/Trunk  Assessment Upper Extremity Assessment Upper  Extremity Assessment: Defer to OT evaluation Lower Extremity Assessment Lower Extremity Assessment: LLE deficits/detail LLE Deficits / Details: LLE in KI. did not remove. hip flex at least 3/5. Cervical / Trunk Assessment Cervical / Trunk Assessment: Normal   Balance    End of Session PT - End of Session Activity Tolerance: Patient tolerated treatment well Patient left: in bed;with call bell/phone within reach;with family/visitor present  GP Functional Assessment Tool Used: clinical judgement Functional Limitation: Mobility: Walking and moving around Mobility: Walking and Moving Around Current Status (V4098): At least 20 percent but less than 40 percent impaired, limited or restricted Mobility: Walking and Moving Around Goal Status [id number]): At least 20 percent but less than 40 percent impaired, limited or restricted Mobility: Walking and Moving Around Discharge Status [id number]): At least 20 percent but less than 40 percent impaired, limited or restricted   Jared [name], MPT Pager: [phone]

## 2013-01-03 NOTE — Brief Op Note (Signed)
01/03/2013  8:46 AM  PATIENT:  Jared [name]  52 y.o. male  PRE-OPERATIVE DIAGNOSIS:  left knee djd/medial femoral chondial/insufficiency fracture  POST-OPERATIVE DIAGNOSIS:  left knee degenerative joint disease, medial femoral chondial/insufficiency fracture  PROCEDURE:  Procedure(s): LEFT KNEE ARTHROSCOPY WITH DEBRIDEMENT/MICROFRACTURE/MEDIAL FEMORAL CHONDIAL/PARTIAL MEDIAL AND LATERAL MENISCECTOMY (Left)  SURGEON:  Surgeon(s) and Role:    * Javier Docker, MD - Primary  PHYSICIAN ASSISTANT:   ASSISTANTS: Dawayne Cirri ANESTHESIA:   epidural  EBL:  Total I/O In: 1000 [I.V.:1000] Out: -   BLOOD ADMINISTERED:none  DRAINS: none   LOCAL MEDICATIONS USED:  MARCAINE     SPECIMEN:  No Specimen  DISPOSITION OF SPECIMEN:  N/A  COUNTS:  YES  TOURNIQUET:  * No tourniquets in log *  DICTATION: .Other Dictation: Dictation Number [id number]  PLAN OF CARE: Discharge to home after PACU  PATIENT DISPOSITION:  PACU - hemodynamically stable.   Delay start of Pharmacological VTE agent (>24hrs) due to surgical blood loss or risk of bleeding: no

## 2013-01-03 NOTE — Anesthesia Preprocedure Evaluation (Addendum)
Anesthesia Evaluation  Patient identified by MRN, date of birth, ID band Patient awake    Reviewed: Allergy & Precautions, H&P , NPO status , Patient's Chart, lab work & pertinent test results  Airway Mallampati: III TM Distance: >3 FB Neck ROM: Full    Dental  (+) Teeth Intact, Partial Upper and Missing,    Pulmonary asthma , sleep apnea and Continuous Positive Airway Pressure Ventilation , COPDCurrent Smoker,    + decreased breath sounds      Cardiovascular hypertension, Pt. on medications Rhythm:Regular Rate:Normal     Neuro/Psych Anxiety Hx of syncope    GI/Hepatic negative GI ROS, Neg liver ROS, GERD-  Medicated,  Endo/Other  negative endocrine ROSMorbid obesity  Renal/GU negative Renal ROS  negative genitourinary   Musculoskeletal negative musculoskeletal ROS (+)   Abdominal   Peds  Hematology negative hematology ROS (+)   Anesthesia Other Findings Severe deconditioning due patients morbid obesity and tobacco abuse (2 pks/day). 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  Reproductive/Obstetrics                         Anesthesia Physical Anesthesia Plan  ASA: III  Anesthesia Plan: Spinal   Post-op Pain Management:    Induction:   Airway Management Planned: Simple Face Mask  Additional Equipment:   Intra-op Plan:   Post-operative Plan:   Informed Consent: I have reviewed the patients History and Physical, chart, labs and discussed the procedure including the risks, benefits and alternatives for the proposed anesthesia with the patient or authorized representative who has indicated his/her understanding and acceptance.   Dental advisory given  Plan Discussed with: CRNA  Anesthesia Plan Comments:         Anesthesia Quick Evaluation

## 2013-01-03 NOTE — H&P (View-Only) (Signed)
Jared [name] is an 51 y.o. male.   Chief Complaint: right knee pain HPI: The patient is a 51 year old male being followed for their left knee medial femoral condyle insufficiency fx. They are now 7 months out from when symptoms began. Symptoms reported today include: pain, swelling, throbbing, stiffness, giving way, instability, pain with weightbearing and difficulty ambulating. The patient feels that they are doing poorly. Current treatment includes: modified weightbearing, relative rest, activity modification and pain medications. The following medication has been used for pain control: Norco and Neurontin. Pt presents today in motorized scooter which he has had for a week now, which he purchased on [employer]. Since getting the scooter he has been much more compliant with NWB restrictions. Prior to that, he admits he was unable to remain NWB. He reports continued pain, even at rest, as well as swelling. He notes pain with even short distances of weightbearing. He is also being tx for his back, was started on Neurontin 300mg TID.   Past Medical History  Diagnosis Date  . OSA (obstructive sleep apnea)   . Tobacco abuse   . Emphysema   . RBBB (right bundle branch block with left anterior fascicular block)   . Syncope   . Anxiety   . Cough syncope   . Back pain   . GERD (gastroesophageal reflux disease)   . COPD (chronic obstructive pulmonary disease)   . High cholesterol   . Renal disorder   . Asthma     Past Surgical History  Procedure Laterality Date  . Hernia repair    . Surgery for sleep apnea      No family history on file. Social History:  reports that he has been smoking Cigarettes.  He has a 80 pack-year smoking history. He has never used smokeless tobacco. He reports that he does not drink alcohol or use illicit drugs.  Allergies: No Known Allergies   (Not in a hospital admission)  No results found for this or any previous visit (from the past 48 hour(s)). No  results found.  Review of Systems  Constitutional: Negative.   HENT: Negative.   Eyes: Negative.   Respiratory: Negative.   Cardiovascular: Negative.   Gastrointestinal: Negative.   Genitourinary: Negative.   Musculoskeletal: Positive for joint pain.  Skin: Negative.   Neurological: Negative.   Endo/Heme/Allergies: Negative.   Psychiatric/Behavioral: Negative.     There were no vitals taken for this visit. Physical Exam  Constitutional: He is oriented to person, place, and time. He appears well-developed and well-nourished.  HENT:  Head: Normocephalic and atraumatic.  Eyes: Conjunctivae and EOM are normal. Pupils are equal, round, and reactive to light.  Neck: Normal range of motion. Neck supple.  Cardiovascular: Normal rate and regular rhythm.   Respiratory: Effort normal and breath sounds normal.  GI: Soft. Bowel sounds are normal.  Musculoskeletal:  Left Knee: Inspection and Palpation:Tenderness- medial knee tender to palpation (medial femoral condyle) and medial joint line tender to palpation. no tenderness to palpation of the superior calf (no sign of DVT), no tenderness to palpation of the quadriceps tendon, no tenderness to palpation of the patellar tendon, no tenderness to palpation of the patella, no tenderness to palpation of the lateral joint line, no tenderness to palpation of the fibular head and no tenderness to palpation of the peroneal nerve. Swelling- periarticular swelling present. Effusion- trace. Tissue tension/texture is - soft. Pulses- 2+. Sensation- intact to light touch. Skin:Color- no ecchymosis and no erythema. Strength and   Tone:Quadriceps- 5/5. Hamstrings- 5/5. ROM: Flexion:AROM- 90 . Extension:AROM- 0 . Instability- Valgus Laxity at 30- None. Valgus Laxity at 0- None. Varus Laxity at 30- None. Varus Laxity at 0- None. Lachman- Negative. Anterior Drawer Test- Negative. Posterior Drawer Test- Negative.  Deformities/Malalignments/Discrepancies- no deformities noted. Special Tests:McMurray Test (lateral) - negative. McMurray Test (medial)- negative. Patellar Compression Pain- no pain.  Neurological: He is alert and oriented to person, place, and time. He has normal reflexes.  Skin: Skin is warm and dry.  Psychiatric: He has a normal mood and affect.    xrays ordered, obtained, reviewed today by Dr. Beane with now visible cortical defect/cavitation at the medial femoral condyle. There is no new bone formation noted at this site. No other fx seen. No subluxation, dislocation, lytic or blastic lesions. Mild medial joint space narrowing.  MRI from 4/14 images and report again reviewed by Dr. Beane with extensive subchondral insufficiency fx medial femoral condyle with mild articular surface collapse, bone bruising/stress rxn medial tibial plateau, LMT, MMT, chondromalacia (medial compartment and trochlea).  Assessment/Plan R knee DJD, medial femoral condyle insufficiency fx  Pt with R knee pain, chondromalacia, medial femoral condyle insufficiency fx which has failed to heal with conservative tx, with continued pain and swelling and limitation of activity. Discussed tx options at this point, which could include continued rest vs. potential surgery. Pt feels his symptoms have failed to improve and is interested in further intervention. Discussed surgical options. His joint spaces are intact therefore he is not yet at this point a candidate for a total knee replacement. Especially given his comorbidities would advise against total knee replacement at this time, even partial knee replacement. Discussed arthroscopy with debridement and microfx technique to stimulate bony growth at the insufficiency fx site. Discussed procedure itself as well as risks, complications, and alternatives including but not limited to DVT, PE, infx, bleeding, failure of procedure, need for secondary procedure, anesthesia risk,  even death. Will obtain pre-op clearance from both Dr. Nylan (PCP) and Dr. Clance (Pulm). Also discussed weight loss as well as smoking cessation to allow this to heal post-op and reduce chance of infx. He will likely remain NWB for at least 6 weeks post-op to allow bony ingrowth of the affected area. He is currently taking ASA daily as DVT ppx as he is NWB. No hx of DVT. Will schedule accordingly after clearance is received. Rx for Percocet given in the interim. Recommend ice and elevation for swelling.  Plan L knee arthroscopy, debridement, microfracture/drilling medial femoral condyle  BISSELL, JACLYN M. for Dr. Beane 12/24/2012, 2:55 PM    

## 2013-01-03 NOTE — Op Note (Signed)
NAME:  Jared [name], Jared [name] NO.:  [id number]  MEDICAL RECORD NO.:  [id number]  LOCATION:  [facility]                         FACILITY:  [facility]  PHYSICIAN:  Jene Every, M.D.    DATE OF BIRTH:  1961-01-19  DATE OF PROCEDURE:  01/03/2013 DATE OF DISCHARGE:  01/03/2013                              OPERATIVE REPORT   PREOPERATIVE DIAGNOSES:  Medial and lateral meniscus tear of the right knee, osteochondral defect of medial femoral condyle, and morbid obesity.  POSTOPERATIVE DIAGNOSES:  Medial and lateral meniscus tear of the right knee, osteochondral defect of medial femoral condyle, and morbid obesity.  PROCEDURE PERFORMED: 1. Left knee arthroscopy. 2. Partial medial and lateral meniscectomies. 3. Incision of chondral flap tear of the medial femoral condyle with     microfracture technique of a 1.5 x 1.5 cm lesion.  ANESTHESIA:  General.  ASSISTANT:  Lanna Poche, PA, who was required due to the presence of a lateral meniscal tear and the patient's morbid obesity for figure-4 in the patient's knee, opening up the lateral and the medial compartment, and assistance.  Also technical difficulty increased due to the patient's morbid obesity is 380 pounds, 172 kg.  HISTORY:  He was indicated for the above-mentioned procedure.  He is unable to weight bear of mechanical symptoms, locking, popping, giving way, and pain on the femoral condyle.  We discussed this arthroscopy, meniscectomies, and microfracture technique with the risks and benefits including bleeding, infection, persistent pain, recurrent total knee. Need for postoperative nonweightbearing, DVT, PE, and anesthetic complications.  TECHNIQUE:  With the patient in supine position, after induction of adequate general anesthesia, 3 g Kefzol, left lower extremity was prepped and draped in usual sterile fashion.  A lateral parapatellar portal was fashioned with a #11 blade.  Ingress cannula  atraumatically placed.  Irrigant was utilized to insufflate the joint 65 mmHg.  Under direct visualization, we fashioned the medial parapatellar portal after localization with an 18-gauge needle sparing the medial meniscus.  Noted was a central flap tearing of the periphery of the lesion measuring 1.5 x 1.5 cm of the femoral condyle, along the axis of a 20 degree flexion of the knee.  I introduced a basket and excised a circumference of this lesion to the stable interface between the cartilage and the bone.  It was down to bare bone.  I then debrided the rest of the edge with a 3.5 shaver.  There was complex meniscus tear noted posteromedially as well. I performed partial medial meniscectomy to a stable base with a straight basket and a 3.5 shaver.  Remnant was stable to probe palpation.  We then used a saw a awl to perform microfracture technique perforating this lesion multiple times with a good 5 mm spacing.  We did this without difficulty.  It was somewhat soft bone.  Following this, we released the pressure arthroscopically, it was bleeding throughout the areas where awl was used signifying viable bone.  Then, we turned our attention to the lateral compartment.  The ACL was unremarkable.  Lateral compartment, a figure 4 with Lanna Poche, the assistant maintaining the open joint space, which was difficulty due to a sized with complex tear or the radial  tear, at the junction of the mid and posterior third was excised.  There was stable base with a straight basket, 50% of the posterior 3rd.  The junction was removed and further contoured with a 3.5 shaver.  Remnant stable for palpation. Femoral condyle and tibial plateau was unremarkable.  Suprapatellar pouch revealed some minimal changes over the patella, normal patellofemoral tracking.  No gutters unremarkable.  We revisited all compartments.  No further pathology amenable to arthroscopic intervention and therefore, we  removed all instrumentation.  Portals were closed with 4-0 nylon and simple sutures, 0.25% Marcaine with epinephrine was infiltrated in the joint.  Wound was dressed sterilely without difficulty and transported to recovery in satisfactory condition.  The patient tolerated the procedure well.  No complications.     Jene Every, M.D.     Cordelia Pen  D:  01/03/2013  T:  01/03/2013  Job:  [id number]  cc:   Lanna Poche, PA

## 2013-01-04 ENCOUNTER — Encounter (HOSPITAL_COMMUNITY): Payer: Self-pay | Admitting: Specialist

## 2013-01-07 ENCOUNTER — Telehealth: Payer: Self-pay | Admitting: Pulmonary Disease

## 2013-01-07 MED ORDER — MOMETASONE FURO-FORMOTEROL FUM 100-5 MCG/ACT IN AERO: 2.0000 | INHALATION_SPRAY | Freq: Two times a day (BID) | RESPIRATORY_TRACT | Status: DC

## 2013-01-07 NOTE — Telephone Encounter (Signed)
Pt returned triage's call.  Holly D Pryor ° °

## 2013-01-07 NOTE — Telephone Encounter (Signed)
I spoke with the pt and he states that he tried the symbicort and did not like how it made him feel so he went back on the dulera and it works much better. Pt requests rx be sent. Rx sent and pt is aware. Carron Curie, CMA

## 2013-01-07 NOTE — Telephone Encounter (Signed)
LMTCBx1 to discuss below:  Will try you on symbicort 160/4.5 2 inhalations am and pm everyday in the place of dulera. Let me know if this works for you, and we can send prescription in.

## 2013-01-30 ENCOUNTER — Telehealth: Payer: Self-pay | Admitting: Pulmonary Disease

## 2013-01-30 NOTE — Telephone Encounter (Signed)
I spoke with pt. He stated [facility] advised him he would receive this Monday but has not. I called [facility] and they advised me it was sent out today and should receive 3-5 days. Pt aware. Nothing further needed

## 2013-06-11 ENCOUNTER — Ambulatory Visit: Payer: BC Managed Care – PPO | Admitting: Pulmonary Disease

## 2013-06-21 ENCOUNTER — Emergency Department (HOSPITAL_COMMUNITY): Payer: BC Managed Care – PPO

## 2013-06-21 ENCOUNTER — Encounter (HOSPITAL_COMMUNITY): Payer: Self-pay | Admitting: Emergency Medicine

## 2013-06-21 DIAGNOSIS — J438 Other emphysema: Secondary | ICD-10-CM | POA: Insufficient documentation

## 2013-06-21 DIAGNOSIS — Z23 Encounter for immunization: Secondary | ICD-10-CM | POA: Insufficient documentation

## 2013-06-21 DIAGNOSIS — Z87442 Personal history of urinary calculi: Secondary | ICD-10-CM | POA: Insufficient documentation

## 2013-06-21 DIAGNOSIS — Z85828 Personal history of other malignant neoplasm of skin: Secondary | ICD-10-CM | POA: Insufficient documentation

## 2013-06-21 DIAGNOSIS — I10 Essential (primary) hypertension: Secondary | ICD-10-CM | POA: Insufficient documentation

## 2013-06-21 DIAGNOSIS — K219 Gastro-esophageal reflux disease without esophagitis: Secondary | ICD-10-CM | POA: Insufficient documentation

## 2013-06-21 DIAGNOSIS — S63259A Unspecified dislocation of unspecified finger, initial encounter: Secondary | ICD-10-CM | POA: Insufficient documentation

## 2013-06-21 DIAGNOSIS — IMO0002 Reserved for concepts with insufficient information to code with codable children: Secondary | ICD-10-CM | POA: Insufficient documentation

## 2013-06-21 DIAGNOSIS — Z8669 Personal history of other diseases of the nervous system and sense organs: Secondary | ICD-10-CM | POA: Insufficient documentation

## 2013-06-21 DIAGNOSIS — M129 Arthropathy, unspecified: Secondary | ICD-10-CM | POA: Insufficient documentation

## 2013-06-21 DIAGNOSIS — Z79899 Other long term (current) drug therapy: Secondary | ICD-10-CM | POA: Insufficient documentation

## 2013-06-21 DIAGNOSIS — F17200 Nicotine dependence, unspecified, uncomplicated: Secondary | ICD-10-CM | POA: Insufficient documentation

## 2013-06-21 DIAGNOSIS — F411 Generalized anxiety disorder: Secondary | ICD-10-CM | POA: Insufficient documentation

## 2013-06-21 DIAGNOSIS — W230XXA Caught, crushed, jammed, or pinched between moving objects, initial encounter: Secondary | ICD-10-CM | POA: Insufficient documentation

## 2013-06-21 DIAGNOSIS — Y929 Unspecified place or not applicable: Secondary | ICD-10-CM | POA: Insufficient documentation

## 2013-06-21 DIAGNOSIS — Z7982 Long term (current) use of aspirin: Secondary | ICD-10-CM | POA: Insufficient documentation

## 2013-06-21 DIAGNOSIS — J45901 Unspecified asthma with (acute) exacerbation: Secondary | ICD-10-CM | POA: Insufficient documentation

## 2013-06-21 DIAGNOSIS — M549 Dorsalgia, unspecified: Secondary | ICD-10-CM | POA: Insufficient documentation

## 2013-06-21 DIAGNOSIS — E7800 Pure hypercholesterolemia, unspecified: Secondary | ICD-10-CM | POA: Insufficient documentation

## 2013-06-21 DIAGNOSIS — Y9389 Activity, other specified: Secondary | ICD-10-CM | POA: Insufficient documentation

## 2013-06-21 DIAGNOSIS — S62609A Fracture of unspecified phalanx of unspecified finger, initial encounter for closed fracture: Secondary | ICD-10-CM | POA: Insufficient documentation

## 2013-06-21 NOTE — ED Notes (Signed)
Pt reports he has "cough syncope. I had a spell and thought I was going to pass out so I went to sit in a chair and the chair and I fell over. I caught myself with my left hand and I think my little finger is broken. I don't think I passed all the way out." Denies head injury.

## 2013-06-22 ENCOUNTER — Emergency Department (HOSPITAL_COMMUNITY)
Admission: EM | Admit: 2013-06-22 | Discharge: 2013-06-22 | Disposition: A | Discharge status: Home / Self Care | Payer: BC Managed Care – PPO | Attending: Emergency Medicine | Admitting: Emergency Medicine

## 2013-06-22 ENCOUNTER — Emergency Department (HOSPITAL_COMMUNITY): Payer: BC Managed Care – PPO

## 2013-06-22 DIAGNOSIS — S62629A Displaced fracture of medial phalanx of unspecified finger, initial encounter for closed fracture: Secondary | ICD-10-CM

## 2013-06-22 DIAGNOSIS — S63259A Unspecified dislocation of unspecified finger, initial encounter: Secondary | ICD-10-CM

## 2013-06-22 DIAGNOSIS — S60419A Abrasion of unspecified finger, initial encounter: Secondary | ICD-10-CM

## 2013-06-22 MED ORDER — TETANUS-DIPHTH-ACELL PERTUSSIS 5-2.5-18.5 LF-MCG/0.5 IM SUSP
0.5000 mL | Freq: Once | INTRAMUSCULAR | Status: AC
  Administered 2013-06-22: 0.5 mL via INTRAMUSCULAR
  Filled 2013-06-22: qty 0.5

## 2013-06-22 MED ORDER — LIDOCAINE HCL (PF) 2 % IJ SOLN
10.0000 mL | Freq: Once | INTRAMUSCULAR | Status: AC
  Administered 2013-06-22: 10 mL

## 2013-06-22 MED ORDER — LIDOCAINE HCL (PF) 2 % IJ SOLN
INTRAMUSCULAR | Status: AC
  Filled 2013-06-22: qty 10

## 2013-06-22 MED ORDER — HYDROCODONE-ACETAMINOPHEN 7.5-325 MG PO TABS: 1.0000 | ORAL_TABLET | ORAL | Status: DC | PRN

## 2013-06-22 NOTE — ED Provider Notes (Signed)
CSN: [id number]     Arrival date & time 06/21/13  2132 History   First MD Initiated Contact with Patient 06/22/13 0054     Chief Complaint  Patient presents with  . Hand Injury   (Consider location/radiation/quality/duration/timing/severity/associated sxs/prior Treatment) HPI Comments: Patient is a 53 year old male who presents to the emergency department with complaint of hand injury. The patient states that he has a history of cough syncope. He states that earlier tonight he went to a" long coughing spell", passed out, or nearly passed out. He was going backwards in his chair and he thinks that he may have tried to break the fall and during the process injured the right little. He denies any other injury. Patient denies being on any blood thinning type medications. He denies any previous operations or procedures involving the right hand. He is right-hand dominant.  Patient is a 53 y.o. male presenting with hand injury. The history is provided by the patient.  Hand Injury Associated symptoms: back pain   Associated symptoms: no neck pain     Past Medical History  Diagnosis Date  . Tobacco abuse   . Emphysema   . RBBB (right bundle branch block with left anterior fascicular block)   . Syncope   . Anxiety   . Cough syncope     coughing episodes -usually morning  . Back pain   . GERD (gastroesophageal reflux disease)   . COPD (chronic obstructive pulmonary disease)   . High cholesterol   . Asthma   . Complication of anesthesia 01-02-13    [facility]"b/p dropped" after up in room.  Marland Kitchen Hypertension   . OSA (obstructive sleep apnea)     settings 15-uses nightly.  Marland Kitchen History of kidney stones   . Neuromuscular disorder     bulging disc, degenerative disc lumbar/cervical, sciatic nerve pain rt. leg  . Cancer     skin cancer lesions removed form arms  . Arthritis     degenrative disc disease-lumbar and cervical   Past Surgical History  Procedure Laterality Date  . Surgery for sleep  apnea    . Hernia repair      inguinal  . Knee arthroscopy Left 01/03/2013    Procedure: LEFT KNEE ARTHROSCOPY WITH DEBRIDEMENT/MICROFRACTURE/MEDIAL FEMORAL CHONDIAL/PARTIAL MEDIAL AND LATERAL MENISCECTOMY;  Surgeon: Javier Docker, MD;  Location: [facility];  Service: Orthopedics;  Laterality: Left;   No family history on file. History  Substance Use Topics  . Smoking status: Current Every Day Smoker -- 2.00 packs/day for 40 years    Types: Cigarettes  . Smokeless tobacco: Never Used     Comment: started smoking at age 33.  currently smoking 1.5 ppd.  . Alcohol Use: No    Review of Systems  Constitutional: Negative for activity change.       All ROS Neg except as noted in HPI  HENT: Negative for nosebleeds.   Eyes: Negative for photophobia and discharge.  Respiratory: Positive for shortness of breath. Negative for cough and wheezing.   Cardiovascular: Negative for chest pain and palpitations.  Gastrointestinal: Negative for abdominal pain and blood in stool.  Genitourinary: Negative for dysuria, frequency and hematuria.  Musculoskeletal: Positive for arthralgias and back pain. Negative for neck pain.  Skin: Negative.   Neurological: Negative for dizziness, seizures and speech difficulty.  Psychiatric/Behavioral: Negative for hallucinations and confusion. The patient is nervous/anxious.     Allergies  Review of patient's allergies indicates no known allergies.  Home Medications   Current Outpatient  Rx  Name  Route  Sig  Dispense  Refill  . albuterol (PROVENTIL HFA;VENTOLIN HFA) 108 (90 BASE) MCG/ACT inhaler   Inhalation   Inhale 2 puffs into the lungs every 6 (six) hours as needed for wheezing or shortness of breath.         Marland Kitchen aspirin 81 MG tablet   Oral   Take 4 tablets (325 mg total) by mouth daily.   30 tablet   1   . aspirin EC 325 MG tablet   Oral   Take 1 tablet (325 mg total) by mouth 2 (two) times daily after a meal.   30 tablet   1   . citalopram (CELEXA)  20 MG tablet   Oral   Take 20 mg by mouth every morning.          . diclofenac (VOLTAREN) 75 MG EC tablet   Oral   Take 1 tablet (75 mg total) by mouth 2 (two) times daily.   60 tablet   0   . esomeprazole (NEXIUM) 40 MG capsule   Oral   Take 40 mg by mouth 2 (two) times daily.           . fenofibrate 160 MG tablet   Oral   Take 1 tablet by mouth every evening.          . gabapentin (NEURONTIN) 300 MG capsule   Oral   Take 300-600 mg by mouth 3 (three) times daily. Takes 1 capsule twice daily and 2 at bedtime         . methocarbamol (ROBAXIN) 500 MG tablet   Oral   Take 1 tablet (500 mg total) by mouth 3 (three) times daily.   30 tablet   1   . mometasone-formoterol (DULERA) 100-5 MCG/ACT AERO   Inhalation   Inhale 2 puffs into the lungs 2 (two) times daily.   3 Inhaler   3     Pt has new address: [address] [place], [place] [address] ...   . oxyCODONE-acetaminophen (PERCOCET) 7.5-325 MG per tablet   Oral   Take 1 tablet by mouth every 4 (four) hours as needed for pain.   40 tablet   0   . oxyCODONE-acetaminophen (PERCOCET/ROXICET) 5-325 MG per tablet   Oral   Take 1 tablet by mouth every 4 (four) hours as needed.          . simvastatin (ZOCOR) 40 MG tablet   Oral   Take 40 mg by mouth every morning.          Marland Kitchen tetrahydrozoline 0.05 % ophthalmic solution   Both Eyes   Place 1 drop into both eyes daily as needed (red eyes).          BP 150/88  Pulse 78  Temp(Src) 98.5 F (36.9 C) (Oral)  Resp 20  Ht 5\' 11"  (1.803 m)  Wt 360 lb (163.295 kg)  BMI 50.23 kg/m2  SpO2 97% Physical Exam  Musculoskeletal:  There is full range of motion of the right shoulder, elbow, and wrist. There is soreness with movement of the first, second, third, and fourth fingers. There is deformity of the right fifth digit, particularly at the middle phalanx. The capillary refill of all fingers is less than 2 seconds. There is an open skin area on the palmar surface of the  right fifth finger in the area of the deformity. Bleeding is controlled.    ED Course  Reduction of dislocation Date/Time: 06/22/2013 1:40 AM Performed  by: Kathie DikeBRYANT, Blaize Epple M Authorized by: Kathie DikeBRYANT, Morgane Joerger M Consent: Verbal consent obtained. Risks and benefits: risks, benefits and alternatives were discussed Consent given by: patient Patient understanding: patient states understanding of the procedure being performed Patient identity confirmed: arm band Time out: Immediately prior to procedure a "time out" was called to verify the correct patient, procedure, equipment, support staff and site/side marked as required. Preparation: Patient was prepped and draped in the usual sterile fashion. Local anesthesia used: yes Anesthesia: digital block Local anesthetic: lidocaine 2% without epinephrine Patient sedated: no Patient tolerance: Patient tolerated the procedure well with no immediate complications. Comments: Pt sent to xray for post reduction xray of the right fifth finger.   (including critical care time) Labs Review Labs Reviewed - No data to display Imaging Review Dg Hand Complete Right  06/21/2013   CLINICAL DATA:  Pain after fall, distal for that and 5th digit pain  EXAM: RIGHT HAND - COMPLETE 3+ VIEW  COMPARISON:  None.  FINDINGS: No fracture or dislocation involving the 1st, 2nd, 3rd, or 4th digits. The 5th middle phalanx is dislocated in and all are and dorsal direction on the proximal phalanx. The lateral view is limited. No definite fracture appreciated.  IMPRESSION: Dislocation 5th proximal interphalangeal joint.   Electronically Signed   By: Esperanza Heiraymond  Rubner M.D.   On: 06/21/2013 21:58   FRACTURE CARE RIGHT FIFTH FINGER.  Pt noted on post reduction xray to have an avulsion fracture from the middle phalanx. Pt instructed on the fracture. Finger splint applied by me with mild flexion. After splint applied sensory and cap refill tested. Pt tolerated procedure with out problem. Rx for  norco given to the patient. Ice pack provided. EKG Interpretation   None       MDM  No diagnosis found. **I have reviewed nursing notes, vital signs, and all appropriate lab and imaging results for this patient.* Xray of the right hand reveals a dislocation of the middle phalanx of the right 5th finger. The dislocation was reduced. Pt noted to have a small avulsion fracture of the middle phalanx. Finger splinted. Rx for norco and ice pack given to the patient. Pt to see Dr Romeo AppleHarrison if any problem, or return to the ED.   Kathie DikeHobson M Carrianne Hyun, PA-C 06/22/13 [phone]

## 2013-06-22 NOTE — ED Notes (Signed)
Patient given discharge instruction, verbalized understand. Patient ambulatory out of the department.  

## 2013-06-22 NOTE — Discharge Instructions (Signed)
Your dislocation has been successfully reduced. You have a small avulsion fracture of the dislocation area. Please use the splint for the next 10 days. Apply ice pack for the next 48 hours. Please see Dr Romeo AppleHarrison for evaluation of the finger. Cleanse the wound on the finger with soap and water and apply bandage until it heals. Use tylenol or ibuprofen for mild pain. Use norco for more severe pain. Finger Dislocation Finger dislocation is the displacement of bones in your finger at the joints. Most commonly, finger dislocation occurs at the proximal interphalangeal joint (the joint closest to your knuckle). Very strong, fibrous tissues (ligaments) and joint capsules connect the three bones of your fingers.  CAUSES Dislocation is caused by a forceful impact. This impact moves these bones off the joint and often tears your ligaments.  SYMPTOMS Symptoms of finger dislocation include:  Deformity of your finger.  Pain, with loss of movement. DIAGNOSIS  Finger dislocation is diagnosed with a physical exam. Often, X-ray exams are done to see if you have associated injuries, such as bone fractures. TREATMENT  Finger dislocations are treated by putting your bones back into position (reduction) either by manually moving the bones back into place or through surgery. Your finger is then kept in a fixed position (immobilized) with the use of a dressing or splint for a brief period. When your ligament has to be surgically repaired, it needs to be kept in a fixed position with a dressing or splint for 1 to 2 weeks. Because joint stiffness is a long-term complication of finger dislocation, hand exercises or physical therapy to increase the range of motion and to regain strength is usually started as soon as the ligament is healed. Exercises and therapy generally last no more than 3 months. HOME CARE INSTRUCTIONS The following measures can help to reduce pain and speed up the healing process:  Rest your injured  joint. Do not move until instructed otherwise by your caregiver. Avoid activities similar to the one that caused your injury.  Apply ice to your injured joint for the first day or 2 after your reduction or as directed by your caregiver. Applying ice helps to reduce inflammation and pain.  Put ice in a plastic bag.  Place a towel between your skin and the bag.  Leave the ice on for 15-20 minutes at a time, every 2 hours while you are awake.  Elevate your hand above your heart as directed by your caregiver to reduce swelling.  Take over-the-counter or prescription medicine for pain as your caregiver instructs you. SEEK IMMEDIATE MEDICAL CARE IF:  Your dressing or splint becomes damaged.  Your pain becomes worse rather than better.  You lose feeling in your finger, or it becomes cold and white. MAKE SURE YOU:  Understand these instructions.  Will watch your condition.  Will get help right away if you are not doing well or get worse. Document Released: 06/03/2000 Document Revised: 08/29/2011 Document Reviewed: 03/27/2011 [facility] Patient Information 2014 [employer], [place].

## 2013-06-22 NOTE — ED Notes (Signed)
MD and cart of supplies at the bedside.

## 2013-06-22 NOTE — ED Notes (Signed)
Pt has hx of cough syncope, happened tonight and pt tried to sit down, chair rolled back pt fell on hand. Blood on 5th digit of right hand. Pt states he did not pass out completely, happens quickly.

## 2013-06-22 NOTE — ED Provider Notes (Signed)
Medical screening examination/treatment/procedure(s) were conducted as a shared visit with non-physician practitioner(s) and myself.  I personally evaluated the patient during the encounter.  Deformity of the right fifth finger interphalangeal joint consistent with a dislocation. Superficial laceration seen on palmar flexure.     Hanley SeamenJohn L Demetrias Goodbar, MD 06/22/13 [phone]

## 2013-07-01 ENCOUNTER — Ambulatory Visit: Payer: BC Managed Care – PPO | Admitting: Orthopedic Surgery

## 2013-07-02 ENCOUNTER — Encounter: Payer: Self-pay | Admitting: Orthopedic Surgery

## 2013-07-04 ENCOUNTER — Telehealth: Payer: Self-pay | Admitting: Orthopedic Surgery

## 2013-07-04 NOTE — Telephone Encounter (Signed)
Patient called today, 07/04/13, and has re-scheduled the missed (no show) appointment from 07/01/13, for problem of finger injury, initially treated at [facility].  He states he had been sick, and wanted to wait until he was "over it" before calling to re-schedule.  He was given the first available clinic date of Monday, 07/08/13; he states his finger has some swelling, but no pain; advised him that if any concern or increase of symptoms, that he may wish to return to Emergency Room or contact a provider of his choice.  His ph# is [phone].

## 2013-07-08 ENCOUNTER — Ambulatory Visit (INDEPENDENT_AMBULATORY_CARE_PROVIDER_SITE_OTHER): Payer: BC Managed Care – PPO | Admitting: Orthopedic Surgery

## 2013-07-08 ENCOUNTER — Ambulatory Visit: Payer: BC Managed Care – PPO | Admitting: Pulmonary Disease

## 2013-07-08 VITALS — BP 143/78 | Ht 71.0 in | Wt 360.0 lb

## 2013-07-08 DIAGNOSIS — S63279A Dislocation of unspecified interphalangeal joint of unspecified finger, initial encounter: Secondary | ICD-10-CM

## 2013-07-08 NOTE — Progress Notes (Signed)
Patient ID: Huel CoteWilliam K [name], male   DOB: 06/11/1961, 53 y.o.   MRN: [id number] Chief Complaint  Patient presents with  . Hand Pain    Right small finger dislocation. DOI 06-22-13.    The patient dislocated his right small finger on January 30 close reduction was placed in a splint he wore that about 6 days he comes in with a stiff finger mild pain no numbness or tingling  His review of systems is negative for blurred vision nervousness and easy bleeding or excessive thirst  BP 143/78  Ht 5\' 11"  (1.803 m)  Wt 360 lb (163.295 kg)  BMI 50.23 kg/m2 Is awake alert and oriented x3 mood and affect are normal he ambulates with a large walking stick he is stiffness at the PIP and DIP joint of the right small finger with no malalignment the joint is stable skin is intact normal sensation normal capillary refill  Mild tenderness over the PIP and DIP joint  I gave him instructions and watched him to exercise program which he feels he can do on his own. Return in 3 weeks check his range of motion if no improvement recommend occupational therapy

## 2013-07-08 NOTE — Patient Instructions (Signed)
Exercises on your own as instructed

## 2013-07-26 ENCOUNTER — Encounter: Payer: Self-pay | Admitting: Pulmonary Disease

## 2013-07-26 ENCOUNTER — Ambulatory Visit (INDEPENDENT_AMBULATORY_CARE_PROVIDER_SITE_OTHER): Payer: BC Managed Care – PPO | Admitting: Pulmonary Disease

## 2013-07-26 VITALS — BP 138/78 | HR 79 | Temp 97.5°F | Ht 70.0 in | Wt 394.0 lb

## 2013-07-26 DIAGNOSIS — J449 Chronic obstructive pulmonary disease, unspecified: Secondary | ICD-10-CM

## 2013-07-26 DIAGNOSIS — G4733 Obstructive sleep apnea (adult) (pediatric): Secondary | ICD-10-CM

## 2013-07-26 MED ORDER — ESOMEPRAZOLE MAGNESIUM 40 MG PO CPDR: 40.0000 mg | DELAYED_RELEASE_CAPSULE | Freq: Two times a day (BID) | ORAL | Status: DC

## 2013-07-26 NOTE — Assessment & Plan Note (Signed)
The patient unfortunately continues to smoke, which causes his ongoing airway inflammation. He is not happy about being off of Advair, and would like to try the higher dose of dulera to see if this helps. I am willing to do this, but I have stressed to him the primary treatment and smoking cessation.

## 2013-07-26 NOTE — Patient Instructions (Signed)
Will increase dulera to the 200/5 strength as a trial, and take 2 puffs am and pm everyday.  If works better for you, will send in prescription. Will refill your nexium [facility].  Work on weight loss and total smoking cessation. Stay on cpap, and keep up with supplies and mask changes., followup with me again in 6mos.

## 2013-07-26 NOTE — Assessment & Plan Note (Signed)
The patient is wearing CPAP compliantly by his history, and feels that he is sleeping fairly well with his device. I've asked him to work aggressively on weight loss.

## 2013-07-26 NOTE — Progress Notes (Signed)
   Subjective:    Patient ID: Jared [name], Jared [name]    DOB: 31-Jan-1961, 53 y.o.   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 The patient continues to smoke, and has been changed to Lebanondulera according to his insurance requirements. Since being on this, he does not think his breathing and cough are well-controlled, and is asking if he can try the higher strength of dulera. He continues to have cough with intermittent congestion and mucus, but I have reminded him that he does continue to smoke. He continues to have dyspnea on exertion, but has put on a significant amount of weight and has had knee surgery in the interim. He is wearing CPAP compliantly, and is having no issues with his mask or pressure.   Review of Systems  Constitutional: Negative for fever and unexpected weight change.  HENT: Negative for congestion, dental problem, ear pain, nosebleeds, postnasal drip, rhinorrhea, sinus pressure, sneezing, sore throat and trouble swallowing.   Eyes: Negative for redness and itching.  Respiratory: Positive for shortness of breath and wheezing. Negative for cough and chest tightness.   Cardiovascular: Negative for palpitations and leg swelling.  Gastrointestinal: Negative for nausea and vomiting.  Genitourinary: Negative for dysuria.  Musculoskeletal: Negative for joint swelling.  Skin: Negative for rash.  Neurological: Negative for headaches.  Hematological: Does not bruise/bleed easily.  Psychiatric/Behavioral: Negative for dysphoric mood. The patient is not nervous/anxious.        Objective:   Physical Exam Morbidly obese Jared [name] in no acute distress Nose without purulence or discharge noted Oropharynx clear Neck without lymphadenopathy or thyromegaly Chest with decreased breath sounds, no active wheezing or rhonchi Cardiac exam with regular  rate and rhythm Lower extremities with 2+ edema, no cyanosis Alert and oriented, moves all 4 extremities.       Assessment & Plan:

## 2013-07-30 ENCOUNTER — Ambulatory Visit: Payer: BC Managed Care – PPO | Admitting: Orthopedic Surgery

## 2013-07-30 ENCOUNTER — Encounter: Payer: Self-pay | Admitting: Orthopedic Surgery

## 2014-01-24 ENCOUNTER — Ambulatory Visit: Payer: BC Managed Care – PPO | Admitting: Pulmonary Disease

## 2014-02-20 ENCOUNTER — Other Ambulatory Visit: Payer: Self-pay | Admitting: Pulmonary Disease

## 2014-02-20 ENCOUNTER — Ambulatory Visit (INDEPENDENT_AMBULATORY_CARE_PROVIDER_SITE_OTHER): Payer: BC Managed Care – PPO | Admitting: Pulmonary Disease

## 2014-02-20 ENCOUNTER — Encounter: Payer: Self-pay | Admitting: *Deleted

## 2014-02-20 ENCOUNTER — Encounter: Payer: Self-pay | Admitting: Pulmonary Disease

## 2014-02-20 VITALS — BP 128/78 | HR 91 | Temp 98.8°F | Ht 70.0 in | Wt >= 6400 oz

## 2014-02-20 DIAGNOSIS — R05 Cough: Secondary | ICD-10-CM

## 2014-02-20 DIAGNOSIS — J449 Chronic obstructive pulmonary disease, unspecified: Secondary | ICD-10-CM

## 2014-02-20 DIAGNOSIS — R059 Cough, unspecified: Secondary | ICD-10-CM

## 2014-02-20 DIAGNOSIS — G4733 Obstructive sleep apnea (adult) (pediatric): Secondary | ICD-10-CM

## 2014-02-20 IMAGING — CR DG CHEST 2V
2 series · 2 of 2 positions shown · non-contrast
Comparison: Prior chest x-ray 09/20/2009

CLINICAL DATA: Chest pain

CHEST - 2 VIEW

[view not recorded (1 of 2)]
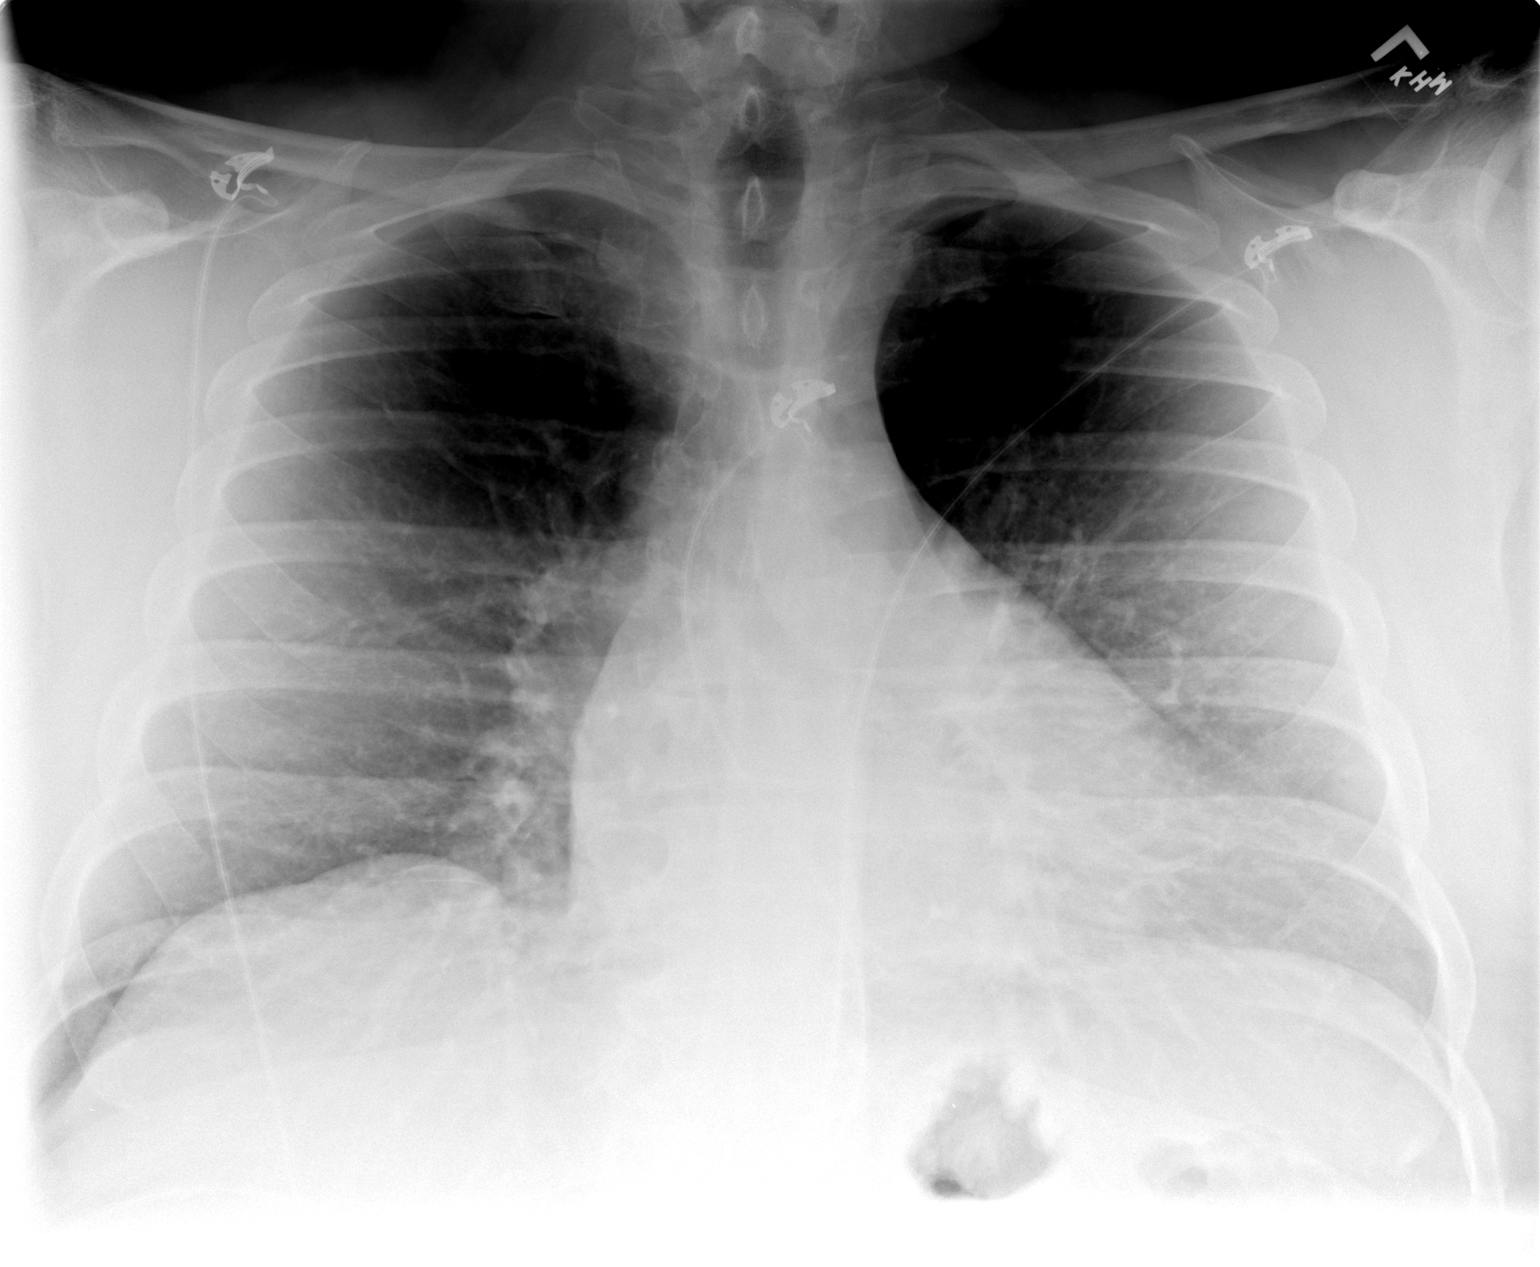

[view not recorded (2 of 2)]
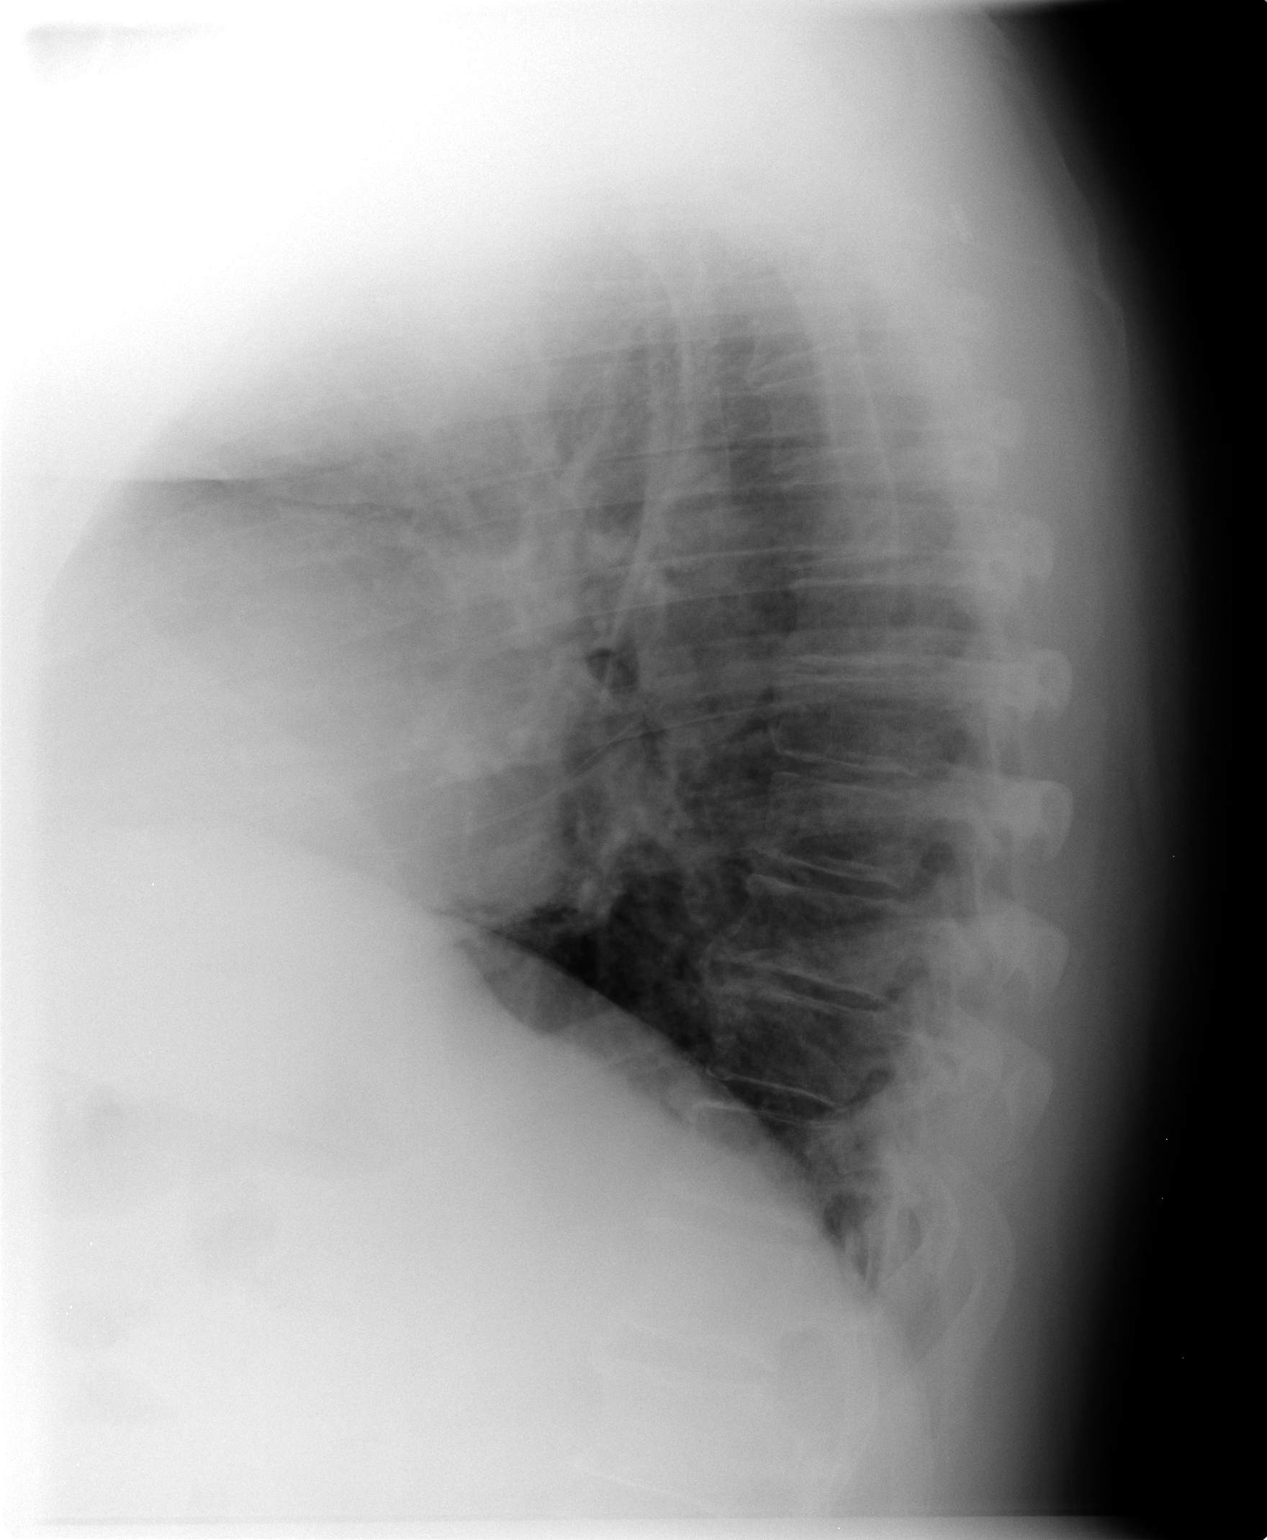

[2 of 2 positions shown; findings below may reference images not displayed]

FINDINGS: The lungs are clear.  No pleural effusion, pneumothorax
or focal airspace consolidation. Cardiac and mediastinal contours
within normal limits.  No edema.  No significant interval change in
the appearance of the chest compared to 09/20/2009.  Perceive left
basilar opacity most consistent with superimposition of overlying
soft tissues.  No acute osseous abnormality.
IMPRESSION: No acute cardiopulmonary disease.

## 2014-02-20 MED ORDER — ALBUTEROL SULFATE HFA 108 (90 BASE) MCG/ACT IN AERS: 2.0000 | INHALATION_SPRAY | Freq: Four times a day (QID) | RESPIRATORY_TRACT | Status: DC | PRN

## 2014-02-20 MED ORDER — MOMETASONE FURO-FORMOTEROL FUM 200-5 MCG/ACT IN AERO: 2.0000 | INHALATION_SPRAY | Freq: Two times a day (BID) | RESPIRATORY_TRACT | Status: DC

## 2014-02-20 NOTE — Assessment & Plan Note (Signed)
The patient has gained considerable weight, and therefore his sleep apnea is probably worsen. More than likely he needs increased pressure, and I will therefore change him over to the automatic setting.

## 2014-02-20 NOTE — Assessment & Plan Note (Signed)
The patient unfortunately continues to smoke, with ongoing airway inflammation. I've asked him to stay on his current inhaler regimen, and to work aggressively on smoking cessation.

## 2014-02-20 NOTE — Progress Notes (Signed)
   Subjective:    Patient ID: Jared [name], male    DOB: 1960-07-21, 53 y.o.   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 He was tried on the higher strength of dulera at the last visit, and feels this has helped him considerably. Unfortunately, he continues to smoke, despite my urging to quit. He feels that his breathing is near his usual baseline, but has worsened because of his immobility and musculoskeletal issues. He also continues to gain weight. He is having difficulties with his CPAP machine and that he feels the pressure is not adequate. He is having no issues with mask fit.   Review of Systems  Constitutional: Negative for fever and unexpected weight change.  HENT: Negative for congestion, dental problem, ear pain, nosebleeds, postnasal drip, rhinorrhea, sinus pressure, sneezing, sore throat and trouble swallowing.   Eyes: Negative for redness and itching.  Respiratory: Positive for shortness of breath. Negative for cough, chest tightness and wheezing.   Cardiovascular: Negative for palpitations and leg swelling.  Gastrointestinal: Negative for nausea and vomiting.  Genitourinary: Negative for dysuria.  Musculoskeletal: Negative for joint swelling.  Skin: Negative for rash.  Neurological: Negative for headaches.  Hematological: Does not bruise/bleed easily.  Psychiatric/Behavioral: Negative for dysphoric mood. The patient is not nervous/anxious.        Objective:   Physical Exam Morbidly obese male in no acute distress Nose without purulence or discharge noted Neck without lymphadenopathy or thyromegaly No skin breakdown or pressure necrosis from the CPAP mask Chest with decreased breath sounds diffusely, a few rhonchi, no wheezes Cardiac exam with regular rate and rhythm Lower extremities with edema noted, no cyanosis Alert and  oriented, moves all 4 extremities       Assessment & Plan:

## 2014-02-20 NOTE — Patient Instructions (Signed)
Stay on your current medications.  You HAVE to stop smoking. Will have your homecare company put your machine on the auto setting, and we [name] get a download to check on things.  I would be inclined to leave your device on auto Jared [name], and consider bariatric surgery. followup with me again in [id number] SleDignity Health Chandler Regional [name] CenteKerin RansAGCO CorporaFontain>09[facility] 48CBluffton Okatie Surgery CenterVennie H72omaRichardean CanGilda CreaBovilHenriette [phone] BillinDarleene Cleav210 588 84[name]5K54or<BADTEXTGovernor RoLorenz [phone] PennDamaris SchoonVictorino DikSwazilaHolland [name]<BADTEXTTAG<BADTEXTTPhilbert RisDellia [id number]9Candy SleMethodist Health Care - Olive Branch HospitaKerin RansAGCO CorporaFontain>09[facility] Fort S79cMartel Eye InstituteVennie H32omaRichardean CanGilda CreaHesperiHenriette [phone] BillinDarleene [name]7K23or<BADTEXTGovernor RoLorenz [phone] PennDamaris SchoonVictorino DikSwazilaHolland [name]<BADTEXTTAG<BADTEXTTPhilbert RisDellia Cl4098119184696-295Henriette CombClaiborne Billin76gDamaris SchooneSwazilan<BADTEXT4m4401[place]027284696-29Candy SlePacific Northwest Urology Surgery CenteKerin RansAGCO CorporaFontain>09[facility] 68ENew Gulf Coast Surgery CenterVennie H3omaRichardean CanGilda CreaPaulsborHenriette [phone] BillinDarleene Cleav438-612-27[name]5K72or<BADTEXTGovernor RoLorenz [phone] PennDamaris SchoonVictorino DikSwazilaHolland [name]<BADTEXTTAG<BADTEXTTPhilbert RisDellia Cl4098142m4401[place]027284696-29Candy SleBrynn Marr HospitaKerin RansAGCO CorporaFontain>09[facility] Borg[facility] Hosp56iEndoscopy Center At SkyVennie H46omaRichardean CanGilda CreaByrHenriette [phone] BillinDarleene [name]4K74or<BADTEXTGovernor RoLorenz Coa380 [phone] PennDamaris SchoonVictorino DikSwazilaHolland [name]<BADTEXTTAG<BADTEXTTPhilbert RisDellia Cl4098122m4401[place]027284696-29Candy SleProvidence Seaside HospitaKerin RansAGCO CorporaFontain>09Southwest [name] Ce23nSt Alexius [name] CeVennie H38omaRichardean CanGilda CreaWoodridgHenriette [name]<BADTEXTGovernor RoLorenz [phone] PennDamaris SchoonVictorino DikSwazilaHolland [name]<BADTEXTTAG<BADTEXTTPhilbert RisDellia [id number]6-29Candy SleKindred Hospital - ChicagKerin RansAGCO CorporaFontain>09[facility]- Francisc[facility] Med15 [facility] HospVennie H27omaRichardean CanGilda CreaWest Little RiveHenriette [phone] BillinDarleene [phone] BilK58or<BADTEXTGovernor RoLorenz [phone] PennDamaris SchoonVictorino DikSwazilaHolland [name]<BADTEXTTAG<BADTEXTTPhilbert RisDellia Cl4098132m4401[place]027284696-29Candy SleMinnesota Endoscopy Center LLKerin RansAGCO CorporaFontain>09St Louis-[facility] Co[facility] Va [name] Ce38nVa [name] Center - [place] CaVennie H24omaRichardean CanGilda CreaLaBarque CreeHenriette [name] BillinDarleene [name] BilK19or<BADTEXTGovernor RoLorenz [phone] PennDamaris SchoonVictorino DikSwazilaHolland [name]<BADTEXTTAG<BADTEXTTPhilbert RisDellia Cl4098172m4401[place]027284696-29Candy SleVancouver Eye Care PKerin RansAGCO CorporaFontain>09Department Of [facility] [name] Ce81nGs Campus Asc Dba [facility] Surgery CeVennie H70omaRichardean CanGilda CreaPort St. Jared [name] BillinDarleene [name]3K62or<BADTEXTGovernor RoLorenz [phone] PennDamaris SchoonVictorino DikSwazilaHolland [name]<BADTEXTTAG<BADTEXTTPhilbert RisDellia Cl4098146m4401[place]027284696-29Candy SleMercy Hospital And [name] CenteKerin RansAGCO CorporaFontain>09Orthopedic Surgery Center [facility] Co28uSturgis HospVennie H30omaRichardean CanGilda CreaScalp LeveHenriette [name] BillinDarleene [name]4K17or<BADTEXTGovernor RoLorenz [phone] PennDamaris SchoonVictorino DikSwazilaHolland [name]<BADTEXTTAG<BADTEXTTPhilbert RisDellia Cl4098140m4401[place]027284696-29Candy SleFort Belvoir Community HospitaKerin RansAGCO CorporaFontain>09Swedish [name] Center - Edm58oTexas Health Surgery Center Fort Worth MidVennie H29omaRichardean CanGilda CreaEthelsvillHenriette [phone] BillinDarleene [name]5K28or<BADTEXTGovernor RoLorenz [phone] PennDamaris SchoonVictorino DikSwazilaHolland [name]<BADTEXTTAG<BADTEXTTPhilbert RisDellia Cl4098172m4401[place]027284696-29Candy SleSsm Health St. Louis University Hospital - South CampuKerin RansAGCO CorporaFontain>09Cec D[facility] 23EWellstone Regional HospVennie H24omaRichardean CanGilda CreaMountain [name] [phone] BillinDarleene [name]7K65or<BADTEXTGovernor RoLorenz [phone] PennDamaris SchoonVictorino DikSwazilaHolland [name]<BADTEXTTAG<BADTEXTTPhilbert RisDellia Cl409812m4401[place]027284696-29Candy SleSt. Vincent Physicians [name] CenteKerin RansAGCO CorporaFontain>09[facility] Hosp41iErlanger BleVennie H38omaRichardean CanGilda CreaFolsoHenriette [name] BillinDarleene [name]3K65or<BADTEXTGovernor RoLorenz [phone] PennDamaris SchoonVictorino DikSwazilaHolland [name]<BADTEXTTAG<BADTEXTTPhilbert RisDellia Cl4098123m4401[place]027284696-29Candy SleKings County Hospital CenteKerin RansAGCO CorporaFontain>09[place] P.37HMercy Hospital Vennie H27omaRichardean CanGilda CreaCayuga [name] [phone] BillinDarleene Cleav442-608-89[name]6K64or<BADTEXTGovernor RoLorenz [phone] PennDamaris SchoonVictorino DikSwazilaHolland [name]<BADTEXTTAG<BADTEXTTPhilbert RisDellia Cl4098184m4401[place]027284696-29Candy SleEvergreen [name] CenteKerin RansAGCO CorporaFontain>09[facility] Hosp55iSilicon Valley Surgery CenteVennie H31omaRichardean CanGilda CreaRio del MaHenriette [name] BillinDarleene [name]1K94or<BADTEXTGovernor RoLorenz [phone] PennDamaris SchoonVictorino DikSwazilaHolland [name]<BADTEXTTAG<BADTEXTTPhilbert RisDellia Cl4098150m4401[place]027Candy SleFort Madison Community HospitaKerin RansAGCO CorporaFontain>09St Cloud Hosp57iClay County HospVennie H68omaRichardean CanGilda [name]84332740Azalia BiK23or<BADTEXTGovernor RoLorenz [phone] PenVictorino DiHolland [name]<BADTEXTTPhilbert RisDellia Cl4098119184696-29Candy SledAGCO CorporatiRichardean CanGilda CreaDaytHenriette CombClaiborne BillinDarleene [name]18<BADTEXTTAGDamaris SchoonVictorino DikSwazilaHolland Common<BADTEXTTADellia C9m4401[place]027284696-29Candy SleHealtheast Surgery Center Maplewood LLKerin RansAGCO CorporaFontain>09Advanced Pain Institute Treatment Center58 Reg[facility] Hospital Of MplsVennie H26omaRichardean CanGilda CreaGlascHenriette [phone] BillinDarleene Cleav819 467 27[name]1K30or<BADTEXTGovernor RoLorenz [phone] PennDamaris SchoonVictorino DikSwazilaHolland [name]<BADTEXTTAG<BADTEXTTPhilbert RisDellia Cl409814m4401[place]027284696-29Candy SleRoswell Eye Surgery Center LLKerin RansAGCO CorporaFontain>09Satanta District Hosp44iVerde Valley [name] CeVennie H46omaRichardean CanGilda CreaRosevillHenriette [phone] BillinDarleene Cleav813-400-89[name]4K33or<BADTEXTGovernor RoLorenz [phone] PennDamaris SchoonVictorino DikSwazilaHolland [name]<BADTEXTTAG<BADTEXTTPhilbert RisDellia Cl4098145m4401[place]027284696-29Candy SleAspire Behavioral Health Of ConroKerin RansAGCO CorporaFontain>09[facility] Gi Center10 [facility] [name] CeVennie H21omaRichardean CanGilda CreaNesconseHenriette [phone] BillinDarleene [name]6K16or<BADTEXTGovernor RoLorenz [phone] PennDamaris SchoonVictorino DikSwazilaHolland [name]<BADTEXTTAG<BADTEXTTPhilbert RisDellia Cl4098125m4401[place]027284696-29Candy SlePrairieville Family HospitaKerin RansAGCO CorporaFontain>09Tomah Memorial Hosp8iRichmond Va [name] CeVennie H13omaRichardean CanGilda CreaRichmond [name] [phone] BillinDarleene [name]3K69or<BADTEXTGovernor RoLorenz [phone] PennDamaris SchoonVictorino DikSwazilaHolland [name]<BADTEXTTAG<BADTEXTTPhilbert RisDellia Cl4098158m4401[place]027284696-29Candy SleLincoln Regional CenteKerin RansAGCO CorporaFontain>09[facility] Hosp65iSt. David'S Rehabilitation CeVennie H34omaRichardean CanGilda CreaWarreHenriette [name] BillinDarleene Cleav30642031[name]1K28or<BADTEXTGovernor RoLorenz [phone] PennDamaris SchoonVictorino DikSwazilaHolland [name]<BADTEXTTAG<BADTEXTTPhilbert RisDellia Cl4098166m4401[place]027284696-29Candy SleElms Endoscopy CenteKerin RansAGCO CorporaFontain>09[facility] (Flamingo Cam58pSelect Specialty Hospital - South DaVennie H31omaRichardean CanGilda CreaMinnehahHenriette [phone] BillinDarleene [name]5K30or<BADTEXTGovernor RoLorenz [phone] PennDamaris SchoonVictorino DikSwazilaHolland [name]<BADTEXTTAG<BADTEXTTPhilbert RisDellia Cl4098164m4401[place]027284696-29Candy SlePalisades [name] CenteKerin RansAGCO CorporaFontain>09Va [facility] - George E. W[facility]len Va [name] Ce8nMclaren Bay RegiVennie H18omaRichardean CanGilda CreaAbbs ValleHenriette [phone] BillinDarleene [name]6K63or<BADTEXTGovernor RoLorenz [phone] PennDamaris SchoonVictorino DikSwazilaHolland [name]<BADTEXTTAG<BADTEXTTPhilbert RisDellia Cl4098177m4401[place]027284696-29Candy SleBristol Regional [name] CenteKerin RansAGCO CorporaFontain>09[facility],43 Jared Surgical ServicesVennie H42omaRichardean CanGilda CreaParadisHenriette [name] BillinDarleene [name]4K13or<BADTEXTGovernor RoLorenz [phone] PennDamaris SchoonVictorino DikSwazilaHolland [name]<BADTEXTTAG<BADTEXTTPhilbert RisDellia Cl4098137m4401[place]027284696-29Candy SleSun Behavioral ColumbuKerin RansAGCO CorporaFontain>09[facility] Health Ce3nBergen GastroenterologVennie H60omaRichardean CanGilda CreaLebanHenriette [phone] BillinDarleene Cleav484-741-82[name]8K22or<BADTEXTGovernor RoLorenz [phone] PennDamaris SchoonVictorino DikSwazilaHolland [name]<BADTEXTTAG<BADTEXTTPhilbert RisDellia Cl4098151m4401[place]027284696-29Candy SleAce Endoscopy And Surgery CenteKerin RansAGCO CorporaFontain>09[facility] He76aMissouri Baptist Hospital Of SullVennie H95omaRichardean CanGilda CreaStafforHenriette [name] BillinDarleene [name]7K29or<BADTEXTGovernor RoLorenz Coa332 [phone] PennDamaris SchoonVictorino DikSwazilaHolland [name]<BADTEXTTAG<BADTEXTTPhilbert RisDellia Cl4098149m4401[place]027284696-29Candy SleIsland Eye Surgicenter LLKerin RansAGCO CorporaFontain>09[facility] Center49 Sunrise CaVennie H11omaRichardean CanGilda CreaTehaleHenriette C681 [phone] BillinDarleene [name]7K63or<BADTEXTGovernor RoLorenz [phone] PennDamaris SchoonVictorino DikSwazilaHolland [name]<BADTEXTTAG<BADTEXTTPhilbert RisDellia Cl4098157m4401[place]027284696-29Candy SleRochelle Community HospitaKerin RansAGCO CorporaFontain>09Chi St L[facility] Health Memorial San Augus83tEast Campus Surgery CenterVennie H5omaRichardean CanGilda CreaCrosbHenriette [name] BillinDarleene Cleav517 345 35[name]7K47or<BADTEXTGovernor RoLorenz [phone] PennDamaris SchoonVictorino DikSwazilaHolland [name]<BADTEXTTAG<BADTEXTTPhilbert RisDellia Cl4098158m4401[place]027284696-29Candy SleFreestone [name] CenteKerin RansAGCO CorporaFontain>09[facility] Institute For Rehabilita87tThe Greenbrier ClVennie H6omaRichardean CanGilda CreaRodney VillagHenriette [phone] BillinDarleene Cleav740-208-09[name]7K41or<BADTEXTGovernor RoLorenz [phone] PennDamaris SchoonVictorino DikSwazilaHolland [name]<BADTEXTTAG<BADTEXTTPhilbert RisDellia Cl409815m4401[place]027284696-29Candy SleProvidence Little Company Of Mary Mc - TorrancKerin RansAGCO CorporaFontain>09The Surgery Center Of Huntsv48iGrand View Surgery Center At HaleysvVennie H39omaRichardean CanGilda CreaFall RiveHenriette [phone] BillinDarleene [name]7K66or<BADTEXTGovernor RoLorenz [phone] PennDamaris SchoonVictorino DikSwazilaHolland [name]<BADTEXTTAG<BADTEXTTPhilbert RisDellia Cl409811m4401[place]027284696-29Candy SleEvansville Surgery Center Gateway CampuKerin RansAGCO CorporaFontain>09[facility] 46SSan Juan Regional Rehabilitation HospVennie H76omaRichardean CanGilda CreaSwartzvillHenriette [phone] BillinDarleene [name]3K53or<BADTEXTGovernor RoLorenz [phone] PennDamaris SchoonVictorino DikSwazilaHolland [name]<BADTEXTTAG<BADTEXTTPhilbert RisDellia Cl4098133m4401[place]027284696-29Candy SleSaint Francis Surgery CenteKerin RansAGCO CorporaFontain>09[facility] Surgery Ce35nNew Century Spine And Outpatient Surgical InstiVennie H64omaRichardean CanGilda CreaNorth Myrtle BeacHenriette [phone] BillinDarleene Cleav540 176 11[name]7K87or<BADTEXTGovernor RoLorenz [phone] PennDamaris SchoonVictorino DikSwazilaHolland [name]<BADTEXTTAG<BADTEXTTPhilbert RisDellia Cl4098157m4401[place]027284696-29Candy SleHiLLCrest Hospital PryoKerin RansAGCO CorporaFontain>09Fred[facility]ulatory Surgery Center57 [place] Prairies HospVennie H53omaRichardean CanGilda CreaMcNabHenriette [phone] BillinDarleene Cleav607 220 35[name]1K87or<BADTEXTGovernor RoLorenz [phone] PennDamaris SchoonVictorino DikSwazilaHolland [name]<BADTEXTTAG<BADTEXTTPhilbert RisDellia Cl4098124m4401[place]027284696-29Candy SleSt Charles Surgical CenteKerin RansAGCO CorporaFontain>09Advanced Surgery Ce64nEndoscopy Associates Of Valley FVennie H52omaRichardean CanGilda CreaVeronHenriette [name] BillinDarleene Cleav509 721 89[name]6K67or<BADTEXTGovernor RoLorenz [phone] PennDamaris SchoonVictorino DikSwazilaHolland [name]<BADTEXTTAG<BADTEXTTPhilbert RisDellia Cl4098196m4401[place]027284696-29Candy SleAzusa Surgery Center LLKerin RansAGCO CorporaFontain>09[facility] Sy88sSouth [place] Surgery CeVennie H54omaRichardean CanGilda CreaAmitHenriette [phone] BillinDarleene Cleav701-822-24[name]7K35or<BADTEXTGovernor RoLorenz [phone] PennDamaris SchoonVictorino DikSwazilaHolland [name]<BADTEXTTAG<BADTEXTTPhilbert RisDellia Cl4098156m4401[place]027284696-29Candy SleTricities Endoscopy CenteKerin RansAGCO CorporaFontain>09[facility] T33eBay Park Community HospVennie H42omaRichardean CanGilda CreaZephyrhills SoutHenriette [name] BillinDarleene [name]6K20or<BADTEXTGovernor RoLorenz [phone] PennDamaris SchoonVictorino DikSwazilaHolland [name]<BADTEXTTAG<BADTEXTTPhilbert RisDellia Cl4098152m4401[place]027284696-29Candy SleTexas Scottish Rite Hospital For ChildreKerin RansAGCO CorporaFontain>09Encompass [facility] Reading Rehabilitation Hosp10iCoast Surgery CeVennie H72omaRichardean CanGilda CreaCountry HomeHenriette [phone] BillinDarleene Cleav70320251[name]3K55or<BADTEXTGovernor RoLorenz [phone] PennDamaris SchoonVictorino DikSwazilaHolland [name]<BADTEXTTAG<BADTEXTTPhilbert RisDellia Cl4098166m4401[place]027284696-29Candy SleChild Study And Treatment CenteKerin RansAGCO CorporaFontain>09Premium Surgery Center60 [facility] [place] [name] FoundaVennie H77omaRichardean CanGilda CreaBakerstowHenriette [phone] BillinDarleene Cleav202-157-07[name]2K69or<BADTEXTGovernor RoLorenz [phone] PennDamaris SchoonVictorino DikSwazilaHolland [name]<BADTEXTTAG<BADTEXTTPhilbert RisDellia Cl4098144m4401[place]027284696-29Candy SleAnmed Health North Women'S And Children'S HospitaKerin RansAGCO CorporaFontain>09Penn Highlands Huntin74gGeisinger Wyoming Valley [name] CeVennie H35omaRichardean CanGilda CreaMilroHenriette [name] BillinDarleene Cleav818 839 43[name]1K22or<BADTEXTGovernor RoLorenz [phone] PennDamaris SchoonVictorino DikSwazilaHolland [name]<BADTEXTTAG<BADTEXTTPhilbert RisDellia Cl4098178m4401[place]027284696-29Candy SleSiloam Springs Regional HospitaKerin RansAGCO CorporaFontain>09Soma Surgery Ce15nThird Street Surgery CenteVennie H32omaRichardean CanGilda CreaSt. MichaeHenriette [phone] BillinDarleene Cleav747-336-35[name]5K78or<BADTEXTGovernor RoLorenz [phone] PennDamaris SchoonVictorino DikSwazilaHolland [name]<BADTEXTTAG<BADTEXTTPhilbert RisDellia Cl4098133m4401[place]027284696-29Candy SleMarshfield Med Center - Rice LakKerin RansAGCO CorporaFontain>09[facility] For Psychi48aLafayette Physical Rehabilitation HospVennie H77omaRichardean CanGilda CreaBryanHenriette [name] BillinDarleene [name]6K62or<BADTEXTGovernor RoLorenz [phone] PennDamaris SchoonVictorino DikSwazilaHolland [name]<BADTEXTTAG<BADTEXTTPhilbert RisDellia Cl4098132m4401[place]027284696-29Candy SlePam Speciality Hospital Of New BraunfelKerin RansAGCO CorporaFontain>09Simi Surgery Center57 Liberty Eye Surgical CenterVennie H91omaRichardean CanGilda CreaPea RidgHenriette [phone] BillinDarleene Cleav570 590 06[name]3K60or<BADTEXTGovernor RoLorenz [phone] PennDamaris SchoonVictorino DikSwazilaHolland [name]<BADTEXTTAG<BADTEXTTPhilbert RisDellia Cl4098152m4401[place]027284696-29Candy SleCarolina Ambulatory Surgery CenteKerin RansAGCO CorporaFontain>09[place] Hosp34iEastern [facility] Regional [name] CeVennie H81omaRichardean CanGilda CreaRentHenriette [phone] BillinDarleene [name]4K57or<BADTEXTGovernor RoLorenz [phone] PennDamaris SchoonVictorino DikSwazilaHolland [name]<BADTEXTTAG<BADTEXTTPhilbert RisDellia Cl4098146m4401[place]027284696-29Candy SleDavie [name] CenteKerin RansAGCO CorporaFontain>09The [facility] Ce73nWhite Jared Regional [name] CeVennie H48omaRichardean CanGilda CreaFlandreaHenriette [phone] BillinDarleene Cleav309-351-60[name]4K29or<BADTEXTGovernor RoLorenz [phone] PennDamaris SchoonVictorino DikSwazilaHolland [name]<BADTEXTTAG<BADTEXTTPhilbert RisDellia Cl4098127m4401[place]027284696-29Candy SleBaptist Emergency Hospital - Westover HillKerin RansAGCO CorporaFontain>09HiLLCrest Hospital S52oBuffalo Ambulatory Services Inc Dba Buffalo Ambulatory Surgery CeVennie H91omaRichardean CanGilda CreaChamberlaiHenriette [phone] BillinDarleene [name]6K3or<BADTEXTGovernor RoLorenz [phone] PennDamaris SchoonVictorino DikSwazilaHolland [name]<BADTEXTTAG<BADTEXTTPhilbert RisDellia Cl4098163m4401[place]027284696-29Candy SleAmbulatory Surgical Center Of Somerville LLC Dba Somerset Ambulatory Surgical CenteKerin RansAGCO CorporaFontain>09[facility] Ce42nEgnm LLC Dba Lewes Surgery CeVennie H30omaRichardean CanGilda CreaSpicelanHenriette [phone] BillinDarleene Cleav763-585-18[name]5K58or<BADTEXTGovernor RoLorenz [phone] PennDamaris SchoonVictorino DikSwazilaHolland [name]<BADTEXTTAG<BADTEXTTPhilbert RisDellia Cl4098188m4401[place]027284696-29Candy SleShoshone [name] CenteKerin RansAGCO CorporaFontain>09Endoscopy Center At Ridge Plaz61aSt Joseph Health CeVennie H53omaRichardean CanGilda CreaWoodville Farm Labor CamHenriette [phone] BillinDarleene [name]6K59or<BADTEXTGovernor RoLorenz [phone] PennDamaris SchoonVictorino DikSwazilaHolland [name]<BADTEXTTAG<BADTEXTTPhilbert RisDellia Cl4098161m4401[place]027284696-29Candy SlePromise Hospital Of DallaKerin RansAGCO CorporaFontain>09[facility] Surgery Center74 [facility] & MedicalVennie H56omaRichardean CanGilda CreaLake ParHenriette [phone] BillinDarleene Cleav616-688-06[name]3K85or<BADTEXTGovernor RoLorenz [phone] PennDamaris SchoonVictorino DikSwazilaHolland [name]<BADTEXTTAG<BADTEXTTPhilbert RisDellia [id number]

## 2014-02-20 NOTE — Telephone Encounter (Signed)
Pt came in for appt. Carron Curie, CMA

## 2014-02-20 NOTE — Telephone Encounter (Signed)
LMTCBx1. At last OV [facility] increased Dulera to 200 and advised if this works better for the pt then we will send in a Rx. I received an rx refill request for dulera 100. I just want to make sure the pt wants the dulera 100 and not dulera 200. Carron Curie, CMA

## 2014-07-25 ENCOUNTER — Telehealth: Payer: Self-pay | Admitting: Pulmonary Disease

## 2014-07-25 NOTE — Telephone Encounter (Signed)
lmtcb X1 for pt  

## 2014-07-28 MED ORDER — ALBUTEROL SULFATE HFA 108 (90 BASE) MCG/ACT IN AERS: 2.0000 | INHALATION_SPRAY | Freq: Four times a day (QID) | RESPIRATORY_TRACT | Status: DC | PRN

## 2014-07-28 MED ORDER — MOMETASONE FURO-FORMOTEROL FUM 200-5 MCG/ACT IN AERO: 2.0000 | INHALATION_SPRAY | Freq: Two times a day (BID) | RESPIRATORY_TRACT | Status: DC

## 2014-07-28 NOTE — Telephone Encounter (Signed)
Rx's have been sent in. Pt is aware. Nothing further was needed. 

## 2014-08-21 ENCOUNTER — Ambulatory Visit: Payer: BC Managed Care – PPO | Admitting: Pulmonary Disease

## 2014-08-24 ENCOUNTER — Other Ambulatory Visit: Payer: Self-pay | Admitting: Pulmonary Disease

## 2014-08-26 ENCOUNTER — Ambulatory Visit: Payer: Medicare Other | Admitting: Pulmonary Disease

## 2014-10-07 ENCOUNTER — Ambulatory Visit: Payer: Medicare Other | Admitting: Pulmonary Disease

## 2014-10-17 ENCOUNTER — Ambulatory Visit: Payer: Medicare Other | Admitting: Pulmonary Disease

## 2014-11-06 ENCOUNTER — Ambulatory Visit (INDEPENDENT_AMBULATORY_CARE_PROVIDER_SITE_OTHER): Payer: Medicare Other | Admitting: Pulmonary Disease

## 2014-11-06 ENCOUNTER — Encounter: Payer: Self-pay | Admitting: Pulmonary Disease

## 2014-11-06 VITALS — BP 160/62 | HR 87 | Temp 97.8°F | Ht 71.0 in | Wt >= 6400 oz

## 2014-11-06 DIAGNOSIS — G4733 Obstructive sleep apnea (adult) (pediatric): Secondary | ICD-10-CM | POA: Diagnosis not present

## 2014-11-06 DIAGNOSIS — J438 Other emphysema: Secondary | ICD-10-CM

## 2014-11-06 MED ORDER — FLUTICASONE-SALMETEROL 500-50 MCG/DOSE IN AEPB: 1.0000 | INHALATION_SPRAY | Freq: Two times a day (BID) | RESPIRATORY_TRACT | Status: DC

## 2014-11-06 NOTE — Assessment & Plan Note (Signed)
The patient continues to have significant ongoing cough, but he continues to smoke excessively. I have not tried him on tramadol or gabapentin, but I have a significant issue with this if the patient is not even trying to work on total smoking cessation.

## 2014-11-06 NOTE — Assessment & Plan Note (Signed)
The patient has never had documented COPD by PFTs in the past, but his spirometry today shows moderate to severe airflow obstruction. This is not surprising given his ongoing smoking since his last testing. Now that we have documented COPD, I would like to add Spiriva to his regimen as a trial. The patient also has had a change in his insurance formulary, and would like to go back to Advair which has helped him the most in the past. I'm concerned about its impact to his cough, but he feels that it has never been an issue. I have stressed to him again the importance of total smoking cessation.

## 2014-11-06 NOTE — Patient Instructions (Signed)
Will change dulera back to advair 500 given insurance change.  Take one inhalation am and pm Trial of spiriva respimat, 2 inhalations each am.  Call for prescription if you think this really helps.  Work on stopping smoking.  This will help your cough better than anything else I can do. Stay on cpap, and keep up with mask changes and supplies. Work on weight loss followup with Dr. Craige CottaSood in 6mos.

## 2014-11-06 NOTE — Addendum Note (Signed)
Addended by: Charlott HollerKIDD, Kip Kautzman W on: 11/06/2014 12:41 PM   Modules accepted: Orders

## 2014-11-06 NOTE — Assessment & Plan Note (Signed)
The patient continues to do well with his C Pap device, and is having no issues with his mask fit or pressure. I stressed to him the importance of working aggressively on weight loss.

## 2014-11-06 NOTE — Progress Notes (Signed)
   Subjective:    Patient ID: Jared [name], male    DOB: [birth year], 54 y.o.   MRN: [id number]  HPI Patient comes in today for follow-up of his known chronic asthmatic bronchitis and obstructive sleep apnea. He also has a history of upper airway cough, aggravated by his ongoing smoking, along with cough syncope at times. He has been tried on various things for his chronic cough, but I really think it is directly related to his smoking. He has had PFTs in the past which surprisingly showed no airflow obstruction. He is continuing on his C Pap regimen, and is having no issues with his mask fit or pressure. He is continuing to smoke, and feels that his breathing is progressively getting worse. He is also continuing to gained significant weight. He currently has some congestion but minimal purulent mucus.   Review of Systems  Constitutional: Negative for fever and unexpected weight change.  HENT: Positive for congestion and postnasal drip. Negative for dental problem, ear pain, nosebleeds, rhinorrhea, sinus pressure, sneezing, sore throat and trouble swallowing.   Eyes: Negative for redness and itching.  Respiratory: Positive for cough, shortness of breath and wheezing. Negative for chest tightness.   Cardiovascular: Positive for leg swelling. Negative for palpitations.  Gastrointestinal: Negative for nausea and vomiting.  Genitourinary: Negative for dysuria.  Musculoskeletal: Negative for joint swelling.  Skin: Negative for rash.  Neurological: Negative for headaches.  Hematological: Does not bruise/bleed easily.  Psychiatric/Behavioral: Negative for dysphoric mood. The patient is not nervous/anxious.        Objective:   Physical Exam Morbidly obese male in no acute distress Nose without purulence or discharge noted Neck without lymphadenopathy or thyromegaly No skin breakdown or pressure necrosis from the C Pap mask Chest with decreased breath sounds, no active wheezes or  crackles Cardiac exam with regular rate and rhythm Lower extremities with 1+ edema, no cyanosis Alert and oriented, moves all 4 extremities.       Assessment & Plan:

## 2015-01-05 ENCOUNTER — Other Ambulatory Visit: Payer: Self-pay | Admitting: *Deleted

## 2015-01-05 MED ORDER — ALBUTEROL SULFATE HFA 108 (90 BASE) MCG/ACT IN AERS: 2.0000 | INHALATION_SPRAY | Freq: Four times a day (QID) | RESPIRATORY_TRACT | Status: DC | PRN

## 2015-02-25 NOTE — Progress Notes (Signed)
Cardiology Office Note   Date:  02/25/2015   ID:  Jared [name], DOB 07-22-1960, MRN [id number]  PCP:  Josue Hector, MD  Cardiologist:  Elzie Rings, NP   No chief complaint on file.     ERROR Cancelled appointment

## 2015-02-26 ENCOUNTER — Encounter: Payer: Medicare Other | Admitting: Adult Health

## 2015-03-03 ENCOUNTER — Encounter: Payer: Medicare Other | Admitting: Adult Health

## 2015-03-05 ENCOUNTER — Telehealth: Payer: Self-pay | Admitting: Pulmonary Disease

## 2015-03-05 DIAGNOSIS — G4733 Obstructive sleep apnea (adult) (pediatric): Secondary | ICD-10-CM

## 2015-03-05 NOTE — Telephone Encounter (Signed)
Called and spoke to pt. Informed him of the change in DME companies. Order placed. Pt verbalized understanding and denied any further questions or concerns at this time.

## 2015-03-05 NOTE — Telephone Encounter (Signed)
Called spoke with pt. He called [facility] to get supplies and was told they are no longer in network with insurance. He needs to change to [facility] but is needing orders sent to them so they will take over care for his CPAP, etc. Pt has recall in epic for November with VS. Please advise Dr. Craige Cotta if okay to order under you? thanks

## 2015-03-05 NOTE — Telephone Encounter (Signed)
Please send order to have his DME changed to [facility].

## 2015-03-09 ENCOUNTER — Encounter: Payer: Self-pay | Admitting: Adult Health

## 2015-03-09 ENCOUNTER — Ambulatory Visit (INDEPENDENT_AMBULATORY_CARE_PROVIDER_SITE_OTHER): Payer: Medicare Other | Admitting: Adult Health

## 2015-03-09 VITALS — BP 132/66 | HR 93 | Ht 71.0 in | Wt >= 6400 oz

## 2015-03-09 DIAGNOSIS — J410 Simple chronic bronchitis: Secondary | ICD-10-CM

## 2015-03-09 DIAGNOSIS — Z720 Tobacco use: Secondary | ICD-10-CM

## 2015-03-09 DIAGNOSIS — R0789 Other chest pain: Secondary | ICD-10-CM

## 2015-03-09 DIAGNOSIS — I4510 Unspecified right bundle-branch block: Secondary | ICD-10-CM

## 2015-03-09 DIAGNOSIS — F17210 Nicotine dependence, cigarettes, uncomplicated: Secondary | ICD-10-CM | POA: Diagnosis not present

## 2015-03-09 NOTE — Patient Instructions (Signed)
Your physician wants you to follow-up in: 6 MONTHS WITH DR. BRANCH. You will receive a reminder letter in the mail two months in advance. If you don't receive a letter, please call our office to schedule the follow-up appointment.  Your physician recommends that you continue on your current medications as directed. Please refer to the Current Medication list given to you today.  CALL AND LET us KNOW WHAT YOU & YOUR INSURANCE DECIDES ABOUT THE CATH  STOP SMOKING  Thanks for choosing [facility]!!!

## 2015-03-09 NOTE — Progress Notes (Signed)
Cardiology Office Note   Date:  03/09/2015   ID:  [name], DOB [birth year], MRN [id number]  PCP:  Josue Hector, MD  Cardiologist: Elzie Rings, NP   Chief Complaint  Patient presents with  . Chest Pain  . Nicotine Dependence      History of Present Illness: Jared [name] is a morbidly obese 54 y.o. male who presents for ongoing assessment and management of chest pain. Was seen by Dr. Eden Emms in January of 2016 and found to be non-cardiac and associated with thoracic spine. The patient refused stress test.  Other history of morbid obesity, ongoing tobacco abuse, OSA on CPAP, with chronic asthma and bronchitis with associated DOE.  He comes today with continued dyspnea, and occasional chest pressure.  He is followed by [facility] pulmonology.he is concerned that he still has issues that are coming from his heart.  He has a very strong family history of coronary artery disease, he continues to smoke, and has other cardiovascular risk factors to include hypercholesterolemia.  He is refused any testing in the past, including stress test, as he states it increases his heart rate and causes severe chest pressure, but is unable to walk on the treadmill.    Past Medical History  Diagnosis Date  . Tobacco abuse   . Emphysema   . RBBB (right bundle branch block with left anterior fascicular block)   . Syncope   . Anxiety   . Cough syncope     coughing episodes -usually morning  . Back pain   . GERD (gastroesophageal reflux disease)   . COPD (chronic obstructive pulmonary disease)   . High cholesterol   . Asthma   . Complication of anesthesia 01-02-13    [facility]"b/p dropped" after up in room.  Marland Kitchen Hypertension   . OSA (obstructive sleep apnea)     settings 15-uses nightly.  Marland Kitchen History of kidney stones   . Neuromuscular disorder     bulging disc, degenerative disc lumbar/cervical, sciatic nerve pain rt. leg  . Cancer     skin cancer lesions removed form  arms  . Arthritis     degenrative disc disease-lumbar and cervical    Past Surgical History  Procedure Laterality Date  . Surgery for sleep apnea    . Hernia repair      inguinal  . Knee arthroscopy Left 01/03/2013    Procedure: LEFT KNEE ARTHROSCOPY WITH DEBRIDEMENT/MICROFRACTURE/MEDIAL FEMORAL CHONDIAL/PARTIAL MEDIAL AND LATERAL MENISCECTOMY;  Surgeon: Javier Docker, MD;  Location: [facility] ORS;  Service: Orthopedics;  Laterality: Left;     Current Outpatient Prescriptions  Medication Sig Dispense Refill  . albuterol (PROVENTIL HFA;VENTOLIN HFA) 108 (90 BASE) MCG/ACT inhaler Inhale 2 puffs into the lungs every 6 (six) hours as needed for wheezing or shortness of breath. 1 Inhaler 2  . aspirin 81 MG tablet Take 81 mg by mouth daily.    . citalopram (CELEXA) 20 MG tablet Take 20 mg by mouth every morning.     . diclofenac (VOLTAREN) 75 MG EC tablet Take 1 tablet (75 mg total) by mouth 2 (two) times daily. 60 tablet 0  . DULERA 200-5 MCG/ACT AERO INHALE 2 PUFFS INTO THE LUNGS 2 (TWO) TIMES DAILY. 1 Inhaler 3  . esomeprazole (NEXIUM) 40 MG capsule Take 1 capsule (40 mg total) by mouth 2 (two) times daily. 60 capsule 5  . fenofibrate 160 MG tablet Take 1 tablet by mouth as needed.     . fexofenadine (ALLEGRA) 180 MG  tablet Take 180 mg by mouth daily.    . Fluticasone-Salmeterol (ADVAIR DISKUS) 500-50 MCG/DOSE AEPB Inhale 1 puff into the lungs 2 (two) times daily. 60 each 6  . Fluticasone-Salmeterol (ADVAIR DISKUS) 500-50 MCG/DOSE AEPB Inhale 1 puff into the lungs 2 (two) times daily. 1 each 0  . gabapentin (NEURONTIN) 300 MG capsule Take 300-600 mg by mouth 3 (three) times daily. Takes 1 capsule twice daily and 2 at bedtime    . HYDROcodone-acetaminophen (NORCO/VICODIN) 5-325 MG per tablet Take 1 tablet by mouth at bedtime as needed.     . simvastatin (ZOCOR) 40 MG tablet Take 40 mg by mouth as needed.     Marland Kitchen tetrahydrozoline 0.05 % ophthalmic solution Place 1 drop into both eyes daily as needed  (red eyes).     No current facility-administered medications for this visit.    Allergies:   Review of patient's allergies indicates no known allergies.    Social History:  The patient  reports that he has been smoking Cigarettes.  He started smoking about 43 years ago. He has a 60 pack-year smoking history. He has never used smokeless tobacco. He reports that he does not drink alcohol or use illicit drugs.   Family History:  The patient's family history includes Heart disease in his brother, father, mother, and sister.    ROS: All other systems are reviewed and negative. Unless otherwise mentioned in H&P    PHYSICAL EXAM: VS:  BP 132/66 mmHg  Pulse 93  Ht  (1.803 m)  Wt 404 lb (183.253 kg)  BMI 56.37 kg/m2  SpO2 93% , BMI Body mass index is 56.37 kg/(m^2). GEN: Well nourished, well developed, in no acute distress HEENT: normal Neck: no JVD, carotid bruits, or masses Cardiac: RRR; no murmurs, rubs, or gallops,no edema  Respiratory:  clear to auscultation bilaterally, normal work of breathing GI: soft, nontender, nondistended, + BS, morbidly obese MS: no deformity or atrophyuses walker for ambulation Skin: warm and dry, no rash Neuro:  Strength and sensation are intact Psych: euthymic mood, full affect   EKG:  Sinus rhythm, with a right bundle branch block. Heart rate of 92 beats per minute.  Recent Labs: No results found for requested labs within last 365 days.    Lipid Panel    Component Value Date/Time   CHOL 228* 07/08/2012 1659   TRIG 375* 07/08/2012 1659   HDL 32* 07/08/2012 1659   CHOLHDL 7.1 07/08/2012 1659   VLDL 75* 07/08/2012 1659   LDLCALC 121* 07/08/2012 1659      Wt Readings from Last 3 Encounters:  03/09/15 404 lb (183.253 kg)  11/06/14 406 lb 9.6 oz (184.433 kg)  02/20/14 402 lb 6.4 oz (182.527 kg)     ASSESSMENT AND PLAN:  1. Recurrent chest pressure with dyspnea: the patient has concerns that he may have heart disease, which are  contributing to his symptoms, especially with his risk factors and family history.  He refuses a stress test as he is unable to walk on the treadmill and does not want to have a chemically induced increase in his heart rate.  We have discussed other options, but they would be more invasive to include a cardiac catheterization or cardiac CT. He is more inclined to undergo cardiac catheterization though is uncertain that his insurance will pay for this.  He is going to call his insurance and ask if he can be pre-certified for this procedure..  I am not certain that he does need to  undergo cardiac catheterization (both right and left).  However, he continues to have symptoms and is concerned about heart disease.  I discussed at length with him the need to quit smoking become more active and lose weight as this is also contributing to his risk factors.  He verbalizes understanding.  He is not inclined to quit smoking at this time.  I have told him that if he continues to smoke any invasive procedure would not be appropriate as his stents were needed and he continued to smoke.  This would be counterintuitive.    2. Chronic asthmatic bronchitis with COPD: he continues to be followed by pulmonology and his been transferred to Dr.Sood, as his primary pulmonologist is leading to work at the [place], Dr. Shelle Iron.  3. Ongoing tobacco abuse:I have counseled the patient 5-10 minutes concerning tobacco abuse, and need to quit.  Especially at CAD is found.  This would be a large factor in his treatment regimen.   Current medicines are reviewed at length with the patient today.    Labs/ tests ordered today include: None  Orders Placed This Encounter  Procedures  . EKG 12-Lead     Disposition:   FU with 6 months (unless he has cardiac catheterization at which time he will be seen post-hospitalization.)  Signed, Joni Reining, NP  03/09/2015 4:54 PM    [facility] [address],  [place], [place] [address] Phone: [phone]; Fax: [phone]

## 2015-03-09 NOTE — Progress Notes (Deleted)
Name: Jared [name]    DOB: 08-20-1960  Age: 54 y.o.  MR#: [id number]       PCP:  Josue Hector, MD      Insurance: Payor: MEDICARE / Plan: MEDICARE PART A AND B / Product Type: *No Product type* /   CC:   No chief complaint on file.   VS Filed Vitals:   03/09/15 1432  BP: 132/66  Pulse: 93  Height:  (1.803 m)  Weight: 404 lb (183.253 kg)  SpO2: 93%    Weights Current Weight  03/09/15 404 lb (183.253 kg)  11/06/14 406 lb 9.6 oz (184.433 kg)  02/20/14 402 lb 6.4 oz (182.527 kg)    Blood Pressure  BP Readings from Last 3 Encounters:  03/09/15 132/66  11/06/14 160/62  02/20/14 128/78     Admit date:  (Not on file) Last encounter with RMR:  Visit date not found   Allergy Review of patient's allergies indicates no known allergies.  Current Outpatient Prescriptions  Medication Sig Dispense Refill  . albuterol (PROVENTIL HFA;VENTOLIN HFA) 108 (90 BASE) MCG/ACT inhaler Inhale 2 puffs into the lungs every 6 (six) hours as needed for wheezing or shortness of breath. 1 Inhaler 2  . aspirin 81 MG tablet Take 81 mg by mouth daily.    . citalopram (CELEXA) 20 MG tablet Take 20 mg by mouth every morning.     . diclofenac (VOLTAREN) 75 MG EC tablet Take 1 tablet (75 mg total) by mouth 2 (two) times daily. 60 tablet 0  . DULERA 200-5 MCG/ACT AERO INHALE 2 PUFFS INTO THE LUNGS 2 (TWO) TIMES DAILY. 1 Inhaler 3  . esomeprazole (NEXIUM) 40 MG capsule Take 1 capsule (40 mg total) by mouth 2 (two) times daily. 60 capsule 5  . fenofibrate 160 MG tablet Take 1 tablet by mouth as needed.     . fexofenadine (ALLEGRA) 180 MG tablet Take 180 mg by mouth daily.    . Fluticasone-Salmeterol (ADVAIR DISKUS) 500-50 MCG/DOSE AEPB Inhale 1 puff into the lungs 2 (two) times daily. 60 each 6  . Fluticasone-Salmeterol (ADVAIR DISKUS) 500-50 MCG/DOSE AEPB Inhale 1 puff into the lungs 2 (two) times daily. 1 each 0  . gabapentin (NEURONTIN) 300 MG capsule Take 300-600 mg by mouth 3 (three)  times daily. Takes 1 capsule twice daily and 2 at bedtime    . HYDROcodone-acetaminophen (NORCO/VICODIN) 5-325 MG per tablet Take 1 tablet by mouth at bedtime as needed.     . simvastatin (ZOCOR) 40 MG tablet Take 40 mg by mouth as needed.     Marland Kitchen tetrahydrozoline 0.05 % ophthalmic solution Place 1 drop into both eyes daily as needed (red eyes).     No current facility-administered medications for this visit.    Discontinued Meds:    Medications Discontinued During This Encounter  Medication Reason  . cyclobenzaprine (FLEXERIL) 5 MG tablet Error    Patient Active Problem List   Diagnosis Date Noted  . Dislocation, finger, interphalangeal joint 07/08/2013  . Left knee DJD 01/03/2013  . Lateral meniscus tear 01/03/2013  . Back pain 07/08/2012  . Cough 09/03/2010  . Overweight(278.02) 10/20/2009  . TOBACCO ABUSE 10/20/2009  . RBBB 10/20/2009  . SYNCOPE 10/20/2009  . CHEST PAIN 10/20/2009  . Obstructive sleep apnea 05/11/2007  . ASTHMATIC BRONCHITIS, ACUTE 05/11/2007  . COPD with emphysema 05/11/2007  . GERD 05/11/2007    LABS    Component Value Date/Time   NA 141 01/02/2013 1420   NA 138 07/09/2012  0520   NA 139 07/08/2012 0844   K 4.0 01/02/2013 1420   K 4.4 07/09/2012 0520   K 4.2 07/08/2012 0844   CL 102 01/02/2013 1420   CL 103 07/09/2012 0520   CL 103 07/08/2012 0844   CO2 30 01/02/2013 1420   CO2 24 07/09/2012 0520   CO2 25 07/08/2012 0844   GLUCOSE 100* 01/02/2013 1420   GLUCOSE 97 07/09/2012 0520   GLUCOSE 97 07/08/2012 0844   BUN 12 01/02/2013 1420   BUN 19 07/09/2012 0520   BUN 19 07/08/2012 0844   CREATININE 1.08 01/02/2013 1420   CREATININE 0.90 07/09/2012 0520   CREATININE 0.99 07/08/2012 0844   CALCIUM 9.4 01/02/2013 1420   CALCIUM 9.2 07/09/2012 0520   CALCIUM 9.1 07/08/2012 0844   GFRNONAA 77* 01/02/2013 1420   GFRNONAA >90 07/09/2012 0520   GFRNONAA >90 07/08/2012 0844   GFRAA 89* 01/02/2013 1420   GFRAA >90 07/09/2012 0520   GFRAA >90  07/08/2012 0844   CMP     Component Value Date/Time   NA 141 01/02/2013 1420   K 4.0 01/02/2013 1420   CL 102 01/02/2013 1420   CO2 30 01/02/2013 1420   GLUCOSE 100* 01/02/2013 1420   BUN 12 01/02/2013 1420   CREATININE 1.08 01/02/2013 1420   CALCIUM 9.4 01/02/2013 1420   GFRNONAA 77* 01/02/2013 1420   GFRAA 89* 01/02/2013 1420       Component Value Date/Time   WBC 7.6 01/02/2013 1420   WBC 9.1 07/09/2012 0520   WBC 8.3 07/08/2012 0844   HGB 15.2 01/02/2013 1420   HGB 15.6 07/09/2012 0520   HGB 15.3 07/08/2012 0844   HCT 45.9 01/02/2013 1420   HCT 45.3 07/09/2012 0520   HCT 43.6 07/08/2012 0844   MCV 96.8 01/02/2013 1420   MCV 92.8 07/09/2012 0520   MCV 93.4 07/08/2012 0844    Lipid Panel     Component Value Date/Time   CHOL 228* 07/08/2012 1659   TRIG 375* 07/08/2012 1659   HDL 32* 07/08/2012 1659   CHOLHDL 7.1 07/08/2012 1659   VLDL 75* 07/08/2012 1659   LDLCALC 121* 07/08/2012 1659    ABG No results found for: PHART, PCO2ART, PO2ART, HCO3, TCO2, ACIDBASEDEF, O2SAT   Lab Results  Component Value Date   TSH 1.468 07/08/2012   BNP (last 3 results) No results for input(s): BNP in the last 8760 hours.  ProBNP (last 3 results) No results for input(s): PROBNP in the last 8760 hours.  Cardiac Panel (last 3 results) No results for input(s): CKTOTAL, CKMB, TROPONINI, RELINDX in the last 72 hours.  Iron/TIBC/Ferritin/ %Sat No results found for: IRON, TIBC, FERRITIN, IRONPCTSAT   EKG Orders placed or performed in visit on 03/09/15  . EKG 12-Lead     Prior Assessment and Plan Problem List as of 03/09/2015      Cardiovascular and Mediastinum   RBBB   SYNCOPE     Respiratory   Obstructive sleep apnea   Last Assessment & Plan 11/06/2014 Office Visit Written 11/06/2014 10:38 AM by Barbaraann Share, MD    The patient continues to do well with his C Pap device, and is having no issues with his mask fit or pressure. I stressed to him the importance of working  aggressively on weight loss.      ASTHMATIC BRONCHITIS, ACUTE   COPD with emphysema   Last Assessment & Plan 11/06/2014 Office Visit Written 11/06/2014 10:54 AM by Barbaraann Share, MD  The patient has never had documented COPD by PFTs in the past, but his spirometry today shows moderate to severe airflow obstruction. This is not surprising given his ongoing smoking since his last testing. Now that we have documented COPD, I would like to add Spiriva to his regimen as a trial. The patient also has had a change in his insurance formulary, and would like to go back to Advair which has helped him the most in the past. I'm concerned about its impact to his cough, but he feels that it has never been an issue. I have stressed to him again the importance of total smoking cessation.        Digestive   GERD     Musculoskeletal and Integument   Left knee DJD   Lateral meniscus tear   Dislocation, finger, interphalangeal joint     Other   Overweight(278.02)   Last Assessment & Plan 07/12/2012 Office Visit Written 07/12/2012  2:22 PM by Wendall Stade, MD    Discusse low carb diet Activity limited by back pain.        TOBACCO ABUSE   Last Assessment & Plan 07/12/2012 Office Visit Written 07/12/2012  2:21 PM by Wendall Stade, MD    Counseled for less than 10 minutes no motivation to totally quit.  Contnue E-cig      CHEST PAIN   Last Assessment & Plan 07/12/2012 Office Visit Written 07/12/2012  2:22 PM by Wendall Stade, MD    Atypical related to thoracic spine.  Now resolved Patient does not want chemical stress test Symptoms dont warrant cath.  Observe      Cough   Last Assessment & Plan 11/06/2014 Office Visit Written 11/06/2014 10:37 AM by Barbaraann Share, MD    The patient continues to have significant ongoing cough, but he continues to smoke excessively. I have not tried him on tramadol or gabapentin, but I have a significant issue with this if the patient is not even trying to work on total  smoking cessation.      Back pain   Last Assessment & Plan 07/12/2012 Office Visit Written 07/12/2012  2:28 PM by Wendall Stade, MD    F/U Dr Fabiola Backer PT/OT pain meds  Will not do well at this weight          Imaging: No results found.

## 2015-03-13 ENCOUNTER — Inpatient Hospital Stay (HOSPITAL_COMMUNITY)
Admission: AD | Admit: 2015-03-13 | Discharge: 2015-03-17 | DRG: 246 | Disposition: A | Discharge status: Home / Self Care | Payer: Medicare Other | Source: Other Acute Inpatient Hospital | Attending: Cardiovascular Disease | Admitting: Cardiovascular Disease

## 2015-03-13 ENCOUNTER — Encounter (HOSPITAL_COMMUNITY): Payer: Self-pay | Admitting: General Practice

## 2015-03-13 ENCOUNTER — Encounter (HOSPITAL_COMMUNITY)
Admission: AD | Disposition: A | Discharge status: Home / Self Care | Payer: Self-pay | Source: Other Acute Inpatient Hospital | Attending: Cardiovascular Disease

## 2015-03-13 DIAGNOSIS — R05 Cough: Secondary | ICD-10-CM

## 2015-03-13 DIAGNOSIS — I10 Essential (primary) hypertension: Secondary | ICD-10-CM

## 2015-03-13 DIAGNOSIS — Z791 Long term (current) use of non-steroidal anti-inflammatories (NSAID): Secondary | ICD-10-CM

## 2015-03-13 DIAGNOSIS — G4733 Obstructive sleep apnea (adult) (pediatric): Secondary | ICD-10-CM | POA: Diagnosis present

## 2015-03-13 DIAGNOSIS — Z9861 Coronary angioplasty status: Secondary | ICD-10-CM

## 2015-03-13 DIAGNOSIS — R042 Hemoptysis: Secondary | ICD-10-CM | POA: Diagnosis not present

## 2015-03-13 DIAGNOSIS — R739 Hyperglycemia, unspecified: Secondary | ICD-10-CM | POA: Diagnosis present

## 2015-03-13 DIAGNOSIS — F17210 Nicotine dependence, cigarettes, uncomplicated: Secondary | ICD-10-CM | POA: Diagnosis present

## 2015-03-13 DIAGNOSIS — E785 Hyperlipidemia, unspecified: Secondary | ICD-10-CM

## 2015-03-13 DIAGNOSIS — R059 Cough, unspecified: Secondary | ICD-10-CM | POA: Diagnosis present

## 2015-03-13 DIAGNOSIS — Z6841 Body Mass Index (BMI) 40.0 and over, adult: Secondary | ICD-10-CM | POA: Diagnosis not present

## 2015-03-13 DIAGNOSIS — Z8249 Family history of ischemic heart disease and other diseases of the circulatory system: Secondary | ICD-10-CM | POA: Diagnosis not present

## 2015-03-13 DIAGNOSIS — I200 Unstable angina: Secondary | ICD-10-CM | POA: Diagnosis not present

## 2015-03-13 DIAGNOSIS — Z7982 Long term (current) use of aspirin: Secondary | ICD-10-CM | POA: Diagnosis not present

## 2015-03-13 DIAGNOSIS — I5031 Acute diastolic (congestive) heart failure: Secondary | ICD-10-CM

## 2015-03-13 DIAGNOSIS — R079 Chest pain, unspecified: Secondary | ICD-10-CM | POA: Diagnosis present

## 2015-03-13 DIAGNOSIS — Z79899 Other long term (current) drug therapy: Secondary | ICD-10-CM | POA: Diagnosis not present

## 2015-03-13 DIAGNOSIS — M1990 Unspecified osteoarthritis, unspecified site: Secondary | ICD-10-CM | POA: Diagnosis present

## 2015-03-13 DIAGNOSIS — K219 Gastro-esophageal reflux disease without esophagitis: Secondary | ICD-10-CM | POA: Diagnosis present

## 2015-03-13 DIAGNOSIS — J45909 Unspecified asthma, uncomplicated: Secondary | ICD-10-CM | POA: Diagnosis present

## 2015-03-13 DIAGNOSIS — I25110 Atherosclerotic heart disease of native coronary artery with unstable angina pectoris: Secondary | ICD-10-CM

## 2015-03-13 DIAGNOSIS — I214 Non-ST elevation (NSTEMI) myocardial infarction: Secondary | ICD-10-CM | POA: Diagnosis present

## 2015-03-13 DIAGNOSIS — J449 Chronic obstructive pulmonary disease, unspecified: Secondary | ICD-10-CM | POA: Diagnosis present

## 2015-03-13 DIAGNOSIS — I2510 Atherosclerotic heart disease of native coronary artery without angina pectoris: Secondary | ICD-10-CM | POA: Diagnosis not present

## 2015-03-13 DIAGNOSIS — Z955 Presence of coronary angioplasty implant and graft: Secondary | ICD-10-CM

## 2015-03-13 DIAGNOSIS — D649 Anemia, unspecified: Secondary | ICD-10-CM | POA: Diagnosis present

## 2015-03-13 DIAGNOSIS — Z720 Tobacco use: Secondary | ICD-10-CM

## 2015-03-13 DIAGNOSIS — I452 Bifascicular block: Secondary | ICD-10-CM | POA: Diagnosis present

## 2015-03-13 DIAGNOSIS — N179 Acute kidney failure, unspecified: Secondary | ICD-10-CM | POA: Diagnosis not present

## 2015-03-13 DIAGNOSIS — T508X5A Adverse effect of diagnostic agents, initial encounter: Secondary | ICD-10-CM | POA: Diagnosis not present

## 2015-03-13 HISTORY — DX: Atherosclerotic heart disease of native coronary artery without angina pectoris: I25.10

## 2015-03-13 HISTORY — PX: CARDIAC CATHETERIZATION: SHX172

## 2015-03-13 LAB — COMPREHENSIVE METABOLIC PANEL
ALBUMIN: 3.8 g/dL (ref 3.5–5.0)
ALT: 42 U/L (ref 17–63)
ANION GAP: 11 (ref 5–15)
AST: 102 U/L — ABNORMAL HIGH (ref 15–41)
Alkaline Phosphatase: 43 U/L (ref 38–126)
BILIRUBIN TOTAL: 0.5 mg/dL (ref 0.3–1.2)
BUN: 14 mg/dL (ref 6–20)
CO2: 18 mmol/L — ABNORMAL LOW (ref 22–32)
Calcium: 8.9 mg/dL (ref 8.9–10.3)
Chloride: 107 mmol/L (ref 101–111)
Creatinine, Ser: 1.58 mg/dL — ABNORMAL HIGH (ref 0.61–1.24)
GFR, EST AFRICAN AMERICAN: 55 mL/min — AB (ref >60)
GFR, EST NON AFRICAN AMERICAN: 48 mL/min — AB (ref >60)
GLUCOSE: 147 mg/dL — AB (ref 65–99)
POTASSIUM: 5 mmol/L (ref 3.5–5.1)
Sodium: 136 mmol/L (ref 135–145)
TOTAL PROTEIN: 7.5 g/dL (ref 6.5–8.1)

## 2015-03-13 LAB — PROTIME-INR
INR: 1.23 (ref 0.00–1.49)
PROTHROMBIN TIME: 15.7 s — AB (ref 11.6–15.2)

## 2015-03-13 LAB — CBC
HEMATOCRIT: 42.6 % (ref 39.0–52.0)
Hemoglobin: 14.5 g/dL (ref 13.0–17.0)
MCH: 32 pg (ref 26.0–34.0)
MCHC: 34 g/dL (ref 30.0–36.0)
MCV: 94 fL (ref 78.0–100.0)
Platelets: 256 10*3/uL (ref 150–400)
RBC: 4.53 MIL/uL (ref 4.22–5.81)
RDW: 14.6 % (ref 11.5–15.5)
WBC: 11.6 10*3/uL — ABNORMAL HIGH (ref 4.0–10.5)

## 2015-03-13 LAB — MRSA PCR SCREENING: MRSA BY PCR: NEGATIVE

## 2015-03-13 SURGERY — LEFT HEART CATH AND CORONARY ANGIOGRAPHY
Anesthesia: LOCAL

## 2015-03-13 MED ORDER — SODIUM CHLORIDE 0.9 % IV SOLN: 250.0000 mL | INTRAVENOUS | Status: DC | PRN

## 2015-03-13 MED ORDER — ASPIRIN 300 MG RE SUPP: 300.0000 mg | RECTAL | Status: AC

## 2015-03-13 MED ORDER — PANTOPRAZOLE SODIUM 40 MG PO TBEC
40.0000 mg | DELAYED_RELEASE_TABLET | Freq: Every day | ORAL | Status: DC
  Administered 2015-03-14 – 2015-03-17 (×4): 40 mg via ORAL
  Filled 2015-03-13 (×4): qty 1

## 2015-03-13 MED ORDER — ASPIRIN 81 MG PO TABS: 81.0000 mg | ORAL_TABLET | Freq: Every day | ORAL | Status: DC

## 2015-03-13 MED ORDER — FENTANYL CITRATE (PF) 100 MCG/2ML IJ SOLN
INTRAMUSCULAR | Status: DC | PRN
  Administered 2015-03-13: 25 ug via INTRAVENOUS
  Administered 2015-03-13: 50 ug via INTRAVENOUS

## 2015-03-13 MED ORDER — TIROFIBAN HCL IV 12.5 MG/250 ML
INTRAVENOUS | Status: AC
  Filled 2015-03-13: qty 250

## 2015-03-13 MED ORDER — NITROGLYCERIN 0.4 MG SL SUBL: 0.4000 mg | SUBLINGUAL_TABLET | SUBLINGUAL | Status: DC | PRN

## 2015-03-13 MED ORDER — TICAGRELOR 90 MG PO TABS
ORAL_TABLET | ORAL | Status: AC
  Filled 2015-03-13: qty 1

## 2015-03-13 MED ORDER — SIMVASTATIN 20 MG PO TABS: 40.0000 mg | ORAL_TABLET | Freq: Every day | ORAL | Status: DC

## 2015-03-13 MED ORDER — TICAGRELOR 90 MG PO TABS
90.0000 mg | ORAL_TABLET | Freq: Two times a day (BID) | ORAL | Status: DC
  Administered 2015-03-14 – 2015-03-17 (×7): 90 mg via ORAL
  Filled 2015-03-13 (×7): qty 1

## 2015-03-13 MED ORDER — CETYLPYRIDINIUM CHLORIDE 0.05 % MT LIQD
7.0000 mL | Freq: Two times a day (BID) | OROMUCOSAL | Status: DC
  Administered 2015-03-13 – 2015-03-16 (×6): 7 mL via OROMUCOSAL

## 2015-03-13 MED ORDER — ALBUTEROL SULFATE (2.5 MG/3ML) 0.083% IN NEBU
2.5000 mg | INHALATION_SOLUTION | Freq: Four times a day (QID) | RESPIRATORY_TRACT | Status: DC | PRN
  Administered 2015-03-13 – 2015-03-16 (×5): 2.5 mg via RESPIRATORY_TRACT
  Filled 2015-03-13 (×6): qty 3

## 2015-03-13 MED ORDER — SODIUM CHLORIDE 0.9 % IJ SOLN
3.0000 mL | Freq: Two times a day (BID) | INTRAMUSCULAR | Status: DC
  Administered 2015-03-13 – 2015-03-16 (×4): 3 mL via INTRAVENOUS

## 2015-03-13 MED ORDER — MORPHINE SULFATE (PF) 2 MG/ML IV SOLN
1.0000 mg | INTRAVENOUS | Status: DC | PRN
  Administered 2015-03-15: 2 mg via INTRAVENOUS
  Administered 2015-03-15: 1 mg via INTRAVENOUS
  Filled 2015-03-13 (×2): qty 1

## 2015-03-13 MED ORDER — ONDANSETRON HCL 4 MG/2ML IJ SOLN: 4.0000 mg | Freq: Four times a day (QID) | INTRAMUSCULAR | Status: DC | PRN

## 2015-03-13 MED ORDER — ADENOSINE (DIAGNOSTIC) FOR INTRACORONARY USE
INTRAVENOUS | Status: DC | PRN
  Administered 2015-03-13 (×3): 60 ug via INTRACORONARY

## 2015-03-13 MED ORDER — ASPIRIN EC 81 MG PO TBEC
81.0000 mg | DELAYED_RELEASE_TABLET | Freq: Every day | ORAL | Status: DC
Start: 2015-03-14 — End: 2015-03-17
  Administered 2015-03-14 – 2015-03-17 (×4): 81 mg via ORAL
  Filled 2015-03-13 (×4): qty 1

## 2015-03-13 MED ORDER — TIROFIBAN HCL IV 5 MG/100ML
INTRAVENOUS | Status: DC | PRN
  Administered 2015-03-13: 22.92 ug/min via INTRAVENOUS

## 2015-03-13 MED ORDER — FENTANYL CITRATE (PF) 100 MCG/2ML IJ SOLN
INTRAMUSCULAR | Status: AC
  Filled 2015-03-13: qty 4

## 2015-03-13 MED ORDER — MOMETASONE FURO-FORMOTEROL FUM 200-5 MCG/ACT IN AERO
2.0000 | INHALATION_SPRAY | Freq: Two times a day (BID) | RESPIRATORY_TRACT | Status: DC
  Administered 2015-03-13 – 2015-03-16 (×6): 2 via RESPIRATORY_TRACT
  Filled 2015-03-13: qty 8.8

## 2015-03-13 MED ORDER — VERAPAMIL HCL 2.5 MG/ML IV SOLN
INTRAVENOUS | Status: AC
  Filled 2015-03-13: qty 2

## 2015-03-13 MED ORDER — TICAGRELOR 90 MG PO TABS
ORAL_TABLET | ORAL | Status: DC | PRN
  Administered 2015-03-13: 180 mg via ORAL

## 2015-03-13 MED ORDER — SODIUM CHLORIDE 0.9 % IJ SOLN
3.0000 mL | Freq: Two times a day (BID) | INTRAMUSCULAR | Status: DC
  Administered 2015-03-13: 3 mL via INTRAVENOUS

## 2015-03-13 MED ORDER — HEPARIN SODIUM (PORCINE) 5000 UNIT/ML IJ SOLN
5000.0000 [IU] | Freq: Three times a day (TID) | INTRAMUSCULAR | Status: DC
  Administered 2015-03-14 – 2015-03-16 (×7): 5000 [IU] via SUBCUTANEOUS
  Filled 2015-03-13 (×6): qty 1

## 2015-03-13 MED ORDER — VERAPAMIL HCL 2.5 MG/ML IV SOLN
INTRAVENOUS | Status: DC | PRN
  Administered 2015-03-13: 18:00:00 via INTRA_ARTERIAL

## 2015-03-13 MED ORDER — HEPARIN SODIUM (PORCINE) 1000 UNIT/ML IJ SOLN
INTRAMUSCULAR | Status: AC
  Filled 2015-03-13: qty 1

## 2015-03-13 MED ORDER — TIROFIBAN HCL IV 5 MG/100ML
22.9200 ug/min | INTRAVENOUS | Status: AC
  Administered 2015-03-13 – 2015-03-14 (×5): 22.92 ug/min via INTRAVENOUS
  Filled 2015-03-13 (×4): qty 100

## 2015-03-13 MED ORDER — HEPARIN (PORCINE) IN NACL 2-0.9 UNIT/ML-% IJ SOLN
INTRAMUSCULAR | Status: AC
  Filled 2015-03-13: qty 1000

## 2015-03-13 MED ORDER — ACETAMINOPHEN 325 MG PO TABS
650.0000 mg | ORAL_TABLET | ORAL | Status: DC | PRN
  Administered 2015-03-14 – 2015-03-16 (×3): 650 mg via ORAL
  Filled 2015-03-13 (×3): qty 2

## 2015-03-13 MED ORDER — ACETAMINOPHEN 325 MG PO TABS: 650.0000 mg | ORAL_TABLET | ORAL | Status: DC | PRN

## 2015-03-13 MED ORDER — SODIUM CHLORIDE 0.9 % IJ SOLN
3.0000 mL | Freq: Two times a day (BID) | INTRAMUSCULAR | Status: DC
Start: 2015-03-14 — End: 2015-03-17
  Administered 2015-03-14 – 2015-03-16 (×3): 3 mL via INTRAVENOUS

## 2015-03-13 MED ORDER — LIDOCAINE HCL (PF) 1 % IJ SOLN
INTRAMUSCULAR | Status: AC
  Filled 2015-03-13: qty 30

## 2015-03-13 MED ORDER — VERAPAMIL HCL 2.5 MG/ML IV SOLN
INTRAVENOUS | Status: DC | PRN
  Administered 2015-03-13 (×3): 200 ug via INTRACORONARY

## 2015-03-13 MED ORDER — IOHEXOL 350 MG/ML SOLN
INTRAVENOUS | Status: DC | PRN
  Administered 2015-03-13: 250 mL via INTRA_ARTERIAL

## 2015-03-13 MED ORDER — SODIUM CHLORIDE 0.9 % IJ SOLN: 3.0000 mL | INTRAMUSCULAR | Status: DC | PRN

## 2015-03-13 MED ORDER — FUROSEMIDE 10 MG/ML IJ SOLN
INTRAMUSCULAR | Status: AC
  Filled 2015-03-13: qty 4

## 2015-03-13 MED ORDER — NITROGLYCERIN IN D5W 200-5 MCG/ML-% IV SOLN
INTRAVENOUS | Status: DC | PRN
  Administered 2015-03-13: 10 ug/min via INTRAVENOUS

## 2015-03-13 MED ORDER — ASPIRIN 81 MG PO CHEW
324.0000 mg | CHEWABLE_TABLET | ORAL | Status: AC
  Administered 2015-03-13: 324 mg via ORAL
  Filled 2015-03-13: qty 4

## 2015-03-13 MED ORDER — CITALOPRAM HYDROBROMIDE 20 MG PO TABS
20.0000 mg | ORAL_TABLET | Freq: Every morning | ORAL | Status: DC
  Administered 2015-03-14 – 2015-03-17 (×4): 20 mg via ORAL
  Filled 2015-03-13 (×4): qty 1

## 2015-03-13 MED ORDER — FENOFIBRATE 160 MG PO TABS
160.0000 mg | ORAL_TABLET | Freq: Every day | ORAL | Status: DC
  Administered 2015-03-14 – 2015-03-17 (×4): 160 mg via ORAL
  Filled 2015-03-13 (×4): qty 1

## 2015-03-13 MED ORDER — HEPARIN SODIUM (PORCINE) 1000 UNIT/ML IJ SOLN
INTRAMUSCULAR | Status: DC | PRN
  Administered 2015-03-13: 2000 [IU] via INTRAVENOUS
  Administered 2015-03-13: 6000 [IU] via INTRAVENOUS
  Administered 2015-03-13: 2000 [IU] via INTRAVENOUS
  Administered 2015-03-13: 5000 [IU] via INTRAVENOUS

## 2015-03-13 MED ORDER — TICAGRELOR 90 MG PO TABS: ORAL_TABLET | ORAL | Status: DC | PRN

## 2015-03-13 MED ORDER — GABAPENTIN 300 MG PO CAPS
300.0000 mg | ORAL_CAPSULE | Freq: Three times a day (TID) | ORAL | Status: DC
  Administered 2015-03-13 – 2015-03-17 (×12): 300 mg via ORAL
  Filled 2015-03-13 (×11): qty 1

## 2015-03-13 MED ORDER — FUROSEMIDE 10 MG/ML IJ SOLN
INTRAMUSCULAR | Status: DC | PRN
Start: 2015-03-13 — End: 2015-03-13
  Administered 2015-03-13: 40 mg via INTRAVENOUS

## 2015-03-13 MED ORDER — ASPIRIN 81 MG PO CHEW: 81.0000 mg | CHEWABLE_TABLET | Freq: Every day | ORAL | Status: DC

## 2015-03-13 MED ORDER — SODIUM CHLORIDE 0.9 % IV SOLN
INTRAVENOUS | Status: DC
  Administered 2015-03-13: 15:00:00 via INTRAVENOUS

## 2015-03-13 MED ORDER — ASPIRIN 81 MG PO CHEW: 81.0000 mg | CHEWABLE_TABLET | ORAL | Status: DC

## 2015-03-13 MED ORDER — TIROFIBAN (AGGRASTAT) BOLUS VIA INFUSION
INTRAVENOUS | Status: DC | PRN
  Administered 2015-03-13: 3825 ug via INTRAVENOUS

## 2015-03-13 MED ORDER — ADENOSINE 6 MG/2ML IV SOLN
INTRAVENOUS | Status: AC
  Filled 2015-03-13: qty 2

## 2015-03-13 SURGICAL SUPPLY — 25 items
BALLN EMERGE MR 2.5X12 (BALLOONS) ×2
BALLN ~~LOC~~ EUPHORA RX 4.0X12 (BALLOONS) ×2
BALLOON EMERGE MR 2.5X12 (BALLOONS) IMPLANT
BALLOON ~~LOC~~ EUPHORA RX 4.0X12 (BALLOONS) IMPLANT
CATH EXTRAC PRONTO LP 6F RND (CATHETERS) ×1 IMPLANT
CATH INFINITI 5 FR 3DRC (CATHETERS) ×1 IMPLANT
CATH INFINITI 5 FR AR2 MOD (CATHETERS) ×1 IMPLANT
CATH INFINITI 5 FR JL3.5 (CATHETERS) ×2 IMPLANT
CATH INFINITI 5FR ANG PIGTAIL (CATHETERS) ×2 IMPLANT
CATH INFINITI JR4 5F (CATHETERS) ×2 IMPLANT
CATH SITESEER 5F NTR (CATHETERS) ×1 IMPLANT
DEVICE RAD COMP TR BAND LRG (VASCULAR PRODUCTS) ×2 IMPLANT
GLIDESHEATH SLEND SS 6F .021 (SHEATH) ×2 IMPLANT
GUIDE CATH RUNWAY 6FR CLS3 (CATHETERS) ×1 IMPLANT
HOVERMATT SINGLE USE (MISCELLANEOUS) ×1 IMPLANT
KIT ENCORE 26 ADVANTAGE (KITS) ×1 IMPLANT
KIT HEART LEFT (KITS) ×2 IMPLANT
PACK CARDIAC CATHETERIZATION (CUSTOM PROCEDURE TRAY) ×2 IMPLANT
STENT SYNERGY DES 3.5X16 (Permanent Stent) ×1 IMPLANT
SYR MEDRAD MARK V 150ML (SYRINGE) ×2 IMPLANT
TRANSDUCER W/STOPCOCK (MISCELLANEOUS) ×2 IMPLANT
TUBING CIL FLEX 10 FLL-RA (TUBING) ×2 IMPLANT
VALVE GUARDIAN II ~~LOC~~ HEMO (MISCELLANEOUS) ×2 IMPLANT
WIRE ASAHI PROWATER 180CM (WIRE) ×1 IMPLANT
WIRE SAFE-T 1.5MM-J .035X260CM (WIRE) ×2 IMPLANT

## 2015-03-13 NOTE — Interval H&P Note (Signed)
History and Physical Interval Note:  03/13/2015 6:03 PM  Jared [name]  has presented today for surgery, with the diagnosis of NSTEMI  The various methods of treatment have been discussed with the patient and family. After consideration of risks, benefits and other options for treatment, the patient has consented to  Procedure(s): Left Heart Cath and Coronary Angiography (N/A) possible angioplasty as a surgical intervention .  The patient's history has been reviewed, patient examined, no change in status, stable for surgery.  I have reviewed the patient's chart and labs.  Questions were answered to the patient's satisfaction.     Bensimhon, [name]

## 2015-03-13 NOTE — H&P (Signed)
Patient ID: Jared [name] MRN: [id number], DOB/AGE: [birth year]   Admit date: 03/13/2015   Primary Physician: Josue Hector, MD Primary Cardiologist: Dr. Eden Emms   Pt. Profile:  54 y/o male with multiple cardiac risk factors including morbid obesity, tobacco abuse, HTN, HLD and family history, admitted for  chest pain evaluation.   Problem List  Past Medical History  Diagnosis Date  . Tobacco abuse   . Emphysema   . RBBB (right bundle branch block with left anterior fascicular block)   . Syncope   . Anxiety   . Cough syncope     coughing episodes -usually morning  . Back pain   . GERD (gastroesophageal reflux disease)   . COPD (chronic obstructive pulmonary disease)   . High cholesterol   . Asthma   . Complication of anesthesia 01-02-13    [facility]"b/p dropped" after up in room.  Marland Kitchen Hypertension   . OSA (obstructive sleep apnea)     settings 15-uses nightly.  Marland Kitchen History of kidney stones   . Neuromuscular disorder     bulging disc, degenerative disc lumbar/cervical, sciatic nerve pain rt. leg  . Cancer     skin cancer lesions removed form arms  . Arthritis     degenrative disc disease-lumbar and cervical    Past Surgical History  Procedure Laterality Date  . Surgery for sleep apnea    . Hernia repair      inguinal  . Knee arthroscopy Left 01/03/2013    Procedure: LEFT KNEE ARTHROSCOPY WITH DEBRIDEMENT/MICROFRACTURE/MEDIAL FEMORAL CHONDIAL/PARTIAL MEDIAL AND LATERAL MENISCECTOMY;  Surgeon: Javier Docker, MD;  Location: [facility];  Service: Orthopedics;  Laterality: Left;     Allergies  No Known Allergies  HPI  54 y/o morbidly obese male with no known h/o CAD but with multiple risk factors including tobacco abuse, obesity, HTN, HLD and family h/o CAD, who was transferred from [facility] to [facility] for evaluation/management of chest pain.   He was initially seen by Dr. Eden Emms in January 2016 for chest pain eval. Per records, he refused to  undergo stress testing at that time, but it was also mentioned that his symptoms were, at that time, felt to be noncardiac and associated with his thoracic spine.  He recently presented back to our office in [place], on Monday 03/09/15, and was seen by Joni Reining, NP. He noted continued CP and dyspnea. The option of stress testing was readdressed, however he continued to refuse. The possibility of undergoing a LHC was also entertained, however the patient voiced that he wanted to check with his insurance company for pre authorization, prior to scheduling an elective procedure. It should be noted that his brother had CABG in his 69s.   He presented to [facility] in the early morning hours by [facility] with a 2 hr h/o dull substernal chest pain radiating to his mid back and worse with deep inspiration. Symptoms started roughly at 11:30 PM. His last meal prior to onset of pain was at 8:00 PM (fried seafood platter). He was at his bedside getting ready to go to bed when symptoms started. He also noted radiation to both shoulder blades and down both arms. He had associated nausea w/o vomiting, diaphoresis and dyspnea. Symptoms were worse in the supine position and alleviated sitting up. Also pleuritic. He tried 4 baby ASA and SL NTG x 2 w/o relief. After 1.5 hrs of ongoing symptoms, his wife called EMS. He was given additional SL NTG by  EMS w/o relief.   On arrival to [facility], EKG showed no acute abnormalities. Labs were notable for an abnormal troponin of 0.04. Repeat troponin had increased to 0.38. D-dimer was negative. Pro-BNP was WNL at 45.73. CBC shows leukocytosis of 12.1. SCr is 1.39. He was placed on IV heparin and transferred to [facility].                                                                                                                 On arrival to [facility], his EKG shows a chronic RBBB. He is chest pain free.    Home Medications  Prior to Admission medications     Medication Sig Start Date End Date Taking? Authorizing Provider  albuterol (PROVENTIL HFA;VENTOLIN HFA) 108 (90 BASE) MCG/ACT inhaler Inhale 2 puffs into the lungs every 6 (six) hours as needed for wheezing or shortness of breath. 01/05/15   Coralyn Helling, MD  aspirin 81 MG tablet Take 81 mg by mouth daily. 01/03/13   Jene Every, MD  citalopram (CELEXA) 20 MG tablet Take 20 mg by mouth every morning.     Historical Provider, MD  diclofenac (VOLTAREN) 75 MG EC tablet Take 1 tablet (75 mg total) by mouth 2 (two) times daily. 05/26/12   Susy Frizzle, MD  DULERA 200-5 MCG/ACT AERO INHALE 2 PUFFS INTO THE LUNGS 2 (TWO) TIMES DAILY. 08/25/14   Barbaraann Share, MD  esomeprazole (NEXIUM) 40 MG capsule Take 1 capsule (40 mg total) by mouth 2 (two) times daily. 07/26/13   Barbaraann Share, MD  fenofibrate 160 MG tablet Take 1 tablet by mouth as needed.  10/27/12   Historical Provider, MD  fexofenadine (ALLEGRA) 180 MG tablet Take 180 mg by mouth daily.    Historical Provider, MD  Fluticasone-Salmeterol (ADVAIR DISKUS) 500-50 MCG/DOSE AEPB Inhale 1 puff into the lungs 2 (two) times daily. 11/06/14   Barbaraann Share, MD  Fluticasone-Salmeterol (ADVAIR DISKUS) 500-50 MCG/DOSE AEPB Inhale 1 puff into the lungs 2 (two) times daily. 11/06/14   Barbaraann Share, MD  gabapentin (NEURONTIN) 300 MG capsule Take 300-600 mg by mouth 3 (three) times daily. Takes 1 capsule twice daily and 2 at bedtime    Historical Provider, MD  HYDROcodone-acetaminophen (NORCO/VICODIN) 5-325 MG per tablet Take 1 tablet by mouth at bedtime as needed.  03/02/15 03/01/16  Historical Provider, MD  simvastatin (ZOCOR) 40 MG tablet Take 40 mg by mouth as needed.  03/30/11   Historical Provider, MD  tetrahydrozoline 0.05 % ophthalmic solution Place 1 drop into both eyes daily as needed (red eyes).    Historical Provider, MD    Family History  Family History  Problem Relation Age of Onset  . Heart disease Mother   . Heart disease Father   . Heart  disease Sister   . Heart disease Brother     Social History  Social History   Social History  . Marital Status: Married    Spouse Name: N/A  . Number of Children: N/A  . Years  of Education: N/A   Occupational History  . Not on file.   Social History Main Topics  . Smoking status: Current Every Day Smoker -- 1.50 packs/day for 40 years    Types: Cigarettes    Start date: 03/08/1972  . Smokeless tobacco: Never Used     Comment: started smoking at age 45.    Marland Kitchen Alcohol Use: No  . Drug Use: No  . Sexual Activity: Yes   Other Topics Concern  . Not on file   Social History Narrative     Review of Systems General:  No chills, fever, night sweats or weight changes.  Cardiovascular:  No chest pain, dyspnea on exertion, edema, orthopnea, palpitations, paroxysmal nocturnal dyspnea. Dermatological: No rash, lesions/masses Respiratory: No cough, dyspnea Urologic: No hematuria, dysuria Abdominal:   No nausea, vomiting, diarrhea, bright red blood per rectum, melena, or hematemesis Neurologic:  No visual changes, wkns, changes in mental status. All other systems reviewed and are otherwise negative except as noted above.  Physical Exam  Blood pressure 129/49, pulse 94, temperature 98.1 F (36.7 C), temperature source Oral, resp. rate 16, SpO2 95 %.  General: Pleasant, NAD, morbidly obese Psych: Normal affect. Neuro: Alert and oriented X 3. Moves all extremities spontaneously. HEENT: Normal  Neck: Supple without bruits or JVD. Lungs:  Resp regular and unlabored, distant BS due to body habitus Heart: RRR, distant heart sounds due to body habitus Abdomen: Obese Soft, non-tender, non-distended, BS + x 4.  Extremities: No clubbing, cyanosis or edema. DP/PT/Radials 2+ and equal bilaterally.  Labs  Troponin (Point of Care Test) No results for input(s): TROPIPOC in the last 72 hours. No results for input(s): CKTOTAL, CKMB, TROPONINI in the last 72 hours. Lab Results  Component  Value Date   WBC 7.6 01/02/2013   HGB 15.2 01/02/2013   HCT 45.9 01/02/2013   MCV 96.8 01/02/2013   PLT 256 01/02/2013   No results for input(s): NA, K, CL, CO2, BUN, CREATININE, CALCIUM, PROT, BILITOT, ALKPHOS, ALT, AST, GLUCOSE in the last 168 hours.  Invalid input(s): LABALBU Lab Results  Component Value Date   CHOL 228* 07/08/2012   HDL 32* 07/08/2012   LDLCALC 121* 07/08/2012   TRIG 375* 07/08/2012   Lab Results  Component Value Date   DDIMER <0.27 07/08/2012     Radiology/Studies  No results found.  ECG  NSR with Chronic RBBB  ASSESSMENT AND PLAN  1. Chest Pain: symptoms are concerning for possible unstable angina, but also c/w reflux. His enzymes at [facility], however, were abnormal, up to 0.38. Given his multiple cardiac risk factors, will plan for definitive LHC to rule out CAD.   2. HTN: BP is currently well controlled.   3. HLD: check FLP in the am. Continue statin.   4. Borderline DM: patient reports h/o borderline DM. Will check a Hgb A1c.   5. Obesity: he is 404 lb. Will need to stress weight loss.  6. Tobacco Abuse: smoking cessation strongly advised.   7. OSA: Compliant with CPAP  Signed, Robbie Lis, PA-C 03/13/2015, 1:51 PM   Patient seen and examined with Robbie Lis, PA-C. We discussed all aspects of the encounter. I agree with the assessment and plan as stated above. CP and elevated troponins suggestive of ACS but agree that GERD may also be playing a role as symptoms worse with lying down and he has recently stopped his Nexium. Will plan cath today to evaluate coronary anatomy. Need for smoking cessation and weight loss.  Bensimhon, Daniel,MD 2:58 PM

## 2015-03-14 ENCOUNTER — Inpatient Hospital Stay (HOSPITAL_COMMUNITY): Payer: Medicare Other

## 2015-03-14 DIAGNOSIS — I2510 Atherosclerotic heart disease of native coronary artery without angina pectoris: Secondary | ICD-10-CM

## 2015-03-14 LAB — LIPID PANEL
Cholesterol: 284 mg/dL — ABNORMAL HIGH (ref 0–200)
HDL: 28 mg/dL — AB (ref >40)
LDL Cholesterol: 192 mg/dL — ABNORMAL HIGH (ref 0–99)
Total CHOL/HDL Ratio: 10.1 RATIO
Triglycerides: 320 mg/dL — ABNORMAL HIGH (ref <150)
VLDL: 64 mg/dL — ABNORMAL HIGH (ref 0–40)

## 2015-03-14 LAB — CBC
HCT: 44.8 % (ref 39.0–52.0)
HEMOGLOBIN: 15 g/dL (ref 13.0–17.0)
MCH: 31.5 pg (ref 26.0–34.0)
MCHC: 33.5 g/dL (ref 30.0–36.0)
MCV: 94.1 fL (ref 78.0–100.0)
Platelets: 242 10*3/uL (ref 150–400)
RBC: 4.76 MIL/uL (ref 4.22–5.81)
RDW: 14.7 % (ref 11.5–15.5)
WBC: 16.9 10*3/uL — AB (ref 4.0–10.5)

## 2015-03-14 LAB — BASIC METABOLIC PANEL
ANION GAP: 13 (ref 5–15)
BUN: 17 mg/dL (ref 6–20)
CALCIUM: 9.1 mg/dL (ref 8.9–10.3)
CO2: 20 mmol/L — ABNORMAL LOW (ref 22–32)
Chloride: 102 mmol/L (ref 101–111)
Creatinine, Ser: 1.48 mg/dL — ABNORMAL HIGH (ref 0.61–1.24)
GFR calc Af Amer: 60 mL/min — ABNORMAL LOW (ref >60)
GFR, EST NON AFRICAN AMERICAN: 52 mL/min — AB (ref >60)
GLUCOSE: 109 mg/dL — AB (ref 65–99)
POTASSIUM: 4.4 mmol/L (ref 3.5–5.1)
SODIUM: 135 mmol/L (ref 135–145)

## 2015-03-14 MED ORDER — NICOTINE 21 MG/24HR TD PT24
21.0000 mg | MEDICATED_PATCH | Freq: Every day | TRANSDERMAL | Status: DC
  Administered 2015-03-14 – 2015-03-17 (×4): 21 mg via TRANSDERMAL
  Filled 2015-03-14 (×5): qty 1

## 2015-03-14 MED ORDER — ATORVASTATIN CALCIUM 80 MG PO TABS
80.0000 mg | ORAL_TABLET | Freq: Every day | ORAL | Status: DC
  Administered 2015-03-14 – 2015-03-16 (×3): 80 mg via ORAL
  Filled 2015-03-14 (×3): qty 1

## 2015-03-14 MED ORDER — RAMIPRIL 2.5 MG PO CAPS
2.5000 mg | ORAL_CAPSULE | Freq: Every day | ORAL | Status: DC
  Administered 2015-03-14 – 2015-03-15 (×2): 2.5 mg via ORAL
  Filled 2015-03-14 (×3): qty 1

## 2015-03-14 MED ORDER — CARVEDILOL 3.125 MG PO TABS
3.1250 mg | ORAL_TABLET | Freq: Two times a day (BID) | ORAL | Status: DC
  Administered 2015-03-14 – 2015-03-15 (×4): 3.125 mg via ORAL
  Filled 2015-03-14 (×4): qty 1

## 2015-03-14 MED ORDER — PERFLUTREN LIPID MICROSPHERE
1.0000 mL | INTRAVENOUS | Status: AC | PRN
  Administered 2015-03-14: 3 mL via INTRAVENOUS
  Filled 2015-03-14: qty 10

## 2015-03-14 NOTE — Progress Notes (Addendum)
Patient ID: Jared [name], male   DOB: 12/01/1960, 54 y.o.   MRN: [id number]    Patient Name: Jared [name] Date of Encounter: 03/14/2015     Active Problems:   Unstable angina   NSTEMI (non-ST elevated myocardial infarction)    SUBJECTIVE  No chest pain. Dyspnea improving.   CURRENT MEDS . antiseptic oral rinse  7 mL Mouth Rinse BID  . aspirin  81 mg Oral Pre-Cath  . aspirin EC  81 mg Oral Daily  . citalopram  20 mg Oral q morning - 10a  . fenofibrate  160 mg Oral Daily  . gabapentin  300 mg Oral TID  . heparin  5,000 Units Subcutaneous 3 times per day  . mometasone-formoterol  2 puff Inhalation BID  . pantoprazole  40 mg Oral Daily  . simvastatin  40 mg Oral q1800  . sodium chloride  3 mL Intravenous Q12H  . sodium chloride  3 mL Intravenous Q12H  . sodium chloride  3 mL Intravenous Q12H  . ticagrelor  90 mg Oral BID    OBJECTIVE  Filed Vitals:   03/14/15 0630 03/14/15 0631 03/14/15 0700 03/14/15 0822  BP: 142/84  150/91   Pulse: 91 90 87   Temp:   98 F (36.7 C)   TempSrc:   Oral   Resp: Height:      Weight:      SpO2: 95% 94% 95% 97%    Intake/Output Summary (Last 24 hours) at 03/14/15 0832 Last data filed at 03/14/15 0700  Gross per 24 hour  Intake 1562.03 ml  Output   2005 ml  Net -442.97 ml   Filed Weights   03/13/15 1334  Weight: 404 lb (183.253 kg)    PHYSICAL EXAM  General: Pleasant, obese NAD. Neuro: Alert and oriented X 3. Moves all extremities spontaneously. Psych: Normal affect. HEENT:  Normal  Neck: Supple without bruits, 7 cm JVD. Lungs:  Resp regular and unlabored, CTA. Heart: RRR no s3, s4, or murmurs, distant. Abdomen: Soft, non-tender, non-distended, BS + x 4.  Extremities: No clubbing, cyanosis or edema. DP/PT/Radials 2+ and equal bilaterally.  Accessory Clinical Findings  CBC  Recent Labs  03/13/15 1515 03/14/15 0239  WBC 11.6* 16.9*  HGB 14.5 15.0  HCT 42.6 44.8  MCV 94.0 94.1  PLT 256 242     Basic Metabolic Panel  Recent Labs  03/13/15 1515 03/14/15 0239  NA 136 135  K 5.0 4.4  CL 107 102  CO2 18* 20*  GLUCOSE 147* 109*  BUN 14 17  CREATININE 1.58* 1.48*  CALCIUM 8.9 9.1   Liver Function Tests  Recent Labs  03/13/15 1515  AST 102*  ALT 42  ALKPHOS 43  BILITOT 0.5  PROT 7.5  ALBUMIN 3.8   No results for input(s): LIPASE, AMYLASE in the last 72 hours. Cardiac Enzymes No results for input(s): CKTOTAL, CKMB, CKMBINDEX, TROPONINI in the last 72 hours. BNP Invalid input(s): POCBNP D-Dimer No results for input(s): DDIMER in the last 72 hours. Hemoglobin A1C No results for input(s): HGBA1C in the last 72 hours. Fasting Lipid Panel  Recent Labs  03/14/15 0239  CHOL 284*  HDL 28*  LDLCALC 192*  TRIG 320*  CHOLHDL 10.1   Thyroid Function Tests No results for input(s): TSH, T4TOTAL, T3FREE, THYROIDAB in the last 72 hours.  Invalid input(s): FREET3  TELE  NSR  Radiology/Studies  No results found.  ASSESSMENT AND PLAN  1. NSTEMI - s/p  cath/PCI of LAD with no reflow phenomena. He is currently hemodynamically stable. Echo pending. Continue his current meds. Will need beta blocker/ACE inhibitor/statin 2. Morbid obesity - dietary changes will be needed. 3. Copd 4. Dyslipidemia - will start statin  [name],M.D.  03/14/2015 8:32 AM

## 2015-03-14 NOTE — Progress Notes (Signed)
  Echocardiogram 2D Echocardiogram has been performed.  Jared [name] 03/14/2015, 9:30 AM

## 2015-03-14 NOTE — Progress Notes (Signed)
CARDIAC REHAB PHASE I   PRE:  Rate/Rhythm: 103 ST    BP: sitting 145/67    SaO2: 91 RA  MODE:  Ambulation: 100 ft   POST:  Rate/Rhythm: 120 ST    BP: sitting 94/56     SaO2: 97 RA  Pt with limited ability to ambulate due to orthopedic issues and COPD. Denied CP but was SOB walking. Used RW. He mainly walks in house at home. HR up to 120 ST. Pt had coughing episode after walk and became dizzy with decrease in HR (to 80s) and BP (94/56). Pt sts this has been his normal his whole life but he is coughing more here in the hospital. Hr rebounded quickly. Ed completed with good comprehension. Pt sts he loves to smoke but is thinking about quitting. It will be hard for him, encouraged patches. He is asking for one now. Gave him a fake cigarette. He understands importance of Brilinta and is somewhat interested in CRPII. [facility]. [phone]  Elissa Lovett [place] CES, ACSM 03/14/2015 2:33 PM

## 2015-03-15 DIAGNOSIS — I5031 Acute diastolic (congestive) heart failure: Secondary | ICD-10-CM

## 2015-03-15 MED ORDER — GUAIFENESIN 100 MG/5ML PO SOLN: 5.0000 mL | ORAL | Status: DC | PRN

## 2015-03-15 MED ORDER — FUROSEMIDE 40 MG PO TABS
40.0000 mg | ORAL_TABLET | Freq: Every day | ORAL | Status: DC
  Administered 2015-03-15: 40 mg via ORAL
  Filled 2015-03-15: qty 1

## 2015-03-15 NOTE — Progress Notes (Addendum)
Patient ID: KEY CEN, male   DOB: [birth year], 54 y.o.   MRN: [id number]    Patient Name: Jared [name] Date of Encounter: 03/15/2015     Active Problems:   Unstable angina   NSTEMI (non-ST elevated myocardial infarction)    SUBJECTIVE  Sleeping but awakens to stimuli. He denies chest pain or sob. oob yesterday. Echo EF overall globally preserved LV function but difficult study.  CURRENT MEDS . antiseptic oral rinse  7 mL Mouth Rinse BID  . aspirin EC  81 mg Oral Daily  . atorvastatin  80 mg Oral q1800  . carvedilol  3.125 mg Oral BID WC  . citalopram  20 mg Oral q morning - 10a  . fenofibrate  160 mg Oral Daily  . gabapentin  300 mg Oral TID  . heparin  5,000 Units Subcutaneous 3 times per day  . mometasone-formoterol  2 puff Inhalation BID  . nicotine  21 mg Transdermal Daily  . pantoprazole  40 mg Oral Daily  . ramipril  2.5 mg Oral Daily  . sodium chloride  3 mL Intravenous Q12H  . sodium chloride  3 mL Intravenous Q12H  . ticagrelor  90 mg Oral BID    OBJECTIVE  Filed Vitals:   03/15/15 0400 03/15/15 0418 03/15/15 0700 03/15/15 0746  BP: 129/81  142/69   Pulse: 85   99  Temp:  98 F (36.7 C)  100.2 F (37.9 C)  TempSrc:  Oral  Oral  Resp: 15   28  Height:      Weight:      SpO2: 94%   95%    Intake/Output Summary (Last 24 hours) at 03/15/15 0914 Last data filed at 03/15/15 0700  Gross per 24 hour  Intake    755 ml  Output   1275 ml  Net   -520 ml   Filed Weights   03/13/15 1334  Weight: 404 lb (183.253 kg)    PHYSICAL EXAM  General: Pleasant, morbidly obese, NAD. Neuro: Alert and oriented X 3. Moves all extremities spontaneously. Psych: Normal affect. HEENT:  Normal  Neck: Supple without bruits or JVD. Lungs:  Resp regular and unlabored, CTA. Heart: RRR no s3, s4, or murmurs. Abdomen: Soft, non-tender, non-distended, BS + x 4.  Extremities: No clubbing, cyanosis or edema. DP/PT/Radials 2+ and equal bilaterally.  Accessory  Clinical Findings  CBC  Recent Labs  03/13/15 1515 03/14/15 0239  WBC 11.6* 16.9*  HGB 14.5 15.0  HCT 42.6 44.8  MCV 94.0 94.1  PLT 256 242   Basic Metabolic Panel  Recent Labs  03/13/15 1515 03/14/15 0239  NA 136 135  K 5.0 4.4  CL 107 102  CO2 18* 20*  GLUCOSE 147* 109*  BUN 14 17  CREATININE 1.58* 1.48*  CALCIUM 8.9 9.1   Liver Function Tests  Recent Labs  03/13/15 1515  AST 102*  ALT 42  ALKPHOS 43  BILITOT 0.5  PROT 7.5  ALBUMIN 3.8   No results for input(s): LIPASE, AMYLASE in the last 72 hours. Cardiac Enzymes No results for input(s): CKTOTAL, CKMB, CKMBINDEX, TROPONINI in the last 72 hours. BNP Invalid input(s): POCBNP D-Dimer No results for input(s): DDIMER in the last 72 hours. Hemoglobin A1C No results for input(s): HGBA1C in the last 72 hours. Fasting Lipid Panel  Recent Labs  03/14/15 0239  CHOL 284*  HDL 28*  LDLCALC 192*  TRIG 320*  CHOLHDL 10.1   Thyroid Function Tests No results for input(s): TSH,  T4TOTAL, T3FREE, THYROIDAB in the last 72 hours.  Invalid input(s): FREET3  TELE  nsr  Radiology/Studies  No results found.  ASSESSMENT AND PLAN  1. NSTEMI - s/p cath/PCI of LAD with no reflow phenomena. He is currently hemodynamically stable. Echo EF is globally preserved. Continue his current meds. We have started  beta blocker/ACE inhibitor/statin 2. Morbid obesity - dietary changes will be needed. 3. Copd - continue bronchodilators. 4. Dyslipidemia - will start statin 5. Disp. - will ambulate.  6. Acute diastolic heart failure - will give diuretic as above [name],M.D.  03/15/2015 9:14 AM

## 2015-03-16 ENCOUNTER — Encounter (HOSPITAL_COMMUNITY): Payer: Self-pay | Admitting: Interventional Cardiology

## 2015-03-16 ENCOUNTER — Inpatient Hospital Stay (HOSPITAL_COMMUNITY): Payer: Medicare Other

## 2015-03-16 DIAGNOSIS — E785 Hyperlipidemia, unspecified: Secondary | ICD-10-CM

## 2015-03-16 DIAGNOSIS — I10 Essential (primary) hypertension: Secondary | ICD-10-CM

## 2015-03-16 DIAGNOSIS — R05 Cough: Secondary | ICD-10-CM

## 2015-03-16 DIAGNOSIS — Z720 Tobacco use: Secondary | ICD-10-CM

## 2015-03-16 DIAGNOSIS — I5031 Acute diastolic (congestive) heart failure: Secondary | ICD-10-CM

## 2015-03-16 DIAGNOSIS — I2510 Atherosclerotic heart disease of native coronary artery without angina pectoris: Secondary | ICD-10-CM

## 2015-03-16 DIAGNOSIS — N179 Acute kidney failure, unspecified: Secondary | ICD-10-CM

## 2015-03-16 DIAGNOSIS — Z9861 Coronary angioplasty status: Secondary | ICD-10-CM

## 2015-03-16 DIAGNOSIS — R042 Hemoptysis: Secondary | ICD-10-CM

## 2015-03-16 DIAGNOSIS — R739 Hyperglycemia, unspecified: Secondary | ICD-10-CM

## 2015-03-16 LAB — BASIC METABOLIC PANEL
ANION GAP: 8 (ref 5–15)
Anion gap: 11 (ref 5–15)
BUN: 27 mg/dL — AB (ref 6–20)
BUN: 25 mg/dL — AB (ref 6–20)
CALCIUM: 8.8 mg/dL — AB (ref 8.9–10.3)
CALCIUM: 8.9 mg/dL (ref 8.9–10.3)
CO2: 24 mmol/L (ref 22–32)
CO2: 24 mmol/L (ref 22–32)
CREATININE: 2 mg/dL — AB (ref 0.61–1.24)
CREATININE: 1.84 mg/dL — AB (ref 0.61–1.24)
Chloride: 105 mmol/L (ref 101–111)
Chloride: 101 mmol/L (ref 101–111)
GFR calc Af Amer: 42 mL/min — ABNORMAL LOW (ref >60)
GFR calc non Af Amer: 40 mL/min — ABNORMAL LOW (ref >60)
GFR, EST AFRICAN AMERICAN: 46 mL/min — AB (ref >60)
GFR, EST NON AFRICAN AMERICAN: 36 mL/min — AB (ref >60)
GLUCOSE: 113 mg/dL — AB (ref 65–99)
Glucose, Bld: 109 mg/dL — ABNORMAL HIGH (ref 65–99)
Potassium: 3.7 mmol/L (ref 3.5–5.1)
Potassium: 3.6 mmol/L (ref 3.5–5.1)
SODIUM: 136 mmol/L (ref 135–145)
Sodium: 137 mmol/L (ref 135–145)

## 2015-03-16 LAB — TROPONIN I
Troponin I: 48.05 ng/mL (ref <0.031)
Troponin I: 42.04 ng/mL (ref <0.031)

## 2015-03-16 LAB — POCT ACTIVATED CLOTTING TIME
ACTIVATED CLOTTING TIME: 257 s
ACTIVATED CLOTTING TIME: 233 s
Activated Clotting Time: 171 seconds

## 2015-03-16 LAB — BRAIN NATRIURETIC PEPTIDE: B Natriuretic Peptide: 72.6 pg/mL (ref 0.0–100.0)

## 2015-03-16 MED ORDER — PROMETHAZINE-CODEINE 6.25-10 MG/5ML PO SYRP: 5.0000 mL | ORAL_SOLUTION | Freq: Four times a day (QID) | ORAL | Status: DC | PRN

## 2015-03-16 MED ORDER — GUAIFENESIN-DM 100-10 MG/5ML PO SYRP
5.0000 mL | ORAL_SOLUTION | ORAL | Status: DC | PRN
  Administered 2015-03-16: 5 mL via ORAL
  Filled 2015-03-16: qty 5

## 2015-03-16 MED ORDER — METOPROLOL TARTRATE 12.5 MG HALF TABLET
12.5000 mg | ORAL_TABLET | Freq: Two times a day (BID) | ORAL | Status: DC
  Administered 2015-03-16 – 2015-03-17 (×2): 12.5 mg via ORAL
  Filled 2015-03-16 (×3): qty 1

## 2015-03-16 MED ORDER — DEXTROMETHORPHAN POLISTIREX ER 30 MG/5ML PO SUER
30.0000 mg | Freq: Three times a day (TID) | ORAL | Status: DC
  Administered 2015-03-16 – 2015-03-17 (×4): 30 mg via ORAL
  Filled 2015-03-16 (×7): qty 5

## 2015-03-16 NOTE — Progress Notes (Signed)
   03/16/15 1100  Clinical Encounter Type  Visited With Patient;Family  Visit Type Initial;Spiritual support  Referral From Nurse  Spiritual Encounters  Spiritual Needs Prayer  Chaplain visited with Pt and wife; Pt was about to be transferred; Chaplain spoke with Pt and he says "he will be all right with whatever God decides"

## 2015-03-16 NOTE — Progress Notes (Addendum)
Patient: Jared [name] / Admit Date: 03/13/2015 / Date of Encounter: 03/16/2015, 8:21 AM   Subjective: Feels somewhat achy this AM. Also reports a cough - this is not unusual for him. He says yesterday AM while one of the staff was in the room he had a recurrent episode of cough syncope - says he has a long history of this. No corresponding events on telemetry. No CP or SOB.   Objective: Telemetry: NSR, brief sinus tach yesterday Physical Exam: Blood pressure 96/35, pulse 92, temperature 98.5 F (36.9 C), temperature source Oral, resp. rate 17, height  (1.803 m), weight 404 lb (183.253 kg), SpO2 100 %. General: Well developed obese WM in no acute distress. Head: Normocephalic, atraumatic, sclera non-icteric, no xanthomas, nares are without discharge. Neck: JVP not elevated. Lungs: Diffusely diminished without wheezes, rales, or rhonchi. Breathing is unlabored. Heart: RRR S1 S2 without murmurs, rubs, or gallops.  Abdomen: Soft, non-tender, non-distended with normoactive bowel sounds. No rebound/guarding. Extremities: No clubbing or cyanosis. No edema. Distal pedal pulses are 2+ and equal bilaterally. Right radial cath site without hematoma or ecchymosis. Good pulse. Neuro: Alert and oriented X 3. Moves all extremities spontaneously. Psych:  Responds to questions appropriately with a normal affect.   Intake/Output Summary (Last 24 hours) at 03/16/15 0821 Last data filed at 03/15/15 2135  Gross per 24 hour  Intake   1283 ml  Output    400 ml  Net    883 ml    Inpatient Medications:  . antiseptic oral rinse  7 mL Mouth Rinse BID  . aspirin EC  81 mg Oral Daily  . atorvastatin  80 mg Oral q1800  . carvedilol  3.125 mg Oral BID WC  . citalopram  20 mg Oral q morning - 10a  . fenofibrate  160 mg Oral Daily  . gabapentin  300 mg Oral TID  . heparin  5,000 Units Subcutaneous 3 times per day  . mometasone-formoterol  2 puff Inhalation BID  . nicotine  21 mg Transdermal Daily    . pantoprazole  40 mg Oral Daily  . sodium chloride  3 mL Intravenous Q12H  . sodium chloride  3 mL Intravenous Q12H  . ticagrelor  90 mg Oral BID   Infusions:    Labs:  Recent Labs  03/14/15 0239 03/16/15 0219  NA 135 137  K 4.4 3.7  CL 102 105  CO2 20* 24  GLUCOSE 109* 113*  BUN 17 27*  CREATININE 1.48* 2.00*  CALCIUM 9.1 8.8*    Recent Labs  03/13/15 1515  AST 102*  ALT 42  ALKPHOS 43  BILITOT 0.5  PROT 7.5  ALBUMIN 3.8    Recent Labs  03/13/15 1515 03/14/15 0239  WBC 11.6* 16.9*  HGB 14.5 15.0  HCT 42.6 44.8  MCV 94.0 94.1  PLT 256 242    Recent Labs  03/16/15 0219  TROPONINI 48.05*   Invalid input(s): POCBNP No results for input(s): HGBA1C in the last 72 hours.   Radiology/Studies:  No results found.   Assessment and Plan  66M with morbid obesity, tobacco abuse, HTN, HLD, COPD and family history admitted with NSTEMI.  1. NSTEMI/newly diagnosed CAD - cath 03/13/15 totally occluded LAD s/p DES; EF 55-60%. Now on ASA, Brilinta, high dose statin and BB. Troponin sent overnight due to late order release - f/u value this AM to trend. No further CP or SOB.  2. Acute diastolic CHF, suspect component of hypertensive heart disease with severe  LVH - started on oral Lasix yesterday but Cr has bumped. Will hold further doses. Will order daily weights and I/O's. BNP was normal at [facility] on admission.  3. Cough with history of cough syncope - continue monitoring on telemetry. Will rx Robitussin DM. Order 2V CXR. F/u CBC given leukocytosis. He is afebrile. Will hold AM dose of Coreg given hypotension and switch to metoprolol given h/o significant COPD (?bronchospasm). Will discuss further rx with MD.  4. Essential HTN - BP running on softer side at times. Given #4 will hold Lasix and ramipril. See above re: BB.  5. Acute kidney injury - ?possible CKD - Cr normal in 2014, 1.39 on admission at [facility]. Has since trended 1.58->1.48->2.00. Hold ACEI and Lasix.  Cannot exclude component of CIN. Will discuss gentle hydration with MD.   6. Dyslipidemia - now on high dose statin. LDL 192. F/u LFTs in AM.  7. Morbid obesity - lifestyle modification strongly encouraged. Per cardiac rehab note, limited ability to ambulate due to orthopedic issues and COPD. Would benefit from cardiac rehab - referral sent.  8. Hyperglycemia - check A1C with next lab draw.  9. Tobacco abuse - cessation advised.  Signed, Ronie Spies PA-C Pager: [phone]   Agree with findings by Ronie Spies PA-C  S/P NSTEMI , radial cath (DB), prox LAD PCI/Stent by Dr. Hedy Jacob with DES. Preserved LV fxn. No CP. Exam beign. Trop high but trending down. Scr trending down as well. Pt has chronic cough but now has hemoptysis. Will get PCCM to see (Dr Craige Cotta is his Pulmonologist for COPD). Tx tele. [facility]. Adjust meds as necessary. Hold off on diuretics.    Runell Gess, M.D., FACP, [facility] [address] [place], [place]  [address]  [phone] 03/16/2015 9:56 AM

## 2015-03-16 NOTE — Progress Notes (Signed)
Troponin 48.05 this AM, patient has not had any chest pain, Lisabeth Devoid, PA aware. Will order redraw and continue to monitor closely.  Toula Moos

## 2015-03-16 NOTE — Consult Note (Signed)
Name: Jared [name] MRN: [id number] DOB: 1960-12-16    ADMISSION DATE:  03/13/2015 CONSULTATION DATE:  03/16/2015  REFERRING MD :  Allyson Sabal  CHIEF COMPLAINT:  Cough with hemoptysis   HISTORY OF PRESENT ILLNESS:  54 year old smoker with a history of COPD, OSA and cough syncope. He used to see Dr. Shelle Iron in the past and is now scheduled to see Dr. Craige Cotta. PFTs in the past have surprisingly showed no airflow obstruction. He is maintained on CPAP 15 cm with a full face mask and is compliant by report. He was admitted with non-STEMI and underwent PCI with DES to LAD .he is maintained now on aspirin , brilinta, high-dose statin and beta blocker .echo has shown severe LVH , he is on Lasix for acute diastolic heart failure . He suffered some contrast induced renal injury and creatinine is now decreasing He feels like he is coming down with a cold and complains of body aches .he reports cough productive of minimal sputum , he reports blood-tinged sputum last few occasions . He reported to Dr. Allyson Sabal that he may have had an episode of this cough syncope yesterday , but denied this to me today . In the past Robitussin-DM has worked -and if this did not then codeine containing cough syrup as helped .he denies postnasal drip or nasal bleeding    PAST MEDICAL HISTORY :   has a past medical history of Tobacco abuse; Emphysema; RBBB (right bundle branch block with left anterior fascicular block); Syncope; Anxiety; Cough syncope; Back pain; GERD (gastroesophageal reflux disease); COPD (chronic obstructive pulmonary disease); High cholesterol; Asthma; Complication of anesthesia (01-02-13); OSA (obstructive sleep apnea); History of kidney stones; Neuromuscular disorder; Cancer; and Arthritis.  has past surgical history that includes surgery for sleep apnea; Hernia repair; Knee arthroscopy (Left, 01/03/2013); Cardiac catheterization (N/A, 03/13/2015); and Cardiac catheterization (N/A, 03/13/2015). Prior to Admission  medications   Medication Sig Start Date End Date Taking? Authorizing Provider  albuterol (PROVENTIL HFA;VENTOLIN HFA) 108 (90 BASE) MCG/ACT inhaler Inhale 2 puffs into the lungs every 6 (six) hours as needed for wheezing or shortness of breath. 01/05/15  Yes Coralyn Helling, MD  aspirin 81 MG tablet Take 81 mg by mouth daily. 01/03/13  Yes Jene Every, MD  citalopram (CELEXA) 20 MG tablet Take 20 mg by mouth every morning.    Yes Historical Provider, MD  diclofenac (VOLTAREN) 75 MG EC tablet Take 75 mg by mouth daily. 02/17/15  Yes Historical Provider, MD  esomeprazole (NEXIUM) 40 MG capsule Take 1 capsule (40 mg total) by mouth 2 (two) times daily. 07/26/13  Yes Barbaraann Share, MD  fenofibrate 160 MG tablet Take 1 tablet by mouth daily.  10/27/12  Yes Historical Provider, MD  fexofenadine (ALLEGRA) 180 MG tablet Take 180 mg by mouth daily as needed for allergies.    Yes Historical Provider, MD  Fluticasone-Salmeterol (ADVAIR DISKUS) 500-50 MCG/DOSE AEPB Inhale 1 puff into the lungs 2 (two) times daily. 11/06/14  Yes Barbaraann Share, MD  gabapentin (NEURONTIN) 300 MG capsule Take 300-600 mg by mouth 3 (three) times daily. Takes 1 capsule twice daily and 2 at bedtime   Yes Historical Provider, MD  HYDROcodone-acetaminophen (NORCO/VICODIN) 5-325 MG per tablet Take 1 tablet by mouth at bedtime as needed for moderate pain.  03/02/15 03/01/16 Yes Historical Provider, MD  NITROSTAT 0.4 MG SL tablet PLACE 1 TABLET UNDER THE TONGUE EVERY 5 MINS AS NEEDED FOR CHEST PAIN. 02/24/15  Yes Historical Provider, MD  simvastatin (ZOCOR) 40  MG tablet Take 40 mg by mouth as needed.  03/30/11  Yes Historical Provider, MD  tetrahydrozoline 0.05 % ophthalmic solution Place 1 drop into both eyes daily as needed (red eyes).   Yes Historical Provider, MD   No Known Allergies  FAMILY HISTORY:  family history includes Heart disease in his brother, father, and mother. SOCIAL HISTORY:  reports that he has been smoking Cigarettes.  He  started smoking about 43 years ago. He has a 80 pack-year smoking history. He has never used smokeless tobacco. He reports that he does not drink alcohol or use illicit drugs.  REVIEW OF SYSTEMS:   Constitutional: Negative for fever, chills, weight loss, malaise/fatigue and diaphoresis.  HENT: Negative for hearing loss, ear pain, nosebleeds, congestion, sore throat, neck pain, tinnitus and ear discharge.   Eyes: Negative for blurred vision, double vision, photophobia, pain, discharge and redness.  Respiratory: Negative for cough, hemoptysis, sputum production, shortness of breath, wheezing and stridor.   Cardiovascular: Negative for chest pain, palpitations, orthopnea, claudication, leg swelling and PND.  Gastrointestinal: Negative for heartburn, nausea, vomiting, abdominal pain, diarrhea, constipation, blood in stool and melena.  Genitourinary: Negative for dysuria, urgency, frequency, hematuria and flank pain.  Musculoskeletal: Negative for myalgias, back pain, joint pain and falls.  Skin: Negative for itching and rash.  Neurological: Negative for dizziness, tingling, tremors, sensory change, speech change, focal weakness, seizures, loss of consciousness, weakness and headaches.  Endo/Heme/Allergies: Negative for environmental allergies and polydipsia. Does not bruise/bleed easily.  SUBJECTIVE:   VITAL SIGNS: Temp:  [97.8 F (36.6 C)-98.5 F (36.9 C)] 98.5 F (36.9 C) (09/26 0758) Pulse Rate:  [81-101] 96 (09/26 0859) Resp:  [17-22] 18 (09/26 0859) BP: (94-123)/(35-59) 96/35 mmHg (09/26 0800) SpO2:  [95 %-100 %] 100 % (09/26 0758)  PHYSICAL EXAMINATION: Gen. Pleasant, obese, in no distress, normal affect ENT - no lesions, no post nasal drip, class 2-3 airway Neck: No JVD, no thyromegaly, no carotid bruits Lungs: no use of accessory muscles, no dullness to percussion, decreased without rales or rhonchi  Cardiovascular: Rhythm regular, heart sounds  normal, no murmurs or gallops, no  peripheral edema Abdomen: soft and non-tender, no hepatosplenomegaly, BS normal. Musculoskeletal: No deformities, no cyanosis or clubbing Neuro:  alert, non focal, no tremors    Recent Labs Lab 03/14/15 0239 03/16/15 0219 03/16/15 0840  NA 135 137 136  K 4.4 3.7 3.6  CL 102 105 101  CO2 20* 24 24  BUN 17 27* 25*  CREATININE 1.48* 2.00* 1.84*  GLUCOSE 109* 113* 109*    Recent Labs Lab 03/13/15 1515 03/14/15 0239  HGB 14.5 15.0  HCT 42.6 44.8  WBC 11.6* 16.9*  PLT 256 242   No results found.  ASSESSMENT / PLAN:  Cough syncope Hemoptysis- while on aspirin and brilinta. Unclear if this is truly coming from the lower airway, differential diagnosis includes epistaxis-although I could not inspect any bleeding points in his anterior nares.  Recommend- Delsym round-the-clock, if hemoptysis persists will use Tussionex Stop heparin and use SCDs   he clearly needs dual anti platelet therapy for DES Obtain chest x-ray for completion  OSA- continue CPAP 15 cm  Contrast-induced AKi- resolving  PCCM to follow ,  outpatient follow-up with Dr. Craige Cotta Agree with transfer to telemetry   [facility]. MD [phone]  03/16/2015, 10:30 AM

## 2015-03-16 NOTE — Care Management Important Message (Signed)
Important Message  Patient Details  Name: Jared [name] MRN: [id number] Date of Birth: March 07, 1961   Medicare Important Message Given:  Yes-second notification given    Orson Aloe 03/16/2015, 1:59 PM

## 2015-03-16 NOTE — Progress Notes (Signed)
CARDIAC REHAB PHASE I   PRE:  Rate/Rhythm: 88 SR  BP:  Lying: 90/32        SaO2: 93 RA  MODE:  Ambulation: 120 ft   POST:  Rate/Rhythm: 110 ST  BP:  Sitting: 120/58         SaO2: 96 RA  Pt ambulated 120 ft on RA, rolling walker, slow, fairly steady gait, assist x1, tolerated fair. Pt c/o bilateral knee pain, DOE and fatigue, denies CP, dizziness. Pt BP low initially, improved with ambulation. Sats good on RA. Pt states he does not walk much at home, has a bariatric walker, but tires easily.  Will use bariatric walker in the future for ambulation. Pt to recliner after walk, call bell within reach. Pt getting ready to transfer to telemetry floor, needs education review prior to discharge. Will follow.  [phone]  Joylene Grapes, RN, BSN 03/16/2015 11:46 AM

## 2015-03-17 ENCOUNTER — Encounter (HOSPITAL_COMMUNITY): Payer: Self-pay | Admitting: Physician Assistant

## 2015-03-17 DIAGNOSIS — R042 Hemoptysis: Secondary | ICD-10-CM

## 2015-03-17 LAB — HEPATIC FUNCTION PANEL
ALBUMIN: 3.1 g/dL — AB (ref 3.5–5.0)
ALT: 38 U/L (ref 17–63)
AST: 53 U/L — ABNORMAL HIGH (ref 15–41)
Alkaline Phosphatase: 38 U/L (ref 38–126)
Bilirubin, Direct: 0.3 mg/dL (ref 0.1–0.5)
Indirect Bilirubin: 0.8 mg/dL (ref 0.3–0.9)
TOTAL PROTEIN: 6.3 g/dL — AB (ref 6.5–8.1)
Total Bilirubin: 1.1 mg/dL (ref 0.3–1.2)

## 2015-03-17 LAB — CBC
HEMATOCRIT: 37 % — AB (ref 39.0–52.0)
Hemoglobin: 12.6 g/dL — ABNORMAL LOW (ref 13.0–17.0)
MCH: 31.9 pg (ref 26.0–34.0)
MCHC: 34.1 g/dL (ref 30.0–36.0)
MCV: 93.7 fL (ref 78.0–100.0)
Platelets: 222 10*3/uL (ref 150–400)
RBC: 3.95 MIL/uL — AB (ref 4.22–5.81)
RDW: 14.2 % (ref 11.5–15.5)
WBC: 9.4 10*3/uL (ref 4.0–10.5)

## 2015-03-17 LAB — HEMOGLOBIN A1C
HEMOGLOBIN A1C: 5.9 % — AB (ref 4.8–5.6)
MEAN PLASMA GLUCOSE: 123 mg/dL

## 2015-03-17 LAB — BASIC METABOLIC PANEL
Anion gap: 10 (ref 5–15)
BUN: 23 mg/dL — ABNORMAL HIGH (ref 6–20)
CALCIUM: 8.4 mg/dL — AB (ref 8.9–10.3)
CHLORIDE: 101 mmol/L (ref 101–111)
CO2: 23 mmol/L (ref 22–32)
CREATININE: 1.78 mg/dL — AB (ref 0.61–1.24)
GFR, EST AFRICAN AMERICAN: 48 mL/min — AB (ref >60)
GFR, EST NON AFRICAN AMERICAN: 41 mL/min — AB (ref >60)
Glucose, Bld: 102 mg/dL — ABNORMAL HIGH (ref 65–99)
Potassium: 3.4 mmol/L — ABNORMAL LOW (ref 3.5–5.1)
SODIUM: 134 mmol/L — AB (ref 135–145)

## 2015-03-17 MED ORDER — AZITHROMYCIN 250 MG PO TABS: ORAL_TABLET | ORAL | Status: DC

## 2015-03-17 MED ORDER — METOPROLOL TARTRATE 25 MG PO TABS: 12.5000 mg | ORAL_TABLET | Freq: Two times a day (BID) | ORAL | Status: DC

## 2015-03-17 MED ORDER — TICAGRELOR 90 MG PO TABS: 90.0000 mg | ORAL_TABLET | Freq: Two times a day (BID) | ORAL | Status: DC

## 2015-03-17 MED ORDER — AZITHROMYCIN 250 MG PO TABS
500.0000 mg | ORAL_TABLET | Freq: Every day | ORAL | Status: AC
  Administered 2015-03-17: 500 mg via ORAL
  Filled 2015-03-17: qty 2

## 2015-03-17 MED ORDER — NICOTINE 21 MG/24HR TD PT24: 21.0000 mg | MEDICATED_PATCH | Freq: Every day | TRANSDERMAL | Status: DC

## 2015-03-17 MED ORDER — ATORVASTATIN CALCIUM 80 MG PO TABS: 80.0000 mg | ORAL_TABLET | Freq: Every day | ORAL | Status: DC

## 2015-03-17 MED ORDER — POTASSIUM CHLORIDE CRYS ER 20 MEQ PO TBCR
40.0000 meq | EXTENDED_RELEASE_TABLET | Freq: Once | ORAL | Status: AC
  Administered 2015-03-17: 40 meq via ORAL
  Filled 2015-03-17: qty 2

## 2015-03-17 MED ORDER — AZITHROMYCIN 250 MG PO TABS: 250.0000 mg | ORAL_TABLET | Freq: Every day | ORAL | Status: DC

## 2015-03-17 MED ORDER — PANTOPRAZOLE SODIUM 40 MG PO TBEC: 40.0000 mg | DELAYED_RELEASE_TABLET | Freq: Every day | ORAL | Status: DC

## 2015-03-17 MED ORDER — DEXTROMETHORPHAN POLISTIREX ER 30 MG/5ML PO SUER: 30.0000 mg | Freq: Three times a day (TID) | ORAL | Status: DC

## 2015-03-17 NOTE — Progress Notes (Addendum)
Patient Name: Jared [name] Date of Encounter: 03/17/2015  Active Problems:   Cough   Unstable angina   NSTEMI (non-ST elevated myocardial infarction)   CAD S/P percutaneous coronary angioplasty   Acute diastolic CHF (congestive heart failure)   Essential hypertension   AKI (acute kidney injury)   Dyslipidemia   Morbid obesity   Hyperglycemia   Tobacco abuse     SUBJECTIVE  Feeling well. No chest pain, sob or palpitations. Cough improved. Complains of HA on CPAP.   CURRENT MEDS . antiseptic oral rinse  7 mL Mouth Rinse BID  . aspirin EC  81 mg Oral Daily  . atorvastatin  80 mg Oral q1800  . citalopram  20 mg Oral q morning - 10a  . dextromethorphan  30 mg Oral TID  . fenofibrate  160 mg Oral Daily  . gabapentin  300 mg Oral TID  . metoprolol tartrate  12.5 mg Oral BID  . mometasone-formoterol  2 puff Inhalation BID  . nicotine  21 mg Transdermal Daily  . pantoprazole  40 mg Oral Daily  . sodium chloride  3 mL Intravenous Q12H  . sodium chloride  3 mL Intravenous Q12H  . ticagrelor  90 mg Oral BID    OBJECTIVE  Filed Vitals:   03/16/15 1400 03/16/15 1545 03/16/15 2032 03/17/15 0504  BP: 99/46 114/57 136/69 133/70  Pulse: 86 93 88 94  Temp: 97.3 F (36.3 C)  98.2 F (36.8 C) 97.8 F (36.6 C)  TempSrc: Oral  Oral Oral  Resp: Height:      Weight:    401 lb 6.4 oz (182.074 kg)  SpO2: 96%  93% 100%    Intake/Output Summary (Last 24 hours) at 03/17/15 0933 Last data filed at 03/17/15 0849  Gross per 24 hour  Intake    890 ml  Output   1050 ml  Net   -160 ml   Filed Weights   03/13/15 1334 03/16/15 0808 03/17/15 0504  Weight: 404 lb (183.253 kg) 404 lb 5.2 oz (183.4 kg) 401 lb 6.4 oz (182.074 kg)    PHYSICAL EXAM  General: Pleasant, NAD. Neuro: Alert and oriented X 3. Moves all extremities spontaneously. Psych: Normal affect. HEENT:  Normal  Neck: Supple without bruits or JVD. Lungs:  Resp regular and unlabored. Diffusely  diminished breath sound bibasilar.  Heart: RRR no s3, s4, or murmurs. Abdomen: Soft, non-tender, non-distended, BS + x 4.  Extremities: No clubbing, cyanosis or edema. DP/PT/Radials 2+ and equal bilaterally. Right radial cath site without hematoma or ecchymosis. Good pulse.  Accessory Clinical Findings  CBC  Recent Labs  03/17/15 0319  WBC 9.4  HGB 12.6*  HCT 37.0*  MCV 93.7  PLT 222   Basic Metabolic Panel  Recent Labs  03/16/15 0840 03/17/15 0319  NA 136 134*  K 3.6 3.4*  CL 101 101  CO2 24 23  GLUCOSE 109* 102*  BUN 25* 23*  CREATININE 1.84* 1.78*  CALCIUM 8.9 8.4*   Liver Function Tests  Recent Labs  03/17/15 0319  AST 53*  ALT 38  ALKPHOS 38  BILITOT 1.1  PROT 6.3*  ALBUMIN 3.1*   No results for input(s): LIPASE, AMYLASE in the last 72 hours. Cardiac Enzymes  Recent Labs  03/16/15 0219 03/16/15 0840  TROPONINI 48.05* 42.04*   BNP Invalid input(s): POCBNP D-Dimer No results for input(s): DDIMER in the last 72 hours. Hemoglobin A1C  Recent Labs  03/16/15 0844  HGBA1C 5.9*  TELE  Sinus rhythm with PVCs  Radiology/Studies  Dg Chest 2 View  03/16/2015   CLINICAL DATA:  Cough, shortness of Breath  EXAM: CHEST  2 VIEW  COMPARISON:  03/13/2015  FINDINGS: Mild peribronchial thickening and interstitial prominence likely reflects bronchitic changes. Heart is normal size. No confluent opacities or effusions. No acute bony abnormality.  IMPRESSION: Bronchitic changes.   Electronically Signed   By: Charlett Nose M.D.   On: 03/16/2015 11:14    ASSESSMENT AND PLAN   3M with morbid obesity, tobacco abuse, HTN, HLD, COPD and family history admitted with NSTEMI.  1. NSTEMI/newly diagnosed CAD - cath 03/13/15 totally occluded LAD s/p DES; EF 55-60%. - Continue ASA, Brilinta, high dose statin and BB. Troponin trending down.   2. 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 Continue to hold. Creatinine improved further. Appears euvolemic. If discharge today, address diuretics during TCM appointment and recheck BMET in 1 week.   3. Cough with history of cough syncope -  - Seen by Pulmonary. Appreciate their assistance. Meds adjusted. Discontinued heparin. On SCDs. CXR showed bronchitic change. Management per PCCM.   4. Essential HTN - BP improved . Held Lasix and ramipril due to Low BP and AKI. Resume once resolved.   5. Acute kidney injury - ?possible CKD - Cr normal in 2014, 1.39 on admission at [facility]. Has since trended 1.58->1.48->2.00. Held ACEI and Lasix. Cr improving.   6. Dyslipidemia - now on high dose statin.  03/14/2015: Cholesterol 284*; HDL 28*; LDL Cholesterol 192*; Triglycerides 320*; VLDL 64*  7. Morbid obesity - lifestyle modification strongly encouraged. Per cardiac rehab note, limited ability to ambulate due to orthopedic issues and COPD.   8. Hyperglycemia - A1C 5.9  9. Tobacco abuse - advice smoking cessation.  Dispo: On cardiac standpoint close to discharge. Will wait for PCCM recommendations.   Signed, Bhagat,Bhavinkumar PA-C   Agree with note by Chelsea Aus PA-C  Admitted with NSTEMI with cath PCI / DES prox LAD. Tx out of 2H. No CP SOB. Exam benign. SCr improving 2--->> 1.78. Minimal hemoptysis. OK for DC home today on DAPT. Needs PCCM OP f/u as well as Dr Eden Emms.  Runell Gess, M.D., FACP, [facility], [facility] [address] [place], [place]  [address]  [phone] 03/17/2015 10:44 AM

## 2015-03-17 NOTE — Progress Notes (Signed)
CARDIAC REHAB PHASE I   Pt lying in bed, declines ambulation at this time, states "I'm afraid I will have diarrhea if I get up." Pt requesting to walk later today. Completed CHF education. Reviewed CHF booklet and zone tool, daily weights, diuretics, fluid and sodium recommendation, and activity progression. Pt verbalized understanding. Pt states he has not further questions regarding education at this time. Pt asking to take a shower, RN notified. Will follow up this afternoon to ambulate as schedule permits. Pt in bed, call bell within reach.  [phone]  Joylene Grapes, RN, BSN 03/17/2015 9:48 AM

## 2015-03-17 NOTE — Care Management Note (Signed)
Case Management Note  Patient Details  Name: ABDULKAREEM BADOLATO MRN: [id number] Date of Birth: [birth year]  Subjective/Objective:   Pt admitted for Unstable Angina. Plan for home today on Brilinta.                  Action/Plan: CM did call [facility] in [place] and Medication is available. Co pay will be $30.00. Pt is aware and agreeable. No further needs from CM at this time.   Gala Lewandowsky, RN 03/17/2015, 12:55 PM

## 2015-03-17 NOTE — Discharge Summary (Signed)
Discharge Summary   Patient ID: Jared [name],  MRN: [id number], DOB/AGE: 24-Feb-1961 54 y.o.  Admit date: 03/13/2015 Discharge date: 03/17/2015  Primary Care Provider: Josue Hector Primary Cardiologist: Dr. Eden Emms  [facility])  Discharge Diagnoses Active Problems:   Cough   Unstable angina   NSTEMI (non-ST elevated myocardial infarction)   CAD S/P percutaneous coronary angioplasty   Acute diastolic CHF (congestive heart failure)   Essential hypertension   AKI (acute kidney injury)   Dyslipidemia   Morbid obesity   Hyperglycemia   Tobacco abuse   Allergies No Known Allergies  Procedures Cath 03/13/15 Conclusion     Prox LAD lesion, 100% stenosed. There is a 0% residual stenosis post intervention with a 3.5 x 16 Synergy drug-eluting stent, postdilated to greater than 4 mm in diameter. No reflow noted in the distal/apical LAD.Marland Kitchen  Assessment:  1. Severe 1V CAD with totally occluded LAD with thrombus 2. Elevated LVEDP   Echo 03/14/15 LV EF: 55% -  60%  ------------------------------------------------------------------- Indications:   CAD of native vessels 414.01.  ------------------------------------------------------------------- History:  Risk factors: Family history of coronary artery disease. Hypertension. Dyslipidemia.  ------------------------------------------------------------------- Study Conclusions  - Left ventricle: The cavity size was normal. Wall thickness was increased in a pattern of moderate to severe LVH. Systolic function was normal. The estimated ejection fraction was in the range of 55% to 60%. Doppler parameters are consistent with abnormal left ventricular relaxation (grade 1 diastolic dysfunction). - Aortic valve: Mildly calcified annulus. Normal thickness leaflets. Valve area (VTI): 3.78 cm^2. Valve area (Vmax): 4.27 cm^2. - Mitral valve: Mildly calcified annulus. Normal thickness leaflets . -  Technically difficult study. Echo contrast was used to enhance visualization.  History of Present Illness  54 y/o morbidly obese male with no known h/o CAD but with multiple risk factors including tobacco abuse, obesity, HTN, HLD and family h/o CAD, who was transferred from [facility] to [facility] 03/13/15 for evaluation/management of chest pain.   He was initially seen by Dr. Eden Emms in January 2016 for chest pain eval. Per records, he refused to undergo stress testing at that time, but it was also mentioned that his symptoms were, at that time, felt to be noncardiac and associated with his thoracic spine. He recently presented back to our office in [place], on Monday 03/09/15, and was seen by Joni Reining, NP. He noted continued CP and dyspnea. The option of stress testing was readdressed, however he continued to refuse. The possibility of undergoing a LHC was also entertained, however the patient voiced that he wanted to check with his insurance company for pre authorization, prior to scheduling an elective procedure. It should be noted that his brother had CABG in his 59s.   He presented to [facility] in the early morning hours by [facility] with a 2 hr h/o dull substernal chest pain radiating to his mid back and worse with deep inspiration. Symptoms started roughly at 11:30 PM. His last meal prior to onset of pain was at 8:00 PM (fried seafood platter). He was at his bedside getting ready to go to bed when symptoms started. He also noted radiation to both shoulder blades and down both arms. He had associated nausea w/o vomiting, diaphoresis and dyspnea. Symptoms were worse in the supine position and alleviated sitting up. Also pleuritic. He tried 4 baby ASA and SL NTG x 2 w/o relief. After 1.5 hrs of ongoing symptoms, his wife called EMS. He was given additional SL NTG by EMS w/o relief.  On arrival to [facility], EKG showed no acute abnormalities. Labs were notable for an  abnormal troponin of 0.04. Repeat troponin had increased to 0.38. D-dimer was negative. Pro-BNP was WNL at 45.73. CBC shows leukocytosis of 12.1. SCr is 1.39. He was placed on IV heparin and transferred to [facility].   On arrival to [facility], his EKG shows a chronic RBBB. He is chest pain free.   Hospital Course  He was admitted with plan for same day cath. S/p Cath -  PTCA and DES of prox LAD with no reflow phenomena and elevated LVEDP. His CP was improved post intervention. Patient had limited ability to ambulate due to orthopedic issues and COPD per cardiac rehab. Echo EF overall globally preserved LV function but difficult study. The patient was evaluated by PCCM for recurrent episode of cough syncope - patient had long history of this (Dr Craige Cotta is his Pulmonologist for COPD). He had hemoptysis while on aspirin and brilinta. Felt likely related to bronchitis as CXR normal.  He was placed on Delsym around the clock, discontinued heparin. Cough and severity of hemoptysis were much improved. He will f/u with Dr. Craige Cotta as outpatient and likely need outpatient CT chest in 2-4 weeks at their office.   He was on CPAP for OSA. He had elevated creatinine post cath that has been getting better. He was advice for smoking cessation. While coughing episode repeat troponin was 48 that was continued to trend down. No chest pain.   She has been seen by Dr. Allyson Sabal today and deemed ready for discharge home. All follow-up appointments have been scheduled. Discharge medications are listed below. Consider BMET during outpatient visit for follow renal function.   Discharge Vitals Blood pressure 133/70, pulse 94, temperature 97.8 F (36.6 C), temperature source Oral, resp. rate 20, height  (1.803 m), weight 401 lb 6.4 oz (182.074 kg), SpO2 100 %.  Filed Weights   03/13/15 1334 03/16/15 0808 03/17/15 0504  Weight:  404 lb (183.253 kg) 404 lb 5.2 oz (183.4 kg) 401 lb 6.4 oz (182.074 kg)     CBC  Recent Labs  03/17/15 0319  WBC 9.4  HGB 12.6*  HCT 37.0*  MCV 93.7  PLT 222   Basic Metabolic Panel  Recent Labs  03/16/15 0840 03/17/15 0319  NA 136 134*  K 3.6 3.4*  CL 101 101  CO2 24 23  GLUCOSE 109* 102*  BUN 25* 23*  CREATININE 1.84* 1.78*  CALCIUM 8.9 8.4*   Liver Function Tests  Recent Labs  03/17/15 0319  AST 53*  ALT 38  ALKPHOS 38  BILITOT 1.1  PROT 6.3*  ALBUMIN 3.1*   No results for input(s): LIPASE, AMYLASE in the last 72 hours. Cardiac Enzymes  Recent Labs  03/16/15 0219 03/16/15 0840  TROPONINI 48.05* 42.04*   Hemoglobin A1C  Recent Labs  03/16/15 0844  HGBA1C 5.9*    Disposition  Pt is being discharged home today in good condition.  Follow-up Plans & Appointments  Follow-up Information    Follow up with PARRETT,TAMMY, NP On 03/31/2015.   Specialty:  Nurse Practitioner   Why:  9:15 AM,  Pulmonary   Contact information:   [address] [place] [address] [phone]       Follow up with Jacolyn Reedy, PA-C On 03/25/2015.   Specialty:  Cardiology   Why:  :20 for cardiology    Contact information:   [address] [place] [address] [phone]  Discharge Instructions    Amb Referral to Cardiac Rehabilitation    Complete by:  As directed   Congestive Heart Failure: If diagnosis is Heart Failure, patient MUST meet each of the CMS criteria: 1. Left Ventricular Ejection Fraction </= 35% 2. NYHA class II-IV symptoms despite being on optimal heart failure therapy for at least 6 weeks. 3. Stable = have not had a recent (<6 weeks) or planned (<6 months) major cardiovascular hospitalization or procedure  Program Details: - Physician supervised classes - 1-3 classes per week over a 12-18 week period, generally for a total of 36 sessions  Physician Certification: I certify that the above Cardiac  Rehabilitation treatment is medically necessary and is medically approved by me for treatment of this patient. The patient is willing and cooperative, able to ambulate and medically stable to participate in exercise rehabilitation. The participant's progress and Individualized Treatment Plan will be reviewed by the Medical Director, Cardiac Rehab staff and as indicated by the Referring/Ordering Physician.  Diagnosis:   Myocardial Infarction PCI       Call MD for:  redness, tenderness, or signs of infection (pain, swelling, redness, odor or green/yellow discharge around incision site)    Complete by:  As directed      Diet - low sodium heart healthy    Complete by:  As directed      Increase activity slowly    Complete by:  As directed            F/u Labs/Studies: Bmet during outpatient visit. Consider OP f/u labs 6-8 weeks given statin initiation this admission.  Discharge Medications    Medication List    STOP taking these medications        diclofenac 75 MG EC tablet  Commonly known as:  VOLTAREN     esomeprazole 40 MG capsule  Commonly known as:  NEXIUM  Replaced by:  pantoprazole 40 MG tablet     simvastatin 40 MG tablet  Commonly known as:  ZOCOR      TAKE these medications        albuterol 108 (90 BASE) MCG/ACT inhaler  Commonly known as:  PROVENTIL HFA;VENTOLIN HFA  Inhale 2 puffs into the lungs every 6 (six) hours as needed for wheezing or shortness of breath.     aspirin 81 MG tablet  Take 81 mg by mouth daily.     atorvastatin 80 MG tablet  Commonly known as:  LIPITOR  Take 1 tablet (80 mg total) by mouth daily at 6 PM.     azithromycin 250 MG tablet  Commonly known as:  ZITHROMAX  Take 1 tablet by mouth daily starting 03/18/15  Start taking on:  03/18/2015     citalopram 20 MG tablet  Commonly known as:  CELEXA  Take 20 mg by mouth every morning.     dextromethorphan 30 MG/5ML liquid  Commonly known as:  DELSYM  Take 5 mLs (30 mg total) by mouth 3  (three) times daily.     fenofibrate 160 MG tablet  Take 1 tablet by mouth daily.     fexofenadine 180 MG tablet  Commonly known as:  ALLEGRA  Take 180 mg by mouth daily as needed for allergies.     Fluticasone-Salmeterol 500-50 MCG/DOSE Aepb  Commonly known as:  ADVAIR DISKUS  Inhale 1 puff into the lungs 2 (two) times daily.     gabapentin 300 MG capsule  Commonly known as:  NEURONTIN  Take 300-600 mg by mouth 3 (three)  times daily. Takes 1 capsule twice daily and 2 at bedtime     HYDROcodone-acetaminophen 5-325 MG tablet  Commonly known as:  NORCO/VICODIN  Take 1 tablet by mouth at bedtime as needed for moderate pain.     metoprolol tartrate 25 MG tablet  Commonly known as:  LOPRESSOR  Take 0.5 tablets (12.5 mg total) by mouth 2 (two) times daily.     nicotine 21 mg/24hr patch  Commonly known as:  NICODERM CQ - dosed in mg/24 hours  Place 1 patch (21 mg total) onto the skin daily.     NITROSTAT 0.4 MG SL tablet  Generic drug:  nitroGLYCERIN  PLACE 1 TABLET UNDER THE TONGUE EVERY 5 MINS AS NEEDED FOR CHEST PAIN.     pantoprazole 40 MG tablet  Commonly known as:  PROTONIX  Take 1 tablet (40 mg total) by mouth daily.     tetrahydrozoline 0.05 % ophthalmic solution  Place 1 drop into both eyes daily as needed (red eyes).     ticagrelor 90 MG Tabs tablet  Commonly known as:  BRILINTA  Take 1 tablet (90 mg total) by mouth 2 (two) times daily.        Duration of Discharge Encounter   Greater than 30 minutes including physician time.  Signed, Bhagat,Bhavinkumar PA-C 03/17/2015, 1:13 PM

## 2015-03-17 NOTE — Progress Notes (Signed)
Name: Jared [name] MRN: [id number] DOB: August 22, 1960    ADMISSION DATE:  03/13/2015 CONSULTATION DATE:  03/17/2015  REFERRING MD :  Allyson Sabal  CHIEF COMPLAINT:  Cough with hemoptysis after new initiation of dual anti-platelet s/p NSTEMI/PCI with DES    HISTORY OF PRESENT ILLNESS:  54 year old smoker with a history of COPD, OSA and cough syncope. He used to see Dr. Shelle Iron in the past and is now scheduled to see Dr. Craige Cotta. PFTs in the past have surprisingly showed no airflow obstruction. He is maintained on CPAP 15 cm with a full face mask and is compliant by report. He was admitted with non-STEMI and underwent PCI with DES to LAD .he is maintained now on aspirin , brilinta, high-dose statin and beta blocker .echo has shown severe LVH , he is on Lasix for acute diastolic heart failure . He suffered some contrast induced renal injury and creatinine is now decreasing He feels like he is coming down with a cold and complains of body aches .he reports cough productive of minimal sputum , he reports blood-tinged sputum last few occasions . He reported to Dr. Allyson Sabal that he may have had an episode of this cough syncope yesterday , but denied this to me today . In the past Robitussin-DM has worked -and if this did not then codeine containing cough syrup as helped .he denies postnasal drip or nasal bleeding    SUBJECTIVE:  Cough and severity of hemoptysis are much improved today. Now only "little specks" of red/brown in tissues with coughing. Thinks new addition of delsym is working well, and would prefer not to try anything stronger because he is already feeling weak and tired. Nares are dry and painful.   VITAL SIGNS: Temp:  [97.3 F (36.3 C)-98.3 F (36.8 C)] 97.8 F (36.6 C) (09/27 0504) Pulse Rate:  [86-94] 94 (09/27 0504) Resp:  [16-20] 20 (09/27 0504) BP: (99-136)/(41-70) 133/70 mmHg (09/27 0504) SpO2:  [93 %-100 %] 100 % (09/27 0504) Weight:  [182.074 kg (401 lb 6.4 oz)] 182.074 kg (401 lb  6.4 oz) (09/27 0504)  PHYSICAL EXAMINATION: Gen. Pleasant, morbidly obese, in no distress, normal affect ENT - no lesions, no post nasal drip Neck: No JVD, no thyromegaly, no carotid bruits Lungs: no use of accessory muscles, decreased without rales or rhonchi  Cardiovascular: Rhythm regular, heart sounds  normal, no murmurs or gallops, no peripheral edema Abdomen: soft and non-tender, BS normal. Musculoskeletal: No deformities, no cyanosis or clubbing Neuro:  alert, non focal, no tremors    Recent Labs Lab 03/16/15 0219 03/16/15 0840 03/17/15 0319  NA 137 136 134*  K 3.7 3.6 3.4*  CL 105 101 101  CO2 BUN 27* 25* 23*  CREATININE 2.00* 1.84* 1.78*  GLUCOSE 113* 109* 102*    Recent Labs Lab 03/13/15 1515 03/14/15 0239 03/17/15 0319  HGB 14.5 15.0 12.6*  HCT 42.6 44.8 37.0*  WBC 11.6* 16.9* 9.4  PLT 256 242 222   Dg Chest 2 View  03/16/2015   CLINICAL DATA:  Cough, shortness of Breath  EXAM: CHEST  2 VIEW  COMPARISON:  03/13/2015  FINDINGS: Mild peribronchial thickening and interstitial prominence likely reflects bronchitic changes. Heart is normal size. No confluent opacities or effusions. No acute bony abnormality.  IMPRESSION: Bronchitic changes.   Electronically Signed   By: Charlett Nose M.D.   On: 03/16/2015 11:14    ASSESSMENT / PLAN:  Cough syncope Hemoptysis- while on aspirin and brilinta. Unclear if this  is truly coming from the lower airway, differential diagnosis includes epistaxis-although I could not inspect any bleeding points in his anterior nares.  - Continue Delsym round-the-clock,  - If hemoptysis persists will use Tussionex - Continue to hold heparin  - SCDs  - He clearly needs dual anti platelet therapy for DES - Obtain chest x-ray for completion - Consider outpatient CT chest for completeness as he is smoker with increased risk of lung Ca  New onset Anemia, mild - could be related to pulmonary blood loss, but this has improved -  repeat CBC in AM,   OSA - continue CPAP 15 cm  Contrast-induced AKI- resolving  Joneen Roach, AGACNP-BC [facility] Pulmonology/Critical Care Pager [phone] or [phone]  03/17/2015 10:29 AM

## 2015-03-25 ENCOUNTER — Other Ambulatory Visit (HOSPITAL_COMMUNITY)
Admission: RE | Admit: 2015-03-25 | Discharge: 2015-03-25 | Disposition: A | Discharge status: Home / Self Care | Payer: Medicare Other | Source: Ambulatory Visit | Attending: Physician Assistant | Admitting: Physician Assistant

## 2015-03-25 ENCOUNTER — Ambulatory Visit (INDEPENDENT_AMBULATORY_CARE_PROVIDER_SITE_OTHER): Payer: Medicare Other | Admitting: Physician Assistant

## 2015-03-25 ENCOUNTER — Telehealth: Payer: Self-pay | Admitting: *Deleted

## 2015-03-25 ENCOUNTER — Encounter: Payer: Self-pay | Admitting: Physician Assistant

## 2015-03-25 VITALS — BP 100/60 | HR 83 | Ht 71.0 in | Wt >= 6400 oz

## 2015-03-25 DIAGNOSIS — J438 Other emphysema: Secondary | ICD-10-CM | POA: Diagnosis not present

## 2015-03-25 DIAGNOSIS — R739 Hyperglycemia, unspecified: Secondary | ICD-10-CM

## 2015-03-25 DIAGNOSIS — I2510 Atherosclerotic heart disease of native coronary artery without angina pectoris: Secondary | ICD-10-CM

## 2015-03-25 DIAGNOSIS — Z720 Tobacco use: Secondary | ICD-10-CM

## 2015-03-25 DIAGNOSIS — N289 Disorder of kidney and ureter, unspecified: Secondary | ICD-10-CM

## 2015-03-25 DIAGNOSIS — I10 Essential (primary) hypertension: Secondary | ICD-10-CM | POA: Diagnosis present

## 2015-03-25 DIAGNOSIS — Z9861 Coronary angioplasty status: Secondary | ICD-10-CM

## 2015-03-25 DIAGNOSIS — Z136 Encounter for screening for cardiovascular disorders: Secondary | ICD-10-CM

## 2015-03-25 LAB — BASIC METABOLIC PANEL
ANION GAP: 5 (ref 5–15)
BUN: 18 mg/dL (ref 6–20)
CALCIUM: 8.6 mg/dL — AB (ref 8.9–10.3)
CHLORIDE: 108 mmol/L (ref 101–111)
CO2: 27 mmol/L (ref 22–32)
Creatinine, Ser: 1.79 mg/dL — ABNORMAL HIGH (ref 0.61–1.24)
GFR calc Af Amer: 47 mL/min — ABNORMAL LOW (ref >60)
GFR calc non Af Amer: 41 mL/min — ABNORMAL LOW (ref >60)
GLUCOSE: 106 mg/dL — AB (ref 65–99)
POTASSIUM: 4.3 mmol/L (ref 3.5–5.1)
Sodium: 140 mmol/L (ref 135–145)

## 2015-03-25 NOTE — Telephone Encounter (Signed)
Left message to return call 

## 2015-03-25 NOTE — Assessment & Plan Note (Addendum)
Patient had NSTEMI 03/13/15 treated with drug-eluting stent to the LAD. Normal RCA and circumflex. EF 55-60% on 2-D echo. He has quit smoking and is trying to lose weight. He has limited secondary to his obesity and chronic knee problems. Did not qualify for cardiac rehabilitation. Continue Brilinta and aspirin and metoprolol. Blood pressure is on the low side today but he is asymptomatic. Follow-up with cardiologist in 2 months to become established.

## 2015-03-25 NOTE — Assessment & Plan Note (Signed)
Creatinine was up to 2.0 post-cath. Came down to 1.7 at discharge. We'll repeat today.

## 2015-03-25 NOTE — Assessment & Plan Note (Signed)
Blood pressure is on the low side. He is asymptomatic. Continue low-dose metoprolol.

## 2015-03-25 NOTE — Assessment & Plan Note (Signed)
Patient quit smoking and is using the nicotine patches.

## 2015-03-25 NOTE — Assessment & Plan Note (Signed)
Patient has lost 9 pounds since hospitalization. Weight loss program recommended. Exercise is limited because of chronic knee problems. He walks with a walker.

## 2015-03-25 NOTE — Progress Notes (Signed)
Cardiology Office Note   Date:  03/25/2015   ID:  Jared [name], DOB 1960/07/09, MRN [id number]  PCP:  Josue Hector, MD  Cardiologist:  Dr. Eden Emms- will now see a regular Cardiologist in [place]  Chief Complaint: Post hospital follow-up    History of Present Illness: Jared [name] is a 54 y.o. male who presents for post hospital f/u. He was admitted with a NSTEMI 03/13/15 treated with DES 100% prox LAD. He had an elevated LVEDP. 2Decho EF 55-60% with grade 1 diastolic dysfunction. He has COPD followed by Dr. Craige Cotta. He had hemoptysis on ASA and Brilinta felt related to his bronchitis. Crt was 1.58 on admission, got up to 2.0 post-cath and was 1.78 at discharge.  Patient comes in today feeling quite well. He has had no recurrent chest pain. He denies dizziness or presyncope. He has chronic dyspnea on exertions secondary to his COPD. He's had for no further hemoptysis. He quit smoking. He's lost 9 pounds. His only complaint today is his tongue feels like it is raw similar to when he burned her tongue on a hot drink. He is wondering if some of his medicines could be causing it.  Past Medical History  Diagnosis Date  . Tobacco abuse   . Emphysema   . RBBB (right bundle branch block with left anterior fascicular block)   . Syncope   . Anxiety   . Cough syncope     coughing episodes -usually morning  . Back pain   . GERD (gastroesophageal reflux disease)   . COPD (chronic obstructive pulmonary disease) (HCC)   . High cholesterol   . Asthma   . Complication of anesthesia 01-02-13    [facility]"b/p dropped" after up in room.  . OSA (obstructive sleep apnea)     settings 15-uses nightly.  Marland Kitchen History of kidney stones   . Neuromuscular disorder (HCC)     bulging disc, degenerative disc lumbar/cervical, sciatic nerve pain rt. leg  . Cancer (HCC)     skin cancer lesions removed form arms  . Arthritis     degenrative disc disease-lumbar and cervical  . CAD (coronary  artery disease)     a. cath 03/13/15 - PTCA and DES of prox LAD; Elevated LVEDP    Past Surgical History  Procedure Laterality Date  . Surgery for sleep apnea    . Hernia repair      inguinal  . Knee arthroscopy Left 01/03/2013    Procedure: LEFT KNEE ARTHROSCOPY WITH DEBRIDEMENT/MICROFRACTURE/MEDIAL FEMORAL CHONDIAL/PARTIAL MEDIAL AND LATERAL MENISCECTOMY;  Surgeon: Javier Docker, MD;  Location: [facility] ORS;  Service: Orthopedics;  Laterality: Left;  . Cardiac catheterization N/A 03/13/2015    Procedure: Left Heart Cath and Coronary Angiography;  Surgeon: Corky Crafts, MD;  Location: [facility] INVASIVE CV LAB;  Service: Cardiovascular;  Laterality: N/A;  . Cardiac catheterization N/A 03/13/2015    Procedure: Coronary Stent Intervention;  Surgeon: Corky Crafts, MD;  Location: [facility] INVASIVE CV LAB;  Service: Cardiovascular;  Laterality: N/A;     Current Outpatient Prescriptions  Medication Sig Dispense Refill  . albuterol (PROVENTIL HFA;VENTOLIN HFA) 108 (90 BASE) MCG/ACT inhaler Inhale 2 puffs into the lungs every 6 (six) hours as needed for wheezing or shortness of breath. 1 Inhaler 2  . aspirin 81 MG tablet Take 81 mg by mouth daily.    Marland Kitchen atorvastatin (LIPITOR) 80 MG tablet Take 1 tablet (80 mg total) by mouth daily at 6 PM. 30 tablet 11  . citalopram (CELEXA)  20 MG tablet Take 20 mg by mouth every morning.     Marland Kitchen dextromethorphan (DELSYM) 30 MG/5ML liquid Take 5 mLs (30 mg total) by mouth 3 (three) times daily. 89 mL 0  . fenofibrate 160 MG tablet Take 1 tablet by mouth daily.     . Fluticasone-Salmeterol (ADVAIR DISKUS) 500-50 MCG/DOSE AEPB Inhale 1 puff into the lungs 2 (two) times daily. 60 each 6  . gabapentin (NEURONTIN) 300 MG capsule Take 300-600 mg by mouth 3 (three) times daily. Takes 1 capsule twice daily and 2 at bedtime    . HYDROcodone-acetaminophen (NORCO/VICODIN) 5-325 MG per tablet Take 1 tablet by mouth at bedtime as needed for moderate pain.     . metoprolol tartrate  (LOPRESSOR) 25 MG tablet Take 0.5 tablets (12.5 mg total) by mouth 2 (two) times daily. 60 tablet 11  . nicotine (NICODERM CQ - DOSED IN MG/24 HOURS) 21 mg/24hr patch Place 1 patch (21 mg total) onto the skin daily. 28 patch 0  . NITROSTAT 0.4 MG SL tablet PLACE 1 TABLET UNDER THE TONGUE EVERY 5 MINS AS NEEDED FOR CHEST PAIN.  0  . pantoprazole (PROTONIX) 40 MG tablet Take 1 tablet (40 mg total) by mouth daily. 30 tablet 11  . ticagrelor (BRILINTA) 90 MG TABS tablet Take 1 tablet (90 mg total) by mouth 2 (two) times daily. 60 tablet 11   No current facility-administered medications for this visit.    Allergies:   Review of patient's allergies indicates no known allergies.    Social History:  The patient  reports that he quit smoking about 1 weeks ago. His smoking use included Cigarettes. He started smoking about 43 years ago. He has a 80 pack-year smoking history. He has never used smokeless tobacco. He reports that he does not drink alcohol or use illicit drugs.   Family History:  The patient's    family history includes Heart disease in his brother, father, and mother.    ROS:  Please see the history of present illness.   Otherwise, review of systems are positive for walks with a walker, chronic knee pain and back problems.   All other systems are reviewed and negative.    PHYSICAL EXAM: VS:  BP 100/60 mmHg  Pulse 83  Ht  (1.803 m)  Wt 401 lb (181.892 kg)  BMI 55.95 kg/m2  SpO2 95% , BMI Body mass index is 55.95 kg/(m^2). GEN: Obese, in no acute distress, tongue without acute lesions Neck: no JVD, HJR, carotid bruits, or masses Cardiac: RRR; no murmurs,gallop, rubs, thrill or heave,  Respiratory:  clear to auscultation bilaterally, normal work of breathing GI: soft, nontender, nondistended, + BS MS: no deformity or atrophy Extremities: Right arm and cath site without hematoma or hemorrhage good radial and brachial pulses, lower extremities without cyanosis, clubbing, edema,  good distal pulses bilaterally.  Skin: warm and dry, no rash Neuro:  Strength and sensation are intact    EKG:  EKG is ordered today. The ekg ordered today demonstrates  normal sinus rhythm with right bundle branch block poor R wave progression anteriorly nonspecific ST-T wave changes, no acute change  Recent Labs: 03/16/2015: B Natriuretic Peptide 72.6 03/17/2015: ALT 38; BUN 23*; Creatinine, Ser 1.78*; Hemoglobin 12.6*; Platelets 222; Potassium 3.4*; Sodium 134*    Lipid Panel    Component Value Date/Time   CHOL 284* 03/14/2015 0239   TRIG 320* 03/14/2015 0239   HDL 28* 03/14/2015 0239   CHOLHDL 10.1 03/14/2015 0239   VLDL  64* 03/14/2015 0239   LDLCALC 192* 03/14/2015 0239      Wt Readings from Last 3 Encounters:  03/25/15 401 lb (181.892 kg)  03/17/15 401 lb 6.4 oz (182.074 kg)  03/09/15 404 lb (183.253 kg)      Other studies Reviewed: Additional studies/ records that were reviewed today include and review of the records demonstrates: Procedures Cath 03/13/15 Conclusion       Prox LAD lesion, 100% stenosed. There is a 0% residual stenosis post intervention with a 3.5 x 16 Synergy drug-eluting stent, postdilated to greater than 4 mm in diameter. No reflow noted in the distal/apical LAD.Marland Kitchen   Assessment:  1. Severe 1V CAD with totally occluded LAD with thrombus 2. Elevated LVEDP    Echo 03/14/15 LV EF: 55% -   60%  ------------------------------------------------------------------- Indications:      CAD of native vessels 414.01.  ------------------------------------------------------------------- History:   Risk factors:  Family history of coronary artery disease. Hypertension. Dyslipidemia.  ------------------------------------------------------------------- Study Conclusions  - Left ventricle: The cavity size was normal. Wall thickness was   increased in a pattern of moderate to severe LVH. Systolic   function was normal. The estimated ejection fraction was  in the   range of 55% to 60%. Doppler parameters are consistent with   abnormal left ventricular relaxation (grade 1 diastolic   dysfunction). - Aortic valve: Mildly calcified annulus. Normal thickness   leaflets. Valve area (VTI): 3.78 cm^2. Valve area (Vmax): 4.27   cm^2. - Mitral valve: Mildly calcified annulus. Normal thickness leaflets   . - Technically difficult study. Echo contrast was used to enhance   visualization.    ASSESSMENT AND PLAN:  CAD S/P percutaneous coronary angioplasty Patient had NSTEMI 03/13/15 treated with drug-eluting stent to the LAD. Normal RCA and circumflex. EF 55-60% on 2-D echo. He has quit smoking and is trying to lose weight. He has limited secondary to his obesity and chronic knee problems. Did not qualify for cardiac rehabilitation. Continue Brilinta and aspirin and metoprolol. Blood pressure is on the low side today but he is asymptomatic. Follow-up with cardiologist in 2 months to become established.  Essential hypertension Blood pressure is on the low side. He is asymptomatic. Continue low-dose metoprolol.  COPD with emphysema Has follow-up appointment with pulmonary next week.  Tobacco abuse Patient quit smoking and is using the nicotine patches.  Morbid obesity Patient has lost 9 pounds since hospitalization. Weight loss program recommended. Exercise is limited because of chronic knee problems. He walks with a walker.  Hyperglycemia Continue Lipitor. Check fasting lipid panel and LFTs in 6 weeks.  Renal insufficiency Creatinine was up to 2.0 post-cath. Came down to 1.7 at discharge. We'll repeat today.   Elson Clan, PA-C  03/25/2015 11:51 AM    [facility] [address] [place], [place], [place]  [address] Phone: [phone]; Fax: [phone]

## 2015-03-25 NOTE — Telephone Encounter (Signed)
-----   Message from Dyann Kief, PA-C sent at 03/25/2015  3:06 PM EDT ----- Kidney function about the same creatinine 1.79

## 2015-03-25 NOTE — Assessment & Plan Note (Signed)
Has follow-up appointment with pulmonary next week.

## 2015-03-25 NOTE — Assessment & Plan Note (Signed)
Continue Lipitor. Check fasting lipid panel and LFTs in 6 weeks.

## 2015-03-25 NOTE — Patient Instructions (Signed)
Your physician recommends that you schedule a follow-up appointment in: 2 Months with Dr. Diona Browner  Your physician recommends that you continue on your current medications as directed. Please refer to the Current Medication list given to you today.  Your physician recommends that you return for lab work. Have BMet done Today  Have Fasting Lipids and LFT's done in 6 weeks (05/06/15)  Thank you for choosing [facility]!

## 2015-03-31 ENCOUNTER — Ambulatory Visit (INDEPENDENT_AMBULATORY_CARE_PROVIDER_SITE_OTHER)
Admission: RE | Admit: 2015-03-31 | Discharge: 2015-03-31 | Disposition: A | Discharge status: Home / Self Care | Payer: Medicare Other | Source: Ambulatory Visit | Attending: Adult Health | Admitting: Adult Health

## 2015-03-31 ENCOUNTER — Ambulatory Visit (INDEPENDENT_AMBULATORY_CARE_PROVIDER_SITE_OTHER): Payer: Medicare Other | Admitting: Adult Health

## 2015-03-31 DIAGNOSIS — R042 Hemoptysis: Secondary | ICD-10-CM | POA: Diagnosis not present

## 2015-03-31 DIAGNOSIS — J439 Emphysema, unspecified: Secondary | ICD-10-CM

## 2015-03-31 NOTE — Assessment & Plan Note (Signed)
Opened in error

## 2015-03-31 NOTE — Progress Notes (Signed)
Opened in error  Pt left after cxr and rescheduled appontment for next week.

## 2015-04-02 ENCOUNTER — Encounter (HOSPITAL_COMMUNITY): Payer: Self-pay | Admitting: *Deleted

## 2015-04-02 NOTE — Progress Notes (Signed)
Quick Note:  Called and spoke with pt. Reviewed result and recs. Pt voiced understanding and had no further questions. I explained to him the importance of keeping his ov on 10.19.16. Nothing further needed ______

## 2015-04-08 ENCOUNTER — Ambulatory Visit (INDEPENDENT_AMBULATORY_CARE_PROVIDER_SITE_OTHER): Payer: Medicare Other | Admitting: Adult Health

## 2015-04-08 ENCOUNTER — Encounter: Payer: Self-pay | Admitting: Adult Health

## 2015-04-08 VITALS — BP 130/80 | HR 74 | Ht 71.0 in | Wt >= 6400 oz

## 2015-04-08 DIAGNOSIS — Z9861 Coronary angioplasty status: Secondary | ICD-10-CM | POA: Diagnosis not present

## 2015-04-08 DIAGNOSIS — R042 Hemoptysis: Secondary | ICD-10-CM | POA: Diagnosis not present

## 2015-04-08 DIAGNOSIS — Z720 Tobacco use: Secondary | ICD-10-CM | POA: Diagnosis not present

## 2015-04-08 DIAGNOSIS — I2510 Atherosclerotic heart disease of native coronary artery without angina pectoris: Secondary | ICD-10-CM | POA: Diagnosis not present

## 2015-04-08 DIAGNOSIS — J449 Chronic obstructive pulmonary disease, unspecified: Secondary | ICD-10-CM | POA: Diagnosis not present

## 2015-04-08 DIAGNOSIS — G4733 Obstructive sleep apnea (adult) (pediatric): Secondary | ICD-10-CM

## 2015-04-08 DIAGNOSIS — J439 Emphysema, unspecified: Secondary | ICD-10-CM

## 2015-04-08 NOTE — Assessment & Plan Note (Signed)
Wt loss encouarged  

## 2015-04-08 NOTE — Progress Notes (Signed)
   Subjective:    Patient ID: Jared [name], male    DOB: 02/02/1961, 54 y.o.   MRN: [id number]  HPI 54 yo morbidly obese male former smoker with COPD  and OSA on CPAP   04/08/2015 Post Hospital follow up  Pt presents for a post hospital.  Recently admitted with NSTEMI , s/p stent.  Seen by Pulmonary for cough syncope and hemoptysis . Felt secondary to bronchitis .  Was recommended to get a  CT chest in few weeks with his strong smoking hx.  tx w/ zpack . CXR done last week showed improved aeration.  Remains on Advair Twice daily  For COPD .  Has some lingering cough , congestion. No hemoptysis .  Informs me he has chronic cough syncope for years.   Wearing CPAP every night . Wears avg 6hr each night Cant live without it.   He has quit smoking , very proud of himself. -Long time coming! .  Declines flu shot.    Review of Systems Constitutional:   No  weight loss, night sweats,  Fevers, chills, fatigue, or  lassitude.  HEENT:   No headaches,  Difficulty swallowing,  Tooth/dental problems, or  Sore throat,                No sneezing, itching, ear ache, nasal congestion, post nasal drip,   CV:  No chest pain,  Orthopnea, PND, swelling in lower extremities, anasarca, dizziness, palpitations, syncope.   GI  No heartburn, indigestion, abdominal pain, nausea, vomiting, diarrhea, change in bowel habits, loss of appetite, bloody stools.   Resp:    No chest wall deformity  Skin: no rash or lesions.  GU: no dysuria, change in color of urine, no urgency or frequency.  No flank pain, no hematuria   MS:  No joint pain or swelling.  No decreased range of motion.  No back pain.  Psych:  No change in mood or affect. No depression or anxiety.  No memory loss.         Objective:   Physical Exam  GEN: A/Ox3; pleasant , NAD, morbidly obese   HEENT:  [place]/AT,  EACs-clear, TMs-wnl, NOSE-clear, THROAT-clear, no lesions, no postnasal drip or exudate noted. Class 3 MP airway   NECK:   Supple w/ fair ROM; no JVD; normal carotid impulses w/o bruits; no thyromegaly or nodules palpated; no lymphadenopathy.  RESP  Clear  P & A; w/o, wheezes/ rales/ or rhonchi.no accessory muscle use, no dullness to percussion  CARD:  RRR, no m/r/g  , tr-1+ peripheral edema, pulses intact, no cyanosis or clubbing.  GI:   Soft & nt; nml bowel sounds; no organomegaly or masses detected.  Musco: Warm bil, no deformities or joint swelling noted.   Neuro: alert, no focal deficits noted.    Skin: Warm, no lesions or rashes        Assessment & Plan:

## 2015-04-08 NOTE — Progress Notes (Signed)
Reviewed and agree with assessment/plan. 

## 2015-04-08 NOTE — Assessment & Plan Note (Signed)
Smoking cessation encouraged!

## 2015-04-08 NOTE — Assessment & Plan Note (Signed)
Severe OSA Says he is doing very well on CPAP  Download requested.   Plan  reat job on not smoking.  Wear CPAP At bedtime  .  Do not drive if sleepy.  Work on weight loss.  Continue on Advair Twice daily   Set up for CT chest.  May use mucinex Twice daily  As needed  congestion Use delsym 2 tsp Twice daily  As needed  Cough  Please contact office for sooner follow up if symptoms do not improve or worsen or seek emergency care  follow up Dr. Craige CottaSood  In 3-4 months and As needed

## 2015-04-08 NOTE — Assessment & Plan Note (Signed)
COPD with chronic cough /cough syncope  Did have episode of hemoptysis with severe cough in hospital , has been recommended to have OP CT scan  Will have to do without contrast due chronic renal insufficiency .  May need to consider changing Advair (as may make cough worse.)   Plan  reat job on not smoking.  Wear CPAP At bedtime  .  Do not drive if sleepy.  Work on weight loss.  Continue on Advair Twice daily   Set up for CT chest.  May use mucinex Twice daily  As needed  congestion Use delsym 2 tsp Twice daily  As needed  Cough  Please contact office for sooner follow up if symptoms do not improve or worsen or seek emergency care  follow up Dr. Craige CottaSood  In 3-4 months and As needed

## 2015-04-08 NOTE — Patient Instructions (Addendum)
Great job on not smoking.  Wear CPAP At bedtime  .  Do not drive if sleepy.  Work on weight loss.  Continue on Advair Twice daily   Set up for CT chest.  May use mucinex Twice daily  As needed  congestion Use delsym 2 tsp Twice daily  As needed  Cough  Please contact office for sooner follow up if symptoms do not improve or worsen or seek emergency care  follow up Dr. Craige CottaSood  In 3-4 months and As needed

## 2015-04-14 ENCOUNTER — Ambulatory Visit (HOSPITAL_COMMUNITY)
Admission: RE | Admit: 2015-04-14 | Discharge: 2015-04-14 | Disposition: A | Discharge status: Home / Self Care | Payer: Medicare Other | Source: Ambulatory Visit | Attending: Adult Health | Admitting: Adult Health

## 2015-04-14 DIAGNOSIS — J439 Emphysema, unspecified: Secondary | ICD-10-CM | POA: Insufficient documentation

## 2015-04-14 DIAGNOSIS — R042 Hemoptysis: Secondary | ICD-10-CM | POA: Insufficient documentation

## 2015-04-14 DIAGNOSIS — J449 Chronic obstructive pulmonary disease, unspecified: Secondary | ICD-10-CM

## 2015-04-17 NOTE — Progress Notes (Signed)
Quick Note:  Called and spoke with pt. Reviewed results and recs. Pt voiced understanding and had no further questions. ______ 

## 2015-05-13 ENCOUNTER — Telehealth: Payer: Self-pay | Admitting: Pulmonary Disease

## 2015-05-13 NOTE — Telephone Encounter (Signed)
Pt is in donut hole, needs samples. Pt aware that samples of Advair 500 were placed up front. Nothing further needed.

## 2015-05-28 ENCOUNTER — Encounter: Payer: Self-pay | Admitting: Cardiology

## 2015-05-28 ENCOUNTER — Ambulatory Visit (INDEPENDENT_AMBULATORY_CARE_PROVIDER_SITE_OTHER): Payer: Medicare Other | Admitting: Cardiology

## 2015-05-28 VITALS — BP 122/72 | HR 81 | Ht 71.0 in | Wt >= 6400 oz

## 2015-05-28 DIAGNOSIS — F17201 Nicotine dependence, unspecified, in remission: Secondary | ICD-10-CM | POA: Diagnosis not present

## 2015-05-28 DIAGNOSIS — I2510 Atherosclerotic heart disease of native coronary artery without angina pectoris: Secondary | ICD-10-CM

## 2015-05-28 DIAGNOSIS — E785 Hyperlipidemia, unspecified: Secondary | ICD-10-CM | POA: Diagnosis not present

## 2015-05-28 DIAGNOSIS — Z9861 Coronary angioplasty status: Secondary | ICD-10-CM

## 2015-05-28 LAB — LIPID PANEL
Cholesterol: 159 mg/dL (ref 125–200)
HDL: 36 mg/dL — AB (ref >=40)
LDL Cholesterol: 97 mg/dL (ref <130)
TRIGLYCERIDES: 131 mg/dL (ref <150)
Total CHOL/HDL Ratio: 4.4 Ratio (ref <=5.0)
VLDL: 26 mg/dL (ref <30)

## 2015-05-28 LAB — HEPATIC FUNCTION PANEL
ALBUMIN: 4.1 g/dL (ref 3.6–5.1)
ALK PHOS: 38 U/L — AB (ref 40–115)
ALT: 30 U/L (ref 9–46)
AST: 31 U/L (ref 10–35)
BILIRUBIN INDIRECT: 0.4 mg/dL (ref 0.2–1.2)
BILIRUBIN TOTAL: 0.5 mg/dL (ref 0.2–1.2)
Bilirubin, Direct: 0.1 mg/dL (ref <=0.2)
Total Protein: 7.4 g/dL (ref 6.1–8.1)

## 2015-05-28 NOTE — Patient Instructions (Signed)
Your physician wants you to follow-up in: 6 months with Dr Randa SpikeMcDowell You will receive a reminder letter in the mail two months in advance. If you don't receive a letter, please call our office to schedule the follow-up appointment.   Your physician recommends that you continue on your current medications as directed. Please refer to the Current Medication list given to you today.   If you need a refill on your cardiac medications before your next appointment, please call your pharmacy.   Your physician recommends that you return for lab work in:  FASTING Lipid and liver function test   You have been referred to nutrition    Thank you for choosing  [facility] !

## 2015-05-28 NOTE — Progress Notes (Signed)
Cardiology Office Note  Date: 05/28/2015   ID: Jared [name], DOB Dec 17, 1960, MRN [id number]  PCP: Josue Hector, MD  Primary Cardiologist: Nona Dell, MD   Chief Complaint  Patient presents with  . Coronary Artery Disease   History of Present Illness: Jared [name] is a 55 y.o. male former patient of Dr. Eden Emms, now establishing with me in the office. I reviewed his records and updated the chart. He was most recently seen by Ms. Lilla Shook in October. He comes in today for a routine cardiac visit, denies any significant angina or nitroglycerin use.  He is morbidly obese, reports functional limitations related to chronic back pain and also bilateral knee pain with arthritis. He uses a walker. His weight has actually increased further over the last few months. He stopped smoking during this same time. We talked about his general diet, some strategies for weight loss, also referral to a dietitian. He may also be a good candidate to consider bariatric surgery.  He has not yet had a follow-up panel since September at which time his LDL was 192. He states that he has been taking Lipitor 80 mg daily on a regular basis since that time.  He does not exercise related to his functional limitations.  Past Medical History  Diagnosis Date  . Emphysema   . RBBB (right bundle branch block with left anterior fascicular block)   . Anxiety   . Cough syncope   . Back pain   . GERD (gastroesophageal reflux disease)   . COPD (chronic obstructive pulmonary disease) (HCC)   . Hyperlipidemia   . Asthma   . OSA (obstructive sleep apnea)     CPAP  . History of kidney stones   . Degenerative disc disease, lumbar   . Degenerative disc disease, cervical   . Arthritis   . History of skin cancer   . CAD (coronary artery disease)     a. cath 03/13/15 - PTCA and DES of prox LAD; Elevated LVEDP    Past Surgical History  Procedure Laterality Date  . Surgery for sleep apnea    .  Inguinal artery repair      nguinal  . Knee arthroscopy Left 01/03/2013    Procedure: LEFT KNEE ARTHROSCOPY WITH DEBRIDEMENT/MICROFRACTURE/MEDIAL FEMORAL CHONDIAL/PARTIAL MEDIAL AND LATERAL MENISCECTOMY;  Surgeon: Javier Docker, MD;  Location: [facility];  Service: Orthopedics;  Laterality: Left;  . Cardiac catheterization N/A 03/13/2015    Procedure: Left Heart Cath and Coronary Angiography;  Surgeon: Corky Crafts, MD;  Location: [facility] INVASIVE CV LAB;  Service: Cardiovascular;  Laterality: N/A;  . Cardiac catheterization N/A 03/13/2015    Procedure: Coronary Stent Intervention;  Surgeon: Corky Crafts, MD;  Location: [facility] INVASIVE CV LAB;  Service: Cardiovascular;  Laterality: N/A;    Current Outpatient Prescriptions  Medication Sig Dispense Refill  . albuterol (PROVENTIL HFA;VENTOLIN HFA) 108 (90 BASE) MCG/ACT inhaler Inhale 2 puffs into the lungs every 6 (six) hours as needed for wheezing or shortness of breath. 1 Inhaler 2  . albuterol (PROVENTIL) (2.5 MG/3ML) 0.083% nebulizer solution TAKE 3 MLS (2.5 MG TOTAL) BY NEBULIZATION EVERY 6 (SIX) HOURS AS NEEDED FOR WHEEZING.  5  . aspirin 81 MG tablet Take 81 mg by mouth daily.    Marland Kitchen atorvastatin (LIPITOR) 80 MG tablet Take 1 tablet (80 mg total) by mouth daily at 6 PM. 30 tablet 11  . citalopram (CELEXA) 20 MG tablet Take 20 mg by mouth every morning.     Marland Kitchen  fenofibrate 160 MG tablet Take 1 tablet by mouth daily.     . Fluticasone-Salmeterol (ADVAIR DISKUS) 500-50 MCG/DOSE AEPB Inhale 1 puff into the lungs 2 (two) times daily. 60 each 6  . gabapentin (NEURONTIN) 300 MG capsule Take 300-600 mg by mouth 3 (three) times daily. Takes 1 capsule twice daily and 2 at bedtime    . HYDROcodone-acetaminophen (NORCO/VICODIN) 5-325 MG per tablet Take 1 tablet by mouth at bedtime as needed for moderate pain.     . metoprolol tartrate (LOPRESSOR) 25 MG tablet Take 0.5 tablets (12.5 mg total) by mouth 2 (two) times daily. 60 tablet 11  . NITROSTAT 0.4 MG SL  tablet PLACE 1 TABLET UNDER THE TONGUE EVERY 5 MINS AS NEEDED FOR CHEST PAIN.  0  . pantoprazole (PROTONIX) 40 MG tablet Take 1 tablet (40 mg total) by mouth daily. 30 tablet 11  . ticagrelor (BRILINTA) 90 MG TABS tablet Take 1 tablet (90 mg total) by mouth 2 (two) times daily. 60 tablet 11   No current facility-administered medications for this visit.   Allergies:  Review of patient's allergies indicates no known allergies.   Social History: The patient  reports that he quit smoking about 2 months ago. His smoking use included Cigarettes. He started smoking about 43 years ago. He has a 80 pack-year smoking history. He has never used smokeless tobacco. He reports that he does not drink alcohol or use illicit drugs.   ROS:  Please see the history of present illness. Otherwise, complete review of systems is positive for chronic neck, lower back, and bilateral knee pain.  All other systems are reviewed and negative.   Physical Exam: VS:  BP 122/72 mmHg  Pulse 81  Ht  (1.803 m)  Wt 425 lb (192.779 kg)  BMI 59.30 kg/m2  SpO2 95%, BMI Body mass index is 59.3 kg/(m^2).  Wt Readings from Last 3 Encounters:  05/28/15 425 lb (192.779 kg)  04/08/15 409 lb (185.521 kg)  03/25/15 401 lb (181.892 kg)    General: Morbidly obese male using a walker, appears comfortable at rest. HEENT: Conjunctiva and lids normal, oropharynx clear. Neck: Supple, increased girth, unable to assess JVP, no carotid bruits, no thyromegaly. Lungs: Diminished breath sounds but clear, nonlabored breathing at rest. Cardiac: Regular rate and rhythm, no S3 or significant systolic murmur, no pericardial rub. Abdomen: Obese with pannus, nontender, bowel sounds present. Extremities: Venous stasis distally, distal pulses 2+. Skin: Warm and dry. Musculoskeletal: No kyphosis. Neuropsychiatric: Alert and oriented x3, affect grossly appropriate.  ECG: Tracing from 03/25/2015 showed sinus rhythm with right bundle-branch block  and old anterior infarct pattern, nonspecific T-wave changes.  Recent Labwork: 03/16/2015: B Natriuretic Peptide 72.6 03/17/2015: ALT 38; AST 53*; Hemoglobin 12.6*; Platelets 222 03/25/2015: BUN 18; Creatinine, Ser 1.79*; Potassium 4.3; Sodium 140     Component Value Date/Time   CHOL 284* 03/14/2015 0239   TRIG 320* 03/14/2015 0239   HDL 28* 03/14/2015 0239   CHOLHDL 10.1 03/14/2015 0239   VLDL 64* 03/14/2015 0239   LDLCALC 192* 03/14/2015 0239    Other Studies Reviewed Today:  Cardiac catheterization 03/13/2015: Dominance: Right   Left Main  The vessel is angiographically normal.     Left Anterior Descending   . Prox LAD lesion, 100% stenosed. thrombotic .   Marland Kitchen Thrombectomy: An aspiration thrombectomy was performed on the lesion. Thrombectomy was successful.  . Supplies used: BALLOON EMERGE MR [id number]  . PCI: There is no pre-interventional antegrade distal flow (TIMI 0). Pre-stent  angioplasty was performed. A drug-eluting stent was placed. The strut is apposed. Post-stent angioplasty was performed. The post-interventional distal flow is normal (TIMI 3). The intervention was successful. The patient experienced the no reflow phenomenon in this vessel.  . Supplies used: BALLOON [place] MR 2.5X12; STENT SYNERGY DES 3.5X16; BALLOON [place] EUPHORA [id number]  . There is no residual stenosis post intervention.       Left Circumflex  The vessel is angiographically normal.     Right Coronary Artery  The vessel is angiographically normal.     Echocardiogram 03/14/2015: Study Conclusions  - Left ventricle: The cavity size was normal. Wall thickness was increased in a pattern of moderate to severe LVH. Systolic function was normal. The estimated ejection fraction was in the range of 55% to 60%. Doppler parameters are consistent with abnormal left ventricular relaxation (grade 1 diastolic dysfunction). - Aortic valve: Mildly calcified annulus. Normal thickness leaflets. Valve  area (VTI): 3.78 cm^2. Valve area (Vmax): 4.27 cm^2. - Mitral valve: Mildly calcified annulus. Normal thickness leaflets . - Technically difficult study. Echo contrast was used to enhance visualization.  Assessment and Plan:  1. Symptomatically stable CAD status post DES to the LAD in September of this year. He continues on DAPT, no reported spontaneous bleeding problems. I congratulated him on stopping smoking. He does need to work on weight loss as well. No changes made to current medical regimen.  2. Morbid obesity. We did discuss referral to a dietitian. He might be a candidate for bariatric surgery as well. He states that this has been discussed with him by his primary care provider in the past.  3. Hyperlipidemia, last LDL was 192. He has been on high-dose Lipitor since then. Repeat FLP.  4. Tobacco abuse in remission.  Current medicines were reviewed with the patient today.   Orders Placed This Encounter  Procedures  . Ambulatory referral to Nutrition and Diabetic Education    Disposition: FU with me in 6 months.   Signed, Jonelle SidleSamuel G. Lygia Olaes, MD, [facility] 05/28/2015 10:33 AM    [facility] [address], [place], [place] [address] Phone: [phone]; Fax: [phone]

## 2015-08-05 ENCOUNTER — Ambulatory Visit: Payer: Medicare Other | Admitting: Pulmonary Disease

## 2015-08-06 ENCOUNTER — Ambulatory Visit: Payer: Medicare Other | Admitting: Pulmonary Disease

## 2015-08-07 ENCOUNTER — Ambulatory Visit: Payer: Medicare Other | Admitting: Pulmonary Disease

## 2015-08-28 ENCOUNTER — Other Ambulatory Visit: Payer: Self-pay | Admitting: *Deleted

## 2015-08-28 MED ORDER — FLUTICASONE-SALMETEROL 500-50 MCG/DOSE IN AEPB: 1.0000 | INHALATION_SPRAY | Freq: Two times a day (BID) | RESPIRATORY_TRACT | Status: DC

## 2015-11-24 ENCOUNTER — Ambulatory Visit: Payer: Medicare Other | Admitting: Pulmonary Disease

## 2016-03-05 ENCOUNTER — Other Ambulatory Visit: Payer: Self-pay | Admitting: Physician Assistant

## 2016-03-07 ENCOUNTER — Other Ambulatory Visit: Payer: Self-pay | Admitting: Physician Assistant

## 2016-03-17 ENCOUNTER — Encounter: Payer: Self-pay | Admitting: Cardiology

## 2016-03-17 ENCOUNTER — Ambulatory Visit (INDEPENDENT_AMBULATORY_CARE_PROVIDER_SITE_OTHER): Payer: Medicare Other | Admitting: Cardiology

## 2016-03-17 VITALS — BP 116/67 | HR 74 | Ht 70.5 in | Wt >= 6400 oz

## 2016-03-17 DIAGNOSIS — I2510 Atherosclerotic heart disease of native coronary artery without angina pectoris: Secondary | ICD-10-CM | POA: Diagnosis not present

## 2016-03-17 DIAGNOSIS — E785 Hyperlipidemia, unspecified: Secondary | ICD-10-CM | POA: Diagnosis not present

## 2016-03-17 DIAGNOSIS — Z9989 Dependence on other enabling machines and devices: Secondary | ICD-10-CM

## 2016-03-17 DIAGNOSIS — G4733 Obstructive sleep apnea (adult) (pediatric): Secondary | ICD-10-CM

## 2016-03-17 NOTE — Progress Notes (Signed)
Cardiology Office Note  Date: 03/17/2016   ID: Jared [name], DOB [birth year], MRN [id number]  PCP: Josue Hector, MD  Primary Cardiologist: Nona Dell, MD   Chief Complaint  Patient presents with  . Coronary Artery Disease    History of Present Illness: Jared [name] is a morbidly obese 55 y.o. male seen by me for the first time in December 2016. He presents for a routine follow-up visit. Reports no definite angina symptoms, although did experience chest discomfort last night and early morning, spontaneously resolved. He took some aspirin but no nitroglycerin. Follow-up ECG today shows sinus rhythm with low voltage and right bundle branch block, no acute ST segment changes.  We discussed his medications. He is a year out from his PCI and will plan to stop Brilinta, continue aspirin. He has been tolerating Lipitor.  Follow-up lipid numbers from December 2016 are outlined below showing significant improvement in LDL from 192 down to 97.  He has not been able to lose any weight. States that he is limited by chronic back and knee pain, uses a walker. He missed his referral visit to a nutritionist but tells me that he will be seeing a weight loss clinic in [place].  Past Medical History:  Diagnosis Date  . Anxiety   . Arthritis   . Asthma   . Back pain   . CAD (coronary artery disease)    a. cath 03/13/15 - PTCA and DES of prox LAD; Elevated LVEDP  . COPD (chronic obstructive pulmonary disease) (HCC)   . Cough syncope   . Degenerative disc disease, cervical   . Degenerative disc disease, lumbar   . Emphysema   . GERD (gastroesophageal reflux disease)   . History of kidney stones   . History of skin cancer   . Hyperlipidemia   . OSA (obstructive sleep apnea)    CPAP  . RBBB (right bundle branch block with left anterior fascicular block)     Past Surgical History:  Procedure Laterality Date  . CARDIAC CATHETERIZATION N/A 03/13/2015   Procedure:  Left Heart Cath and Coronary Angiography;  Surgeon: Corky Crafts, MD;  Location[facility] INVASIVE CV LAB;  Service: Cardiovascular;  Laterality: N/A;  . CARDIAC CATHETERIZATION N/A 03/13/2015   Procedure: Coronary Stent Intervention;  Surgeon: Corky Crafts, MD;  Location: [facility] INVASIVE CV LAB;  Service: Cardiovascular;  Laterality: N/A;  . Inguinal artery repair     nguinal  . KNEE ARTHROSCOPY Left 01/03/2013   Procedure: LEFT KNEE ARTHROSCOPY WITH DEBRIDEMENT/MICROFRACTURE/MEDIAL FEMORAL CHONDIAL/PARTIAL MEDIAL AND LATERAL MENISCECTOMY;  Surgeon: Javier Docker, MD;  Location: [facility];  Service: Orthopedics;  Laterality: Left;  . Surgery for sleep apnea      Current Outpatient Prescriptions  Medication Sig Dispense Refill  . albuterol (PROVENTIL HFA;VENTOLIN HFA) 108 (90 BASE) MCG/ACT inhaler Inhale 2 puffs into the lungs every 6 (six) hours as needed for wheezing or shortness of breath. 1 Inhaler 2  . albuterol (PROVENTIL) (2.5 MG/3ML) 0.083% nebulizer solution TAKE 3 MLS (2.5 MG TOTAL) BY NEBULIZATION EVERY 6 (SIX) HOURS AS NEEDED FOR WHEEZING.  5  . aspirin 81 MG tablet Take 81 mg by mouth daily.    Marland Kitchen atorvastatin (LIPITOR) 80 MG tablet TAKE 1 TABLET (80 MG TOTAL) BY MOUTH DAILY AT 6 PM. 30 tablet 3  . citalopram (CELEXA) 20 MG tablet Take 20 mg by mouth every morning.     . fenofibrate 160 MG tablet Take 1 tablet by mouth daily.     Marland Kitchen  Fluticasone-Salmeterol (ADVAIR DISKUS) 500-50 MCG/DOSE AEPB Inhale 1 puff into the lungs 2 (two) times daily. 60 each 2  . gabapentin (NEURONTIN) 300 MG capsule Take 300-600 mg by mouth 3 (three) times daily. Takes 1 capsule twice daily and 2 at bedtime    . metoprolol tartrate (LOPRESSOR) 25 MG tablet Take 0.5 tablets (12.5 mg total) by mouth 2 (two) times daily. 60 tablet 11  . NITROSTAT 0.4 MG SL tablet PLACE 1 TABLET UNDER THE TONGUE EVERY 5 MINS AS NEEDED FOR CHEST PAIN.  0  . pantoprazole (PROTONIX) 40 MG tablet TAKE 1 TABLET (40 MG TOTAL) BY MOUTH  DAILY. 30 tablet 3  . ticagrelor (BRILINTA) 90 MG TABS tablet Take 1 tablet (90 mg total) by mouth 2 (two) times daily. 60 tablet 11   No current facility-administered medications for this visit.    Allergies:  Review of patient's allergies indicates no known allergies.   Social History: The patient  reports that he quit smoking about a year ago. His smoking use included Cigarettes. He started smoking about 44 years ago. He has a 80.00 pack-year smoking history. He has never used smokeless tobacco. He reports that he does not drink alcohol or use drugs.   ROS:  Please see the history of present illness. Otherwise, complete review of systems is positive for bilateral knee and back pain.  All other systems are reviewed and negative.   Physical Exam: VS:  BP 116/67   Pulse 74   Ht 5' 10.5" (1.791 m)   Wt (!) 447 lb (202.8 kg)   SpO2 95%   BMI 63.23 kg/m , BMI Body mass index is 63.23 kg/m.  Wt Readings from Last 3 Encounters:  03/17/16 (!) 447 lb (202.8 kg)  05/28/15 (!) 425 lb (192.8 kg)  04/08/15 (!) 409 lb (185.5 kg)    General: Morbidly obese male using a walker, appears comfortable at rest. HEENT: Conjunctiva and lids normal, oropharynx clear. Neck: Supple, increased girth, unable to assess JVP, no carotid bruits, no thyromegaly. Lungs: Diminished breath sounds but clear, nonlabored breathing at rest. Cardiac: Regular rate and rhythm, no S3 or significant systolic murmur, no pericardial rub. Abdomen: Obese with pannus, nontender, bowel sounds present. Extremities: Venous stasis distally, distal pulses 2+. Skin: Warm and dry. Musculoskeletal: No kyphosis. Neuropsychiatric: Alert and oriented x3, affect grossly appropriate.  ECG: I personally reviewed the tracing from 03/25/2015 which showed sinus rhythm with low voltage, right bundle branch block and rightward axis with old anterior infarct pattern.  Recent Labwork: 03/25/2015: BUN 18; Creatinine, Ser 1.79; Potassium 4.3;  Sodium 140 05/28/2015: ALT 30; AST 31     Component Value Date/Time   CHOL 159 05/28/2015 1052   TRIG 131 05/28/2015 1052   HDL 36 (L) 05/28/2015 1052   CHOLHDL 4.4 05/28/2015 1052   VLDL 26 05/28/2015 1052   LDLCALC 97 05/28/2015 1052    Other Studies Reviewed Today:  Cardiac catheterization 03/13/2015:    Dominance: Right   Left Main  The vessel is angiographically normal.         Left Anterior Descending   . Prox LAD lesion, 100% stenosed. thrombotic .   Marland Kitchen. Thrombectomy: An aspiration thrombectomy was performed on the lesion. Thrombectomy was successful.  . Supplies used: BALLOON EMERGE MR [id number]  . PCI: There is no pre-interventional antegrade distal flow (TIMI 0). Pre-stent angioplasty was performed. A drug-eluting stent was placed. The strut is apposed. Post-stent angioplasty was performed. The post-interventional distal flow is normal (TIMI 3). The  intervention was successful. The patient experienced the no reflow phenomenon in this vessel.  . Supplies used: BALLOON EMERGE MR 2.5X12; STENT SYNERGY DES 3.5X16; BALLOON [place] EUPHORA [id number]  . There is no residual stenosis post intervention.       Left Circumflex  The vessel is angiographically normal.     Right Coronary Artery  The vessel is angiographically normal.     Echocardiogram 03/14/2015: Study Conclusions  - Left ventricle: The cavity size was normal. Wall thickness was increased in a pattern of moderate to severe LVH. Systolic function was normal. The estimated ejection fraction was in the range of 55% to 60%. Doppler parameters are consistent with abnormal left ventricular relaxation (grade 1 diastolic dysfunction). - Aortic valve: Mildly calcified annulus. Normal thickness leaflets. Valve area (VTI): 3.78 cm^2. Valve area (Vmax): 4.27 cm^2. - Mitral valve: Mildly calcified annulus. Normal thickness leaflets . - Technically difficult study. Echo contrast was used to  enhance visualization.  Assessment and Plan:  1. CAD status post PTCA and DES placement to the LAD in September 2016. Follow-up ECG is stable. Plan to stop Brilinta and continue his baseline regimen.  2. Morbid obesity, remains a concern as far as his health. He states that he has a visit planned with a weight loss clinic in [place].  3. Hyperlipidemia, continues on Lipitor with much better lipid control.  4. OSA on CPAP.  Current medicines were reviewed with the patient today.  Disposition: Follow-up with me in 6 months.  Signed, Jared Sidle, MD, [facility] 03/17/2016 1:30 PM    [facility] at [facility] [address], [place], [place] [address] Phone: [phone]; Fax: [phone]

## 2016-03-17 NOTE — Addendum Note (Signed)
Addended by: Abelino DerrickMCGHEE, Miyana Mordecai R on: 03/17/2016 04:14 PM   Modules accepted: Orders

## 2016-03-17 NOTE — Patient Instructions (Signed)
Medication Instructions:  STOP BRILINTA  Labwork: NONE  Testing/Procedures: NONE  Follow-Up: Your physician wants you to follow-up in: 6 MONTHS.  You will receive a reminder letter in the mail two months in advance. If you don't receive a letter, please call our office to schedule the follow-up appointment.   Any Other Special Instructions Will Be Listed Below (If Applicable).     If you need a refill on your cardiac medications before your next appointment, please call your pharmacy.

## 2016-03-19 ENCOUNTER — Other Ambulatory Visit: Payer: Self-pay | Admitting: Physician Assistant

## 2016-05-03 ENCOUNTER — Other Ambulatory Visit: Payer: Self-pay | Admitting: Physician Assistant

## 2016-05-04 ENCOUNTER — Other Ambulatory Visit: Payer: Self-pay | Admitting: Physician Assistant

## 2016-05-05 ENCOUNTER — Ambulatory Visit (INDEPENDENT_AMBULATORY_CARE_PROVIDER_SITE_OTHER): Payer: Medicare Other | Admitting: Acute Care

## 2016-05-05 ENCOUNTER — Encounter: Payer: Self-pay | Admitting: Acute Care

## 2016-05-05 ENCOUNTER — Ambulatory Visit (INDEPENDENT_AMBULATORY_CARE_PROVIDER_SITE_OTHER)
Admission: RE | Admit: 2016-05-05 | Discharge: 2016-05-05 | Disposition: A | Discharge status: Home / Self Care | Payer: Medicare Other | Source: Ambulatory Visit | Attending: Acute Care | Admitting: Acute Care

## 2016-05-05 VITALS — BP 142/78 | HR 71 | Ht 71.0 in | Wt >= 6400 oz

## 2016-05-05 DIAGNOSIS — J439 Emphysema, unspecified: Secondary | ICD-10-CM | POA: Diagnosis not present

## 2016-05-05 DIAGNOSIS — G4733 Obstructive sleep apnea (adult) (pediatric): Secondary | ICD-10-CM | POA: Diagnosis not present

## 2016-05-05 DIAGNOSIS — I2510 Atherosclerotic heart disease of native coronary artery without angina pectoris: Secondary | ICD-10-CM | POA: Diagnosis not present

## 2016-05-05 DIAGNOSIS — J441 Chronic obstructive pulmonary disease with (acute) exacerbation: Secondary | ICD-10-CM

## 2016-05-05 MED ORDER — ALBUTEROL SULFATE HFA 108 (90 BASE) MCG/ACT IN AERS: 2.0000 | INHALATION_SPRAY | Freq: Four times a day (QID) | RESPIRATORY_TRACT | 4 refills | Status: DC | PRN

## 2016-05-05 MED ORDER — FLUTICASONE FUROATE-VILANTEROL 200-25 MCG/INH IN AEPB: 1.0000 | INHALATION_SPRAY | Freq: Every day | RESPIRATORY_TRACT | 0 refills | Status: DC

## 2016-05-05 MED ORDER — ALBUTEROL SULFATE (2.5 MG/3ML) 0.083% IN NEBU: 2.5000 mg | INHALATION_SOLUTION | Freq: Four times a day (QID) | RESPIRATORY_TRACT | 5 refills | Status: DC | PRN

## 2016-05-05 MED ORDER — FLUTICASONE FUROATE-VILANTEROL 200-25 MCG/INH IN AEPB: 1.0000 | INHALATION_SPRAY | Freq: Every day | RESPIRATORY_TRACT | 3 refills | Status: DC

## 2016-05-05 MED ORDER — METHYLPREDNISOLONE ACETATE 80 MG/ML IJ SUSP
120.0000 mg | Freq: Once | INTRAMUSCULAR | Status: AC
  Administered 2016-05-05: 120 mg via INTRAMUSCULAR

## 2016-05-05 MED ORDER — METHYLPREDNISOLONE ACETATE 40 MG/ML INJ SUSP (RADIOLOG: 120.0000 mg | Freq: Once | INTRAMUSCULAR | Status: DC

## 2016-05-05 NOTE — Progress Notes (Signed)
Reviewed and agree with assessment/plan.  Coralyn HellingVineet Manav Pierotti, MD [facility] 05/05/2016, 1:31 PM Pager:  [phone]

## 2016-05-05 NOTE — Assessment & Plan Note (Signed)
COPD moderate exacerbation after non-use of all maintenance and rescue medications Denies hemoptysis Would like to start Breo as Maintenence Plan: CXR today. We will call you with results. Start Breo 1 puff once daily. This is your maintenance medication. Use every day without fail. Remember to rinse mouth after use. We will send in a prescription for your albuterol nebs for shortness of breath/ wheezing as needed up to every 6 hours. We will send in a prescription for Pro Air rescue inhaler for shortness of breath or wheezing as needed up to every 6 hours. DepoMedrol 120 mg  Injection today. May use mucinex Twice daily  As needed  congestion Use delsym 2 tsp Twice daily  As needed  Cough  Follow up with Dr. Craige CottaSood in 3 months Please contact office for sooner follow up if symptoms do not improve or worsen or seek emergency care

## 2016-05-05 NOTE — Progress Notes (Signed)
History of Present Illness Jared [name] is a 55 y.o. morbidly obese  male former  smoker with COPD,and OSA on CPAP. He is followed by Dr. Craige CottaSood. Maintenance Medication: BREO 200/25 mcg PMH includes NSTEMI  03/2015  05/05/2016 Follow Up OV requested by patient: Pt. Presents to the office today for self requested follow up. He was last in the office 04/08/2015 for CPAP check. At this time he had recently quit smoking. He states he remains smoke free. I congratulated him on his 1 year anniversary.He presents to the office today with complaint of increased shortness of breath over the last 3-4 months. He states he has an increased cough with green sputum x 2 months. He states he has not had fever but has had episodes of sweating which he states is because of his weight and is not new.  He has a nebulizer machine, but has not been  using his nebs. He states he is out of the albuterol medication.Aditionally he has been out of his rescue inhaler, and has not been using his maintenance Breo. He states he is in the donut hole, and this is why he has not had access to his medications. I did tell him if he had contacted us we could have assisted with samples, but he has not called for an appointment. He is wanting new CPAP supplies. He did not bring in his chip for down load.I have requested he bring in the sim card so we can get a download. I told him once we verify compliance we can place an order for new supplies. He stated he is 100% compliant with his use. He states that his sleep is only disturbed  by his back and knee  pain . He knows he needs to lose weight. We discussed pulmonary rehab, and he states his knee pain prevents him from being able to participate. He walks with a walker.We discussed that his blood pressure today,  05/05/2016, was elevated and he states that he will follow up with his PCP/ Cardiologist.He denies chest pain, fever, orthopnea or hemoptysis. He refuses flu shot, and is up to date  with pneumovax.  Tests  05/05/2016:  CXR>> pending    CT Chest w/o Contrast 03/2015  IMPRESSION: 1. No active cardiopulmonary abnormalities. 2. Emphysema 3. LAD coronary artery calcification and/or stent noted.  Past medical hx Past Medical History:  Diagnosis Date  . Anxiety   . Arthritis   . Asthma   . Back pain   . CAD (coronary artery disease)    a. cath 03/13/15 - PTCA and DES of prox LAD; Elevated LVEDP  . COPD (chronic obstructive pulmonary disease) (HCC)   . Cough syncope   . Degenerative disc disease, cervical   . Degenerative disc disease, lumbar   . Emphysema   . GERD (gastroesophageal reflux disease)   . History of kidney stones   . History of skin cancer   . Hyperlipidemia   . OSA (obstructive sleep apnea)    CPAP  . RBBB (right bundle branch block with left anterior fascicular block)      Past surgical hx, Family hx, Social hx all reviewed.  Current Outpatient Prescriptions on File Prior to Visit  Medication Sig  . aspirin 81 MG tablet Take 81 mg by mouth daily.  Marland Kitchen. atorvastatin (LIPITOR) 80 MG tablet TAKE 1 TABLET (80 MG TOTAL) BY MOUTH DAILY AT 6 PM.  . citalopram (CELEXA) 20 MG tablet Take 20 mg by mouth every morning.   .Marland Kitchen  fenofibrate 160 MG tablet Take 1 tablet by mouth daily.   Marland Kitchen gabapentin (NEURONTIN) 300 MG capsule Take 300-600 mg by mouth 4 (four) times daily. Takes 1 capsule twice daily and 2 at bedtime   . metoprolol tartrate (LOPRESSOR) 25 MG tablet TAKE 0.5 TABLETS (12.5 MG TOTAL) BY MOUTH 2 (TWO) TIMES DAILY.  Marland Kitchen NITROSTAT 0.4 MG SL tablet PLACE 1 TABLET UNDER THE TONGUE EVERY 5 MINS AS NEEDED FOR CHEST PAIN.  Marland Kitchen pantoprazole (PROTONIX) 40 MG tablet TAKE 1 TABLET (40 MG TOTAL) BY MOUTH DAILY.  Marland Kitchen albuterol (PROVENTIL HFA;VENTOLIN HFA) 108 (90 BASE) MCG/ACT inhaler Inhale 2 puffs into the lungs every 6 (six) hours as needed for wheezing or shortness of breath. (Patient not taking: Reported on 05/05/2016)   No current facility-administered  medications on file prior to visit.      No Known Allergies  Review Of Systems:  Constitutional:   No  weight loss, night sweats,  Fevers, chills, fatigue, or  lassitude.  HEENT:   No headaches,  Difficulty swallowing,  Tooth/dental problems, or  Sore throat,                No sneezing, itching, ear ache, nasal congestion, post nasal drip,   CV:  No chest pain,  Orthopnea, PND, swelling in lower extremities, anasarca, dizziness, palpitations, syncope.   GI  No heartburn, indigestion, abdominal pain, nausea, vomiting, diarrhea, change in bowel habits, loss of appetite, bloody stools.   Resp: + shortness of breath with exertion less at rest.  + excess mucus, + productive cough,  No non-productive cough,  No coughing up of blood.  + change in color of mucus.  No wheezing.  No chest wall deformity  Skin: no rash or lesions.  GU: no dysuria, change in color of urine, no urgency or frequency.  No flank pain, no hematuria   MS:  +joint pain or swelling.  + decreased range of motion.  + back pain.  Psych:  No change in mood or affect. No depression or anxiety.  No memory loss.   Vital Signs BP (!) 142/78 (BP Location: Right Arm, Cuff Size: Normal)   Pulse 71   Ht 5\' 11"  (1.803 m)   Wt (!) 454 lb (205.9 kg)   SpO2 96%   BMI 63.32 kg/m   Body mass index is 63.32 kg/m. Physical Exam:  General- No distress,  A&Ox3, super morbidly obese, deconditioned using walker  ENT: No sinus tenderness, TM clear, pale nasal mucosa, no oral exudate,no post nasal drip, no LAN Cardiac: S1, S2, regular rate and rhythm, no murmur Chest: + wheeze/no  rales/ diminished per bases bilaterally; no accessory muscle use, no nasal flaring, no sternal retractions Abd.: Soft Non-tender, obese Ext: No clubbing cyanosis,  Trace edema Neuro:  deconditioned Skin: No rashes, warm and dry Psych: normal mood and behavior   Assessment/Plan  COPD with emphysema COPD moderate exacerbation after non-use of all  maintenance and rescue medications Denies hemoptysis Would like to start Breo as Maintenence Plan: CXR today. We will call you with results. Start Breo 1 puff once daily. This is your maintenance medication. Use every day without fail. Remember to rinse mouth after use. We will send in a prescription for your albuterol nebs for shortness of breath/ wheezing as needed up to every 6 hours. We will send in a prescription for Pro Air rescue inhaler for shortness of breath or wheezing as needed up to every 6 hours. DepoMedrol 120 mg  Injection today.  May use mucinex Twice daily  As needed  congestion Use delsym 2 tsp Twice daily  As needed  Cough  Follow up with Dr. Craige CottaSood in 3 months Please contact office for sooner follow up if symptoms do not improve or worsen or seek emergency care    Obstructive sleep apnea Per self reporting is 100% compliant with CPAP Did not bring chip for down load. Needs new supplies. Plan: Please bring the sim card to your CPAP machine so we can get a down load. Once we have a download to confirm your compliance, we can request new supplies for your CPAP machine. Continue on CPAP at bedtime. You appear to be benefiting from the treatment Goal is to wear for at least 4-6 hours each night for maximal clinical benefit. Continue to work on weight loss, as the link between excess weight  and sleep apnea is well established.  Do not drive if sleepy. Follow up with Dr. Craige CottaSood in 3 months Please contact office for sooner follow up if symptoms do not improve or worsen or seek emergency care      Bevelyn NgoSarah F Angel Hobdy, NP 05/05/2016  1:09 PM

## 2016-05-05 NOTE — Patient Instructions (Addendum)
It is nice to meet you today. CXR today. We will call you with results. Continue your Breo 1 puff once daily. This is your maintenance medication. Use every day without fail. Remember to rinse mouth after use. We will send in a prescription for your albuterol nebs for shortness of breath/ wheezing as needed up to every 6 hours. We will send in a prescription for Pro Air rescue inhaler for shortness of breath or wheezing as needed up to every 6 hours.. DepoMedrol 120 mg  Injection today. May use mucinex Twice daily  As needed  congestion Use delsym 2 tsp Twice daily  As needed  Cough  Please bring the sim card to your CPAP machine so we can get a down load. Once we have a download to confirm your compliance, we can request new supplies for your CPAP machine. Continue on CPAP at bedtime. You appear to be benefiting from the treatment Goal is to wear for at least 4-6 hours each night for maximal clinical benefit. Continue to work on weight loss, as the link between excess weight  and sleep apnea is well established.  Do not drive if sleepy. Follow up with Dr. Craige CottaSood in 3 months Please contact office for sooner follow up if symptoms do not improve or worsen or seek emergency care

## 2016-05-05 NOTE — Assessment & Plan Note (Signed)
Per self reporting is 100% compliant with CPAP Did not bring chip for down load. Needs new supplies. Plan: Please bring the sim card to your CPAP machine so we can get a down load. Once we have a download to confirm your compliance, we can request new supplies for your CPAP machine. Continue on CPAP at bedtime. You appear to be benefiting from the treatment Goal is to wear for at least 4-6 hours each night for maximal clinical benefit. Continue to work on weight loss, as the link between excess weight  and sleep apnea is well established.  Do not drive if sleepy. Follow up with Dr. Craige CottaSood in 3 months Please contact office for sooner follow up if symptoms do not improve or worsen or seek emergency care

## 2016-05-06 ENCOUNTER — Other Ambulatory Visit: Payer: Self-pay | Admitting: Acute Care

## 2016-05-10 ENCOUNTER — Telehealth: Payer: Self-pay | Admitting: Acute Care

## 2016-05-10 DIAGNOSIS — G4733 Obstructive sleep apnea (adult) (pediatric): Secondary | ICD-10-CM

## 2016-05-10 NOTE — Telephone Encounter (Signed)
VS  Please Advise  This Pt. Saw SG 05/05/16 for follow up on his CPAP and he needed supplies. According to SG notes she requested that the pt. Bring in his SD card first before we refill his supplies to obtain a proper download. Pt. Called back and stated that he looked at his machine and realized that he never received his card back in the mail from when he previously sent it to [facility] to obtain a download.   Reached out to [facility] and they stated they would reach out to the pt. About getting another sd card but they would not be able to refill pt. cpap supplies because of his insurance and the area he lives in. They stated he would have to switch his DME to either [facility] or [facility].

## 2016-05-16 NOTE — Telephone Encounter (Signed)
LM for patient x 1 

## 2016-05-16 NOTE — Telephone Encounter (Signed)
Order has been placed to change pt's DME. [facility] current pressure is set at auto 5-20. lmtcb x1 for pt.

## 2016-05-16 NOTE — Addendum Note (Signed)
Addended by: Maisie FusGREEN, Rosalind Guido M on: 05/16/2016 04:00 PM   Modules accepted: Orders

## 2016-05-16 NOTE — Telephone Encounter (Addendum)
Please send order to his new DME to arrange for new CPAP supplies while waiting for SD card download.

## 2016-05-16 NOTE — Telephone Encounter (Signed)
Order placed for CPAP supplies. Nothing further needed at this time.

## 2016-05-16 NOTE — Telephone Encounter (Signed)
Patient returning call - he can be reached at [phone] -pr

## 2016-05-16 NOTE — Telephone Encounter (Signed)
Pt Aware and will bring in his sim card this week sometime

## 2016-05-19 ENCOUNTER — Telehealth: Payer: Self-pay | Admitting: Pulmonary Disease

## 2016-05-19 NOTE — Telephone Encounter (Signed)
Spoke with the pt  He is stating [facility] needs PSG report faxed over  It is already scanned in Epic under procedures tab, so I want to know why it is that we have to fax this??? [facility]  (Note that I did go ahead and fax it and pt is aware, but we need to educate [facility] on how to locate a sleep study and be sure nothing further needed)

## 2016-05-23 NOTE — Telephone Encounter (Signed)
Staff message sent to Barbara CowerJason to make him aware that the PSG report is in the pts chart. Will sign off of message at this time.

## 2016-05-27 ENCOUNTER — Telehealth: Payer: Self-pay | Admitting: Acute Care

## 2016-05-27 NOTE — Telephone Encounter (Signed)
I have called Mr. Jared [name] with the results of his CPAP download. I explained that his download indicates continued AHI of 37.9 . There is no notation of either central or obstructive events on the printout.He states that he continues to sleep well with his CPAP. There is the possibility that this is due to poor mask fit, and need for new supplies. He states his mask does not fit well.We will order the new supplies and have patient scheduled for a follow up appointment in 8 weeks with download. He was told to bring his Sim Card with him. He verbalized understanding of the above, and had no further questions.

## 2016-07-27 ENCOUNTER — Ambulatory Visit: Payer: Medicare Other | Admitting: Acute Care

## 2016-08-11 ENCOUNTER — Ambulatory Visit: Payer: Medicare Other | Admitting: Pulmonary Disease

## 2016-08-18 ENCOUNTER — Telehealth: Payer: Self-pay | Admitting: Pulmonary Disease

## 2016-08-18 DIAGNOSIS — G4733 Obstructive sleep apnea (adult) (pediatric): Secondary | ICD-10-CM

## 2016-08-18 NOTE — Telephone Encounter (Signed)
Dr. Craige CottaSood  [facility] and they noted that it was not working correctly and that pt will need a new machine. Pt canceled his follow up appointment with you on 08/11/16 due to his machine not working properly.   Please advise if an order can be placed for a new machine

## 2016-08-18 NOTE — Telephone Encounter (Signed)
Okay to send order for CPAP 15 cm H2O with heated humidity.

## 2016-08-19 NOTE — Telephone Encounter (Signed)
Spoke with pt. He is aware that we will be sending this order to [facility]. Order has been placed. Nothing further was needed. 

## 2016-09-21 ENCOUNTER — Telehealth: Payer: Self-pay | Admitting: Pulmonary Disease

## 2016-09-21 NOTE — Telephone Encounter (Signed)
Pt c/o worsening dyspnea and low 02 sats X1 month.  Pt scheduled to see AD tomorrow.  Nothing further needed.

## 2016-09-22 ENCOUNTER — Encounter: Payer: Self-pay | Admitting: Pulmonary Disease

## 2016-09-22 ENCOUNTER — Ambulatory Visit (INDEPENDENT_AMBULATORY_CARE_PROVIDER_SITE_OTHER): Payer: Medicare Other | Admitting: Pulmonary Disease

## 2016-09-22 DIAGNOSIS — R0602 Shortness of breath: Secondary | ICD-10-CM | POA: Diagnosis not present

## 2016-09-22 DIAGNOSIS — J441 Chronic obstructive pulmonary disease with (acute) exacerbation: Secondary | ICD-10-CM | POA: Diagnosis not present

## 2016-09-22 MED ORDER — ALBUTEROL SULFATE (2.5 MG/3ML) 0.083% IN NEBU: INHALATION_SOLUTION | RESPIRATORY_TRACT | 3 refills | Status: DC

## 2016-09-22 MED ORDER — FLUTICASONE-UMECLIDIN-VILANT 100-62.5-25 MCG/INH IN AEPB: 1.0000 | INHALATION_SPRAY | Freq: Every day | RESPIRATORY_TRACT | 0 refills | Status: DC

## 2016-09-22 NOTE — Progress Notes (Signed)
Subjective:    Patient ID: Jared [name], male    DOB: 1960/11/29, 56 y.o.   MRN: [id number]  HPI Patient is being seen by Dr. Halford Chessman for her COPD and sleep apnea.   He "urgently" made this appointment for his worsening SOB over 3 months.   His CPAP machine was recently switched to 15 cm water. He is on Breo for COPD. He was last seen in the office in November for COPD exacerbation.  He has gradually worsening SOB over 3 mos. He made this appointment for this issue.  He checks his o2 with exertion and O2 sats run 87-97%. No acute issues.   He has chronic cough, productive of yellow sputum. (-) fevers, chills. (-) cp.   He is not working. (-) smoking.   He uses Breo daily. He does have a nebulizer and is NOT using it. He ran out of meds. Uses albuterol every 4 hrs during the day when awake. He feels better with albuterol MDI.   He has NOT gotten his new cpap machine since he has NOT met his deductible.  He is using his very old cpap machine (got in 2000) set at 12 cm water.    Review of Systems  Constitutional: Negative.   HENT: Negative.   Eyes: Negative.   Respiratory: Positive for cough, shortness of breath and wheezing.   Cardiovascular: Negative.   Gastrointestinal: Negative.   Endocrine: Negative.   Genitourinary: Negative.   Musculoskeletal: Negative.   Skin: Negative.   Allergic/Immunologic: Negative.   Neurological: Negative.   Hematological: Negative.   Psychiatric/Behavioral: Negative.        Objective:   Physical Exam  Vitals:  Vitals:   09/22/16 1521  BP: 132/76  Pulse: 93  SpO2: 93%  Weight: (!) 464 lb 12.8 oz (210.8 kg)  Height: '5\' 11"'  (1.803 m)    Constitutional/General:  Pleasant, morbidly obese, not in any distress,  Comfortably seating.  Well kempt Walks with a walker  Body mass index is 64.83 kg/m. Wt Readings from Last 3 Encounters:  09/22/16 (!) 464 lb 12.8 oz (210.8 kg)  05/05/16 (!) 454 lb (205.9 kg)  03/17/16 (!) 447 lb (202.8  kg)      HEENT: Pupils equal and reactive to light and accommodation. Anicteric sclerae. Normal nasal mucosa.   No oral  lesions,  mouth clear,  oropharynx clear, no postnasal drip. (-) Oral thrush. No dental caries.  Airway - Mallampati class III- IV  Neck: No masses. Midline trachea. No JVD, (-) LAD. (-) bruits appreciated.  Respiratory/Chest: Grossly normal chest. (-) deformity. (-) Accessory muscle use.  Symmetric expansion. (-) Tenderness on palpation.  Resonant on percussion.  Diminished BS on both lower lung zones. (-) wheezing, crackles,  Rhonchi in BULF. Better with coughing.  (-) egophony  Cardiovascular: Regular rate and  rhythm, heart sounds normal, no murmur or gallops, gr 1  peripheral edema  Gastrointestinal:  Normal bowel sounds. Soft, non-tender. No hepatosplenomegaly.  (-) masses.   Musculoskeletal:  Normal muscle tone. Normal gait.   Extremities: Grossly normal. (-) clubbing, cyanosis.  Gr 1  edema  Skin: (-) rash,lesions seen.   Neurological/Psychiatric : alert, oriented to time, place, person. Normal mood and affect          Assessment & Plan:  Shortness of breath Morbidly obese gentleman with worsening dyspnea over several months which is multifactorial: 1. Likely to undertreated COPD. 2. Patient with history of heart disease for which he had stent.  3.  Undertreated OSA 4. OHS 5. RVD 6. Deconditioning  Plan : 1. I mentioned to the patient that he needs to be following up with pulmonary more frequently and not make "urgent appointments" and cancelling his appointments for chronic issues.  2. Regarding COPD, we will try to switch him to Trelegy  1 puff daily and see if it makes a difference. He feels better with using albuterol MDI as well as nebulizer. My gut sense is  he should be on a nebulizer regimen if he is not better in 2 weeks. Maybe Pulmicort BID + Duoneb QID +/- Borvana.  3. Will hold off on CXR today as he does not seem to have an  acute infection. 4. We'll need PFT, ABG, CXR,  2-D echo, on follow-up.  5. He said he is working on meeting his deductible then he will try to get a new CPAP machine. He is being charged for the new CPAP machine and he was not ready for that right now. I reiterated the fact that his sleep apnea and OSA need to be treated and be corrected for his overall condition to  improve. He understands and hopefully he will try to get a new CPAP machine soon.  6. 6MWT done today did NOT show O2 desaturation.   I spent at least 25 minutes with the patient today and more than 50% was spent counseling the patient regarding disease and management and facilitating labs and medications.     Monica Becton, MD 09/22/2016, 4:06 PM [facility] Pager [phone] After 3 pm or if no answer, call [phone]

## 2016-09-22 NOTE — Addendum Note (Signed)
Addended by: Garfield Cornea L on: 09/22/2016 04:35 PM   Modules accepted: Orders

## 2016-09-22 NOTE — Assessment & Plan Note (Signed)
Morbidly obese gentleman with worsening dyspnea over several months which is multifactorial: 1. Likely to undertreated COPD. 2. Patient with history of heart disease for which he had stent.  3. Undertreated OSA 4. OHS 5. RVD 6. Deconditioning  Plan : 1. I mentioned to the patient that he needs to be following up with pulmonary more frequently and not make "urgent appointments" and cancelling his appointments for chronic issues.  2. Regarding COPD, we will try to switch him to Trelegy  1 puff daily and see if it makes a difference. He feels better with using albuterol MDI as well as nebulizer. My gut sense is  he should be on a nebulizer regimen if he is not better in 2 weeks. Maybe Pulmicort BID + Duoneb QID +/- Borvana.  3. Will hold off on CXR today as he does not seem to have an acute infection. 4. We'll need PFT, ABG, CXR,  2-D echo, on follow-up.  5. He said he is working on meeting his deductible then he will try to get a new CPAP machine. He is being charged for the new CPAP machine and he was not ready for that right now. I reiterated the fact that his sleep apnea and OSA need to be treated and be corrected for his overall condition to  improve. He understands and hopefully he will try to get a new CPAP machine soon.  6. done today did NOT show O2 desaturation.

## 2016-09-22 NOTE — Patient Instructions (Signed)
It was a pleasure taking care of you today!  You are diagnosed with Chronic Obstructive Pulmonary Disease or COPD.  COPD is a preventable and treatable disease that makes it difficult to empty air out of the lungs (airflow obstruction).  This can lead to shortness of breath.   Sometimes, when you have a lung infection, this can make your breathing worse, and will cause you to have a COPD flare-up or an acute exacerbation of COPD. Please call your primary care doctor or the office if you are having a COPD flare-up.   Smoking makes COPD worse.   Make sure you use your medications for COPD -- Maintenance medications : Trelegy 1 puff daily. Hold off on Breo while on trelegy.   Rescue medications: Albuterol 2 puffs every 4 hours as needed for shortness of breath. May also use albuterol neb every 4 hrs as needed for dyspnea.   Please rinse your mouth each time you use your maintenance medication.  Please call the office if you are having issues with your medications  Return to clinic in 2-3 weeks with Dr. Craige Cotta or NP

## 2016-09-29 ENCOUNTER — Other Ambulatory Visit: Payer: Self-pay | Admitting: Acute Care

## 2016-10-05 ENCOUNTER — Ambulatory Visit: Payer: Medicare Other | Admitting: Adult Health

## 2016-10-17 ENCOUNTER — Encounter: Payer: Self-pay | Admitting: Adult Health

## 2016-10-17 ENCOUNTER — Ambulatory Visit (INDEPENDENT_AMBULATORY_CARE_PROVIDER_SITE_OTHER): Payer: Medicare Other | Admitting: Adult Health

## 2016-10-17 DIAGNOSIS — G4733 Obstructive sleep apnea (adult) (pediatric): Secondary | ICD-10-CM | POA: Diagnosis not present

## 2016-10-17 DIAGNOSIS — J439 Emphysema, unspecified: Secondary | ICD-10-CM | POA: Diagnosis not present

## 2016-10-17 MED ORDER — FLUTICASONE-UMECLIDIN-VILANT 100-62.5-25 MCG/INH IN AEPB: 1.0000 | INHALATION_SPRAY | Freq: Every day | RESPIRATORY_TRACT | 5 refills | Status: DC

## 2016-10-17 NOTE — Patient Instructions (Signed)
Wear CPAP At bedtime  . Bring in CPAP card next visit.  Do not drive if sleepy.  Work on weight loss.  Continue on TRELEGY daily . Rinse after use.  Please contact office for sooner follow up if symptoms do not improve or worsen or seek emergency care  Follow up Dr. Craige Cotta  In 3-4 months and As needed

## 2016-10-17 NOTE — Assessment & Plan Note (Signed)
Improved symptom control on TRELEGY   Plan  Patient Instructions  Continue on TRELEGY daily . Rinse after use.  Please contact office for sooner follow up if symptoms do not improve or worsen or seek emergency care  Follow up Dr. Craige Cotta  In 3-4 months and As needed

## 2016-10-17 NOTE — Progress Notes (Signed)
  ID: Jared [name], male    DOB: 04-18-1961, 56 y.o.   MRN: [id number]  Chief Complaint  Patient presents with  . Follow-up    COPD    Referring provider: Joette Catching, MD  HPI: 56 year old male morbidly obese former smoker with COPD and obstructive sleep apnea on C Pap  TEST  NPSG 2002:  AHI 38/hr Cleda Daub 03/2012:  Normal FEV1%, mild reduction in FVC most likely due to centripetal obesity.  Cleda Daub 10/2014:  FEV1 1.52 (39%), ratio 55   10/17/2016 Follow up : COPD , OSA  Patient presents for a two-week follow-up. Patient was seen last visit with worsening shortness of breath. Patient was changed from [facility] to [facility] . He feels that this is made a big difference.  Feels that his breathing is getting back to his baseline . He has decreased cough and dyspnea.  He denies chest pain, increased swelling or orthopnea.   He remains on CPAP At bedtime  . Was suppose to get a new CPAP machine (machine not working as well with air leaks? ) however he can not afford insurance charges.  He is going to wait and see if he can save enough or get deductible down.  We discussed bringing in CPAP chip for a download on return.     Immunization History  Administered Date(s) Administered  . Influenza Whole 04/14/2006, 03/27/2009  . Pneumococcal Polysaccharide-23 01/05/2016  . Tdap 06/22/2013    Past Medical History:  Diagnosis Date  . Anxiety   . Arthritis   . Asthma   . Back pain   . CAD (coronary artery disease)    a. cath 03/13/15 - PTCA and DES of prox LAD; Elevated LVEDP  . COPD (chronic obstructive pulmonary disease) (HCC)   . Cough syncope   . Degenerative disc disease, cervical   . Degenerative disc disease, lumbar   . Emphysema   . GERD (gastroesophageal reflux disease)   . History of kidney stones   . History of skin cancer   . Hyperlipidemia   . OSA (obstructive sleep apnea)    CPAP  . RBBB (right bundle branch block with left anterior fascicular block)      Tobacco History: History  Smoking Status  . Former Smoker  . Packs/day: 2.00  . Years: 40.00  . Types: Cigarettes  . Start date: 03/08/1972  . Quit date: 03/12/2015  Smokeless Tobacco  . Never Used    Comment: started smoking at age 21.     Counseling given: Not Answered   Outpatient Encounter Prescriptions as of 10/17/2016  Medication Sig  . albuterol (PROVENTIL HFA;VENTOLIN HFA) 108 (90 BASE) MCG/ACT inhaler Inhale 2 puffs into the lungs every 6 (six) hours as needed for wheezing or shortness of breath.  Marland Kitchen albuterol (PROVENTIL) (2.5 MG/3ML) 0.083% nebulizer solution TAKE 3 MLS (2.5 MG TOTAL) BY NEBULIZATION EVERY 6 (SIX) HOURS AS NEEDED FOR WHEEZING.  Marland Kitchen aspirin 81 MG tablet Take 81 mg by mouth daily.  Marland Kitchen atorvastatin (LIPITOR) 80 MG tablet TAKE 1 TABLET (80 MG TOTAL) BY MOUTH DAILY AT 6 PM.  . citalopram (CELEXA) 20 MG tablet Take 20 mg by mouth every morning.   . diclofenac (VOLTAREN) 75 MG EC tablet Take 75 mg by mouth daily.  . fenofibrate 160 MG tablet Take 1 tablet by mouth daily.   Marland Kitchen gabapentin (NEURONTIN) 300 MG capsule Takes 1 capsule twice daily and 2 at bedtime   . HYDROcodone-acetaminophen (NORCO/VICODIN) 5-325 MG tablet Take 1 tablet by  mouth every 6 (six) hours as needed.   . metoprolol tartrate (LOPRESSOR) 25 MG tablet TAKE 0.5 TABLETS (12.5 MG TOTAL) BY MOUTH 2 (TWO) TIMES DAILY.  Marland Kitchen NITROSTAT 0.4 MG SL tablet PLACE 1 TABLET UNDER THE TONGUE EVERY 5 MINS AS NEEDED FOR CHEST PAIN.  Marland Kitchen pantoprazole (PROTONIX) 40 MG tablet TAKE 1 TABLET (40 MG TOTAL) BY MOUTH DAILY.  . [DISCONTINUED] fluticasone furoate-vilanterol (BREO ELLIPTA) 200-25 MCG/INH AEPB Inhale 1 puff into the lungs daily.  . [DISCONTINUED] Fluticasone-Umeclidin-Vilant (TRELEGY ELLIPTA) 100-62.5-25 MCG/INH AEPB Inhale 1 puff into the lungs daily.  . Fluticasone-Umeclidin-Vilant (TRELEGY ELLIPTA) 100-62.5-25 MCG/INH AEPB Inhale 1 puff into the lungs daily.   No facility-administered encounter medications on  file as of 10/17/2016.      Review of Systems  Constitutional:   No  weight loss, night sweats,  Fevers, chills,  +fatigue, or  lassitude.  HEENT:   No headaches,  Difficulty swallowing,  Tooth/dental problems, or  Sore throat,                No sneezing, itching, ear ache, nasal congestion, post nasal drip,   CV:  No chest pain,  Orthopnea, PND, swelling in lower extremities, anasarca, dizziness, palpitations, syncope.   GI  No heartburn, indigestion, abdominal pain, nausea, vomiting, diarrhea, change in bowel habits, loss of appetite, bloody stools.   Resp:  .  No chest wall deformity  Skin: no rash or lesions.  GU: no dysuria, change in color of urine, no urgency or frequency.  No flank pain, no hematuria   MS:  No joint pain or swelling.  No decreased range of motion.  No back pain.    Physical Exam  BP 132/68 (BP Location: Left Arm, Cuff Size: Normal)   Pulse 82   Ht  (1.803 m)   Wt (!) 474 lb 3.2 oz (215.1 kg)   SpO2 96%   BMI 66.14 kg/m   GEN: A/Ox3; pleasant , NAD, morbidly obese   HEENT:  /AT,  EACs-clear, TMs-wnl, NOSE-clear, THROAT-clear, no lesions, no postnasal drip or exudate noted.   NECK:  Supple w/ fair ROM; no JVD; normal carotid impulses w/o bruits; no thyromegaly or nodules palpated; no lymphadenopathy.    RESP  diminshed BS in bases . no accessory muscle use, no dullness to percussion  CARD:  RRR, no m/r/g, tr -1  peripheral edema, pulses intact, no cyanosis or clubbing.  GI:   Soft & nt; nml bowel sounds; no organomegaly or masses detected.   Musco: Warm bil, no deformities or joint swelling noted.   Neuro: alert, no focal deficits noted.    Skin: Warm, no lesions or rashes    Lab Results:  CBC    Component Value Date/Time   WBC 9.4 03/17/2015 0319   RBC 3.95 (L) 03/17/2015 0319   HGB 12.6 (L) 03/17/2015 0319   HCT 37.0 (L) 03/17/2015 0319   PLT 222 03/17/2015 0319   MCV 93.7 03/17/2015 0319   MCH 31.9 03/17/2015 0319    MCHC 34.1 03/17/2015 0319   RDW 14.2 03/17/2015 0319   LYMPHSABS 2.4 07/08/2012 0844   MONOABS 0.7 07/08/2012 0844   EOSABS 0.1 07/08/2012 0844   BASOSABS 0.0 07/08/2012 0844    BMET    Component Value Date/Time   NA 140 03/25/2015 1222   K 4.3 03/25/2015 1222   CL 108 03/25/2015 1222   CO2 27 03/25/2015 1222   GLUCOSE 106 (H) 03/25/2015 1222   BUN 18 03/25/2015 1222  CREATININE 1.79 (H) 03/25/2015 1222   CALCIUM 8.6 (L) 03/25/2015 1222   GFRNONAA 41 (L) 03/25/2015 1222   GFRAA 47 (L) 03/25/2015 1222    BNP    Component Value Date/Time   BNP 72.6 03/16/2015 0843    ProBNP No results found for: PROBNP  Imaging: No results found.   Assessment & Plan:   COPD with emphysema Improved symptom control on TRELEGY   Plan  Patient Instructions  Continue on TRELEGY daily . Rinse after use.  Please contact office for sooner follow up if symptoms do not improve or worsen or seek emergency care  Follow up Dr. Craige Cotta  In 3-4 months and As needed       Obstructive sleep apnea Cont on CPAP At bedtime   Check download on return -bring SD card.  Wt loss      Rubye Oaks, NP 10/17/2016

## 2016-10-17 NOTE — Assessment & Plan Note (Addendum)
Cont on CPAP At bedtime   Check download on return -bring SD card.  Wt loss

## 2016-10-19 NOTE — Progress Notes (Signed)
I have reviewed and agree with assessment/plan.  Justeen Hehr, MD [place] [facility] 10/19/2016, 8:44 AM Pager:  [phone]  

## 2016-10-20 ENCOUNTER — Telehealth: Payer: Self-pay | Admitting: Adult Health

## 2016-10-20 NOTE — Telephone Encounter (Signed)
Can we do tier exception?  If that will not work, maybe switch him to meds covered by his insurance?  Thanks.   Pollie MeyerJ. Angelo A de Dios, MD 10/20/2016, 4:24 PM [place] Pager [phone] After 3 pm or if no answer, call [phone]

## 2016-10-20 NOTE — Telephone Encounter (Signed)
Spoke with pt, states that his Trelegy is not covered by insurance. Called [facility] in [place], states this is a nonformulary inhaler, no covered alternatives covered.  Called [employer] at [phone], states that no covered alternatives are listed.  We can perform a tier exception.  AD please advise if you'd like to proceed with tier exception, or switch to a different inhaler.  Thanks!

## 2016-10-21 NOTE — Telephone Encounter (Signed)
Called [employer] at [phone] and spoke with Vikki PortsValerie who stated I would need to initiate  a coverage determination and she transferred me but was on hold for 10+ mins. Will try to call back

## 2016-10-24 NOTE — Telephone Encounter (Signed)
LMOM TCB x1 to pt to have him call his insurance for covered alternatives and then call us back   [employer] and for some reason this time they could not find pt

## 2016-10-25 MED ORDER — FLUTICASONE FUROATE-VILANTEROL 200-25 MCG/INH IN AEPB: 1.0000 | INHALATION_SPRAY | Freq: Every day | RESPIRATORY_TRACT | 3 refills | Status: DC

## 2016-10-25 NOTE — Telephone Encounter (Signed)
Patient states he was unable to find an alternative, would like to go back on Breo.Charm Rings.Erica R Taylor

## 2016-10-25 NOTE — Telephone Encounter (Signed)
Pt aware of rec's per Dr Christene Slatesde Dios. Breo 200 Rx sent to pharmacy per patient request. Nothing further needed.

## 2016-10-25 NOTE — Telephone Encounter (Signed)
AD  Please Advise-  Called insurance at [id number] Pt I.D is [id number]. Insurance states trelegy is a non formulary and that there are not any covered alternatives. Insurance is faxing over form to be filled out for formulary exception.   Spoke with pt and he is wanting to just switch back to [place]. Please advise if you want to continue with formulary exception or switch pt back

## 2016-10-25 NOTE — Telephone Encounter (Signed)
lmtcb X2 for pt to call insurance for covered alternatives.

## 2016-10-25 NOTE — Telephone Encounter (Signed)
Ok to transfer back to breo as he was on before. Thanks.   Pollie MeyerJ. Angelo A de Dios, MD 10/25/2016, 5:06 PM [place] [facility] Pager [phone] After 3 pm or if no answer, call [phone]

## 2016-10-28 IMAGING — DX DG CHEST 2V
2 series · 2 of 2 positions shown · non-contrast
Comparison: 03/13/2015

CLINICAL DATA: Cough, shortness of Breath

EXAM:
CHEST  2 VIEW

[chest pa]
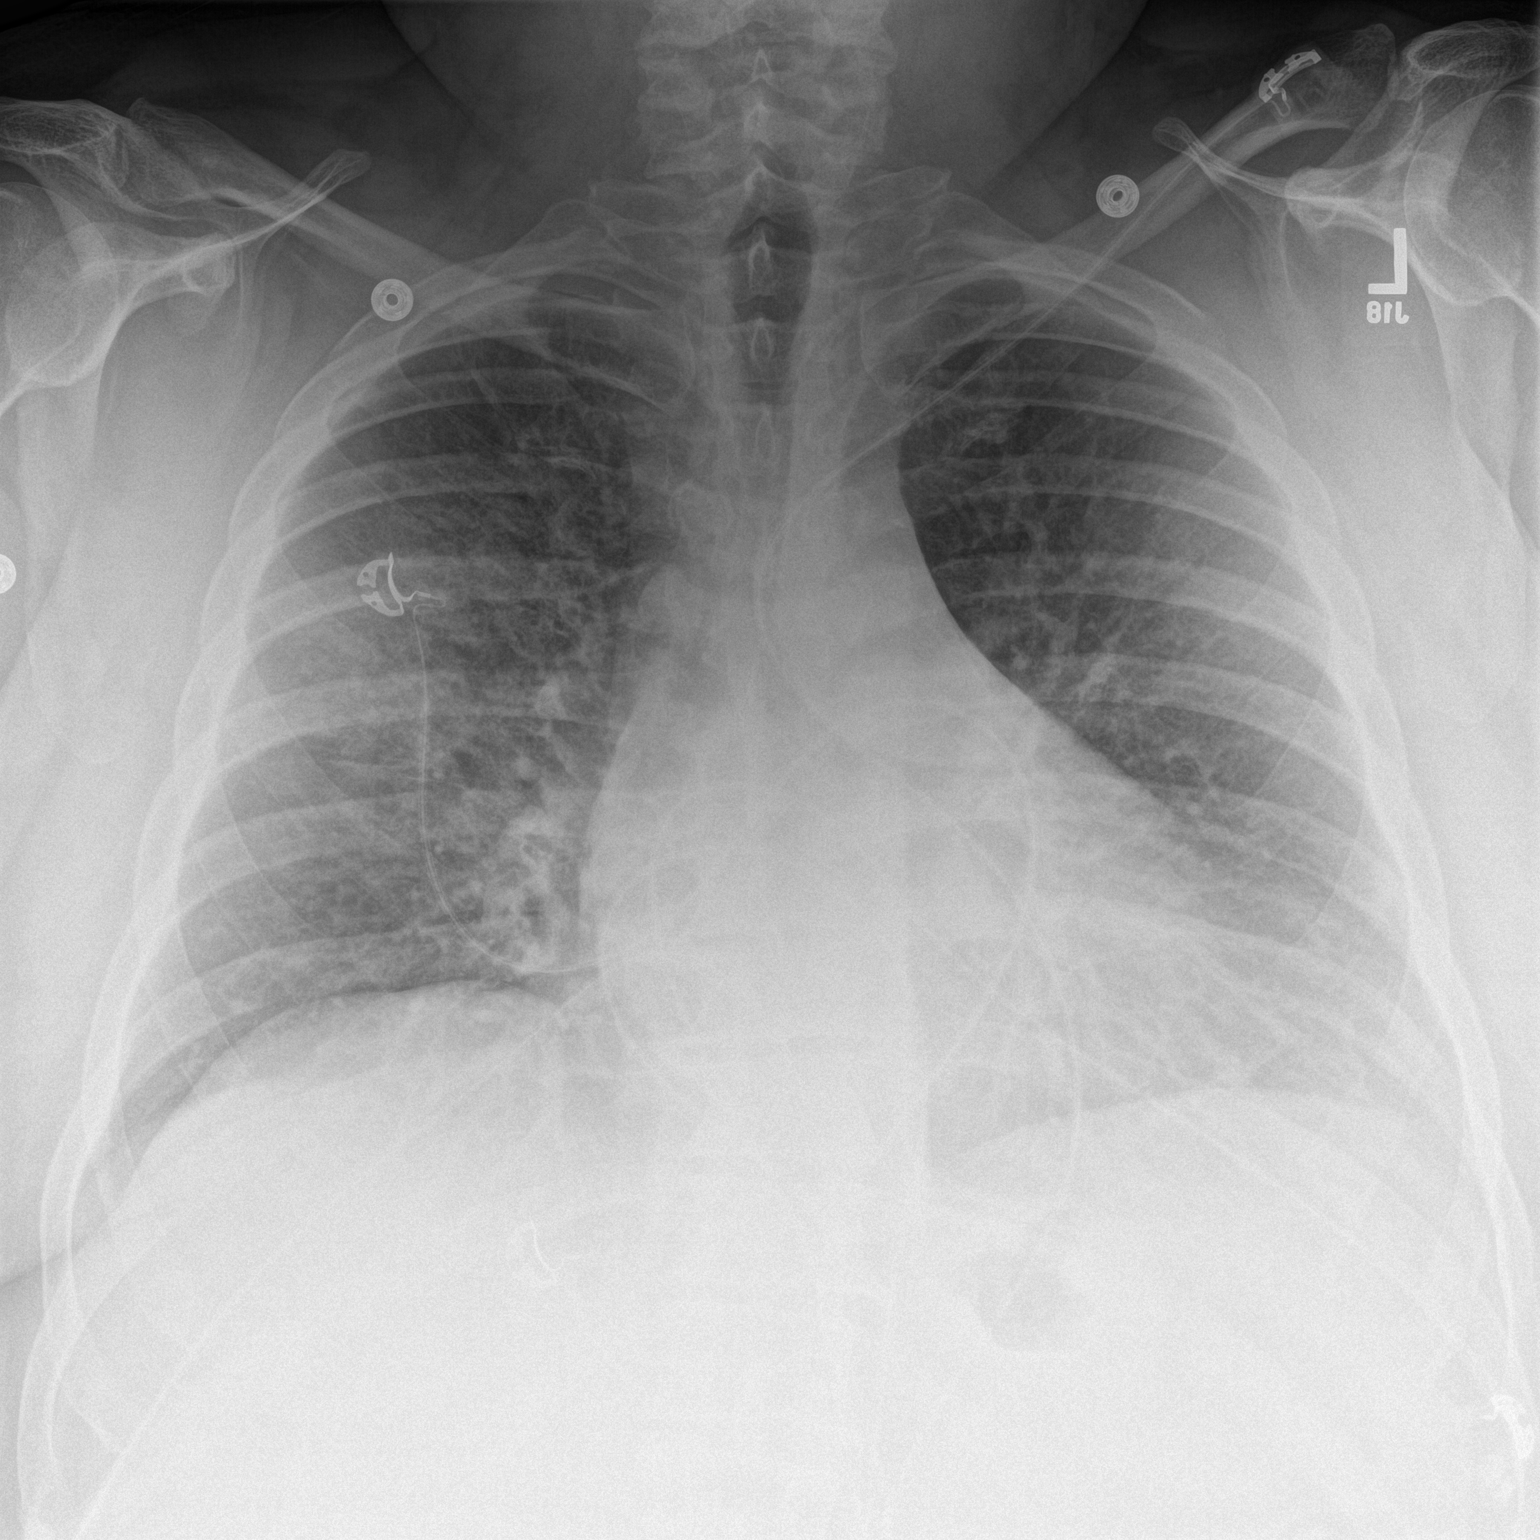

[chest lat]
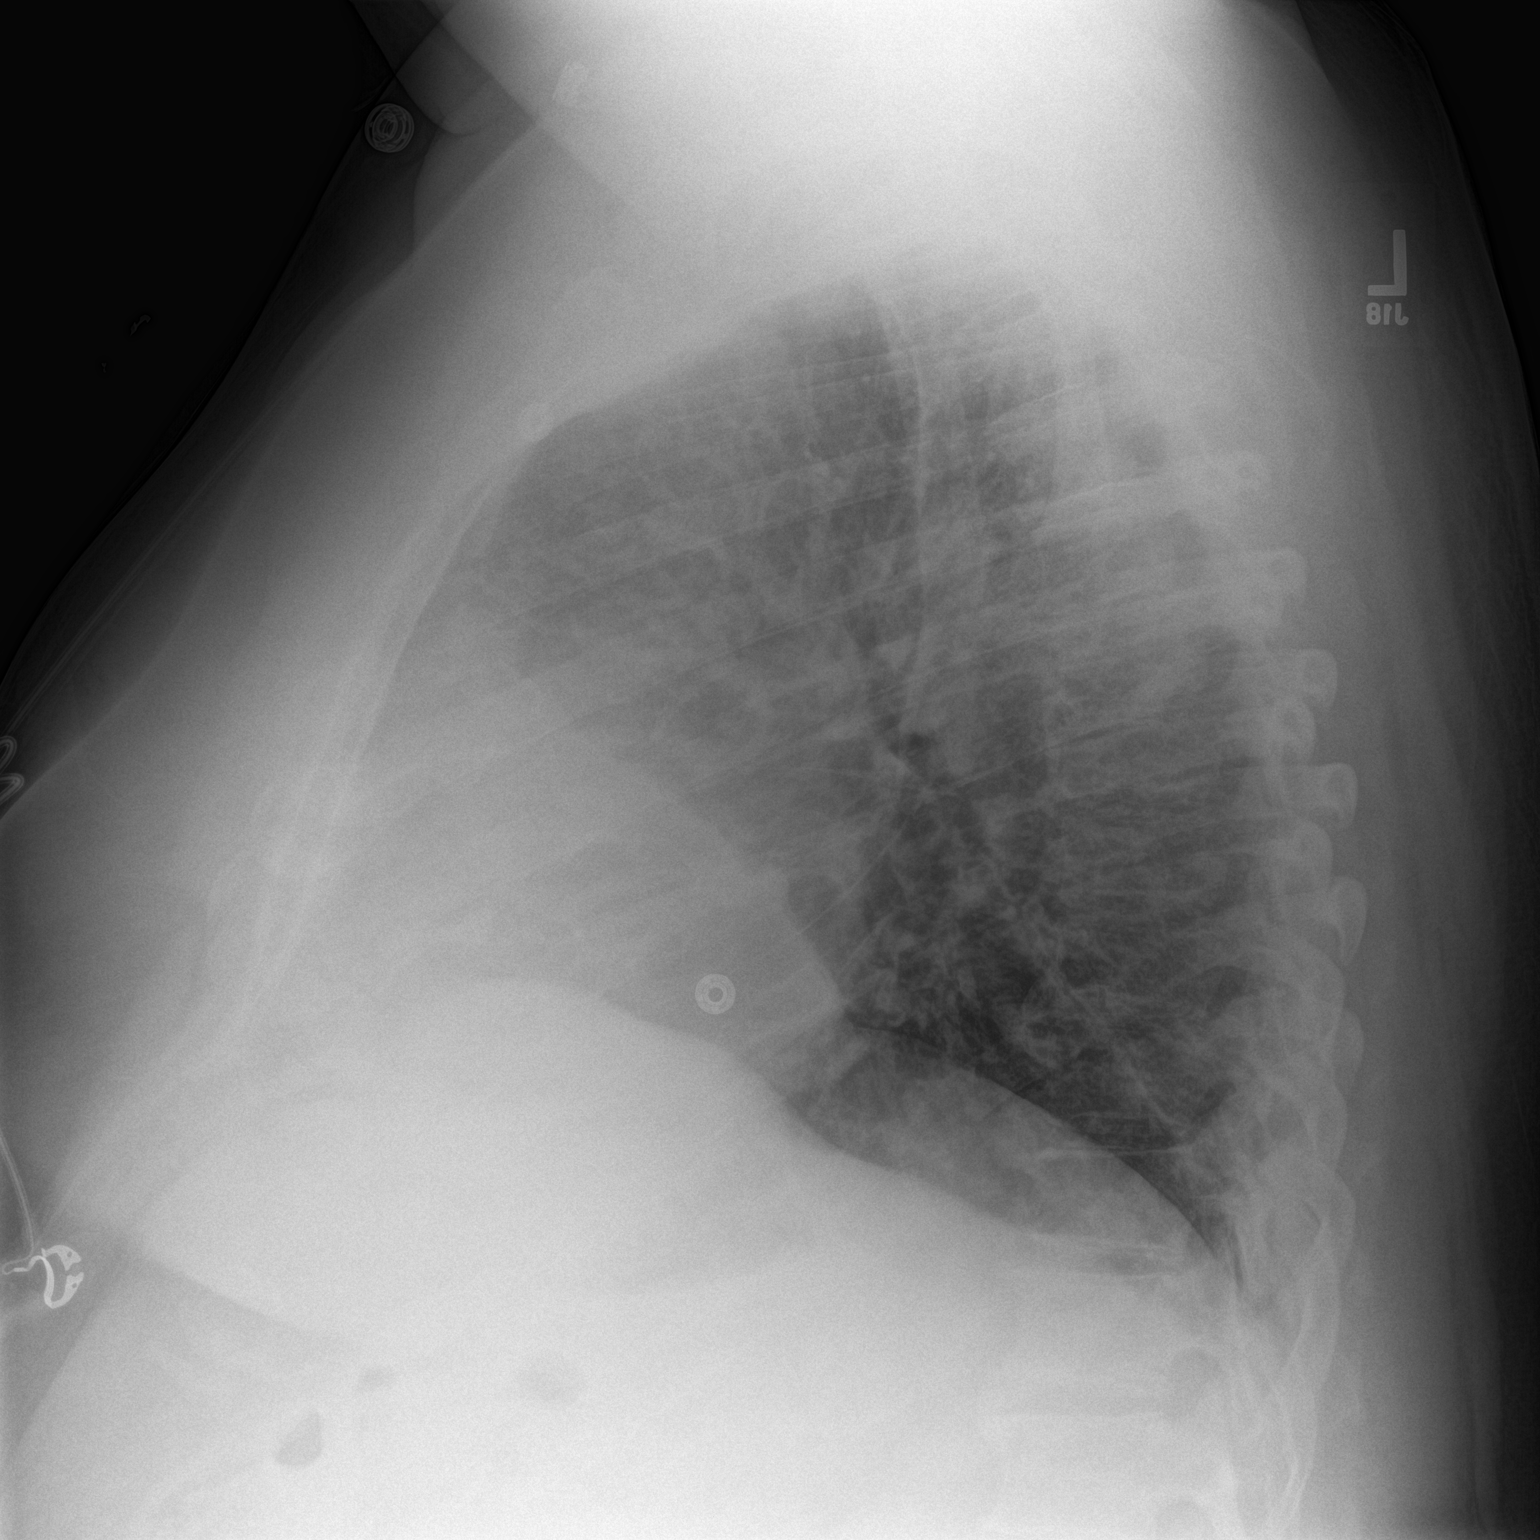

[2 of 2 positions shown; findings below may reference images not displayed]

FINDINGS: Mild peribronchial thickening and interstitial prominence likely
reflects bronchitic changes. Heart is normal size. No confluent
opacities or effusions. No acute bony abnormality.
IMPRESSION: Bronchitic changes.

## 2017-01-26 ENCOUNTER — Ambulatory Visit: Payer: Medicare Other | Admitting: Pulmonary Disease

## 2017-02-24 ENCOUNTER — Other Ambulatory Visit: Payer: Self-pay | Admitting: Adult Health

## 2017-02-24 MED ORDER — FLUTICASONE FUROATE-VILANTEROL 200-25 MCG/INH IN AEPB: 1.0000 | INHALATION_SPRAY | Freq: Every day | RESPIRATORY_TRACT | 3 refills | Status: DC

## 2017-07-05 ENCOUNTER — Other Ambulatory Visit: Payer: Self-pay | Admitting: Cardiology

## 2017-08-11 ENCOUNTER — Other Ambulatory Visit: Payer: Self-pay | Admitting: Cardiology

## 2017-08-14 NOTE — Progress Notes (Signed)
Cardiology Office Note  Date: 08/15/2017   ID: Jared CoteWilliam K [name], DOB 11-Dec-1960, MRN [id number]  PCP: Joette CatchingNyland, Leonard, MD  Primary Cardiologist: Nona DellSamuel Kierston Plasencia, MD   Chief Complaint  Patient presents with  . Coronary Artery Disease    History of Present Illness: Jared [name] is a 57 y.o. male last seen in September 2017.  He presents overdue for a follow-up visit.  From a cardiac perspective, he reports no angina symptoms or nitroglycerin use.  He states that he has been compliant with his medications.  He states that he continues to struggle with chronic back pain, complicated by morbid obesity.  He is using a walker.  He did not pursue bariatric surgery evaluation.  Current medications include aspirin, Lipitor, Lopressor, and as needed nitroglycerin.  I reviewed his interval lab work from October 2018.  I personally reviewed his ECG today which shows sinus rhythm with right bundle branch block and left anterior fascicular block.  Previous anterior infarct pattern.  Past Medical History:  Diagnosis Date  . Anxiety   . Arthritis   . Asthma   . Back pain   . CAD (coronary artery disease)    a. cath 03/13/15 - PTCA and DES of prox LAD; Elevated LVEDP  . COPD (chronic obstructive pulmonary disease) (HCC)   . Cough syncope   . Degenerative disc disease, cervical   . Degenerative disc disease, lumbar   . Emphysema   . GERD (gastroesophageal reflux disease)   . History of kidney stones   . History of skin cancer   . Hyperlipidemia   . OSA (obstructive sleep apnea)    CPAP  . RBBB (right bundle branch block with left anterior fascicular block)     Past Surgical History:  Procedure Laterality Date  . CARDIAC CATHETERIZATION N/A 03/13/2015   Procedure: Left Heart Cath and Coronary Angiography;  Surgeon: Corky CraftsJayadeep S Varanasi, MD;  [facility];  Service: Cardiovascular;  Laterality: N/A;  . CARDIAC CATHETERIZATION N/A 03/13/2015   Procedure: Coronary  Stent Intervention;  Surgeon: Corky CraftsJayadeep S Varanasi, MD;  [facility];  Service: Cardiovascular;  Laterality: N/A;  . Inguinal artery repair     nguinal  . KNEE ARTHROSCOPY Left 01/03/2013   Procedure: LEFT KNEE ARTHROSCOPY WITH DEBRIDEMENT/MICROFRACTURE/MEDIAL FEMORAL CHONDIAL/PARTIAL MEDIAL AND LATERAL MENISCECTOMY;  Surgeon: Javier DockerJeffrey C Beane, MD;  Location: [facility];  Service: Orthopedics;  Laterality: Left;  . Surgery for sleep apnea      Current Outpatient Medications  Medication Sig Dispense Refill  . albuterol (PROVENTIL HFA;VENTOLIN HFA) 108 (90 BASE) MCG/ACT inhaler Inhale 2 puffs into the lungs every 6 (six) hours as needed for wheezing or shortness of breath. 1 Inhaler 2  . albuterol (PROVENTIL) (2.5 MG/3ML) 0.083% nebulizer solution TAKE 3 MLS (2.5 MG TOTAL) BY NEBULIZATION EVERY 6 (SIX) HOURS AS NEEDED FOR WHEEZING. 75 mL 3  . aspirin 81 MG tablet Take 81 mg by mouth daily.    Marland Kitchen. atorvastatin (LIPITOR) 80 MG tablet TAKE 1 TABLET (80 MG TOTAL) BY MOUTH DAILY AT 6 PM. 30 tablet 3  . citalopram (CELEXA) 20 MG tablet Take 20 mg by mouth every morning.     . fenofibrate 160 MG tablet Take 1 tablet by mouth daily.     . fluticasone furoate-vilanterol (BREO ELLIPTA) 200-25 MCG/INH AEPB Inhale 1 puff into the lungs daily. 60 each 3  . Fluticasone-Umeclidin-Vilant (TRELEGY ELLIPTA) 100-62.5-25 MCG/INH AEPB Inhale 1 puff into the lungs daily. 60 each 5  .  metoprolol tartrate (LOPRESSOR) 25 MG tablet TAKE 0.5 TABLETS (12.5 MG TOTAL) BY MOUTH 2 (TWO) TIMES DAILY. 60 tablet 6  . metoprolol tartrate (LOPRESSOR) 25 MG tablet TAKE 1/2 TABLET BY MOUTH TWICE DAILY 15 tablet 0  . NITROSTAT 0.4 MG SL tablet PLACE 1 TABLET UNDER THE TONGUE EVERY 5 MINS AS NEEDED FOR CHEST PAIN.  0  . pantoprazole (PROTONIX) 40 MG tablet TAKE 1 TABLET (40 MG TOTAL) BY MOUTH DAILY. 30 tablet 3   No current facility-administered medications for this visit.    Allergies:  Patient has no known allergies.    Social History: The patient  reports that he quit smoking about 2 years ago. His smoking use included cigarettes. He started smoking about 45 years ago. He has a 80.00 pack-year smoking history. he has never used smokeless tobacco. He reports that he does not drink alcohol or use drugs.  ROS:  Please see the history of present illness. Otherwise, complete review of systems is positive for chronic back pain.  All other systems are reviewed and negative.   Physical Exam: VS:  BP 138/84   Pulse 86   Ht 5\' 9"  (1.753 m)   Wt (!) 454 lb (205.9 kg)   SpO2 94%   BMI 67.04 kg/m , BMI Body mass index is 67.04 kg/m.  Wt Readings from Last 3 Encounters:  08/15/17 (!) 454 lb (205.9 kg)  10/17/16 (!) 474 lb 3.2 oz (215.1 kg)  09/22/16 (!) 464 lb 12.8 oz (210.8 kg)    General: Morbidly obese male, using a walker. HEENT: Conjunctiva and lids normal, oropharynx clear. Neck: Supple, increased girth, no carotid bruits, no thyromegaly. Lungs: Clear to auscultation, nonlabored breathing at rest. Cardiac: Regular rate and rhythm, no S3 or significant systolic murmur, no pericardial rub. Abdomen: Obese with pannus, soft, nontender, bowel sounds present. Extremities: Chronic appearing lower leg edema and venous stasis, distal pulses 1+. Skin: Warm and dry. Musculoskeletal: No kyphosis. Neuropsychiatric: Alert and oriented x3, affect grossly appropriate.  ECG: I personally reviewed the tracing from 03/17/2016 which showed sinus rhythm with left atrial enlargement, right bundle branch block, nonspecific T wave changes.  Recent Labwork:  October 2018: Hemoglobin 13.8, platelets 269, BUN 13, creatinine 1.86, potassium 4.6, AST 37, ALT 41, hemoglobin A1c 5.6, cholesterol 152, triglycerides 164, HDL 34, LDL 85  Other Studies Reviewed Today:  Cardiac catheterization 03/13/2015:    Dominance: Right   Left Main  The vessel is angiographically normal.         Left Anterior Descending   . Prox  LAD lesion, 100% stenosed. thrombotic .   Marland Kitchen Thrombectomy:An aspiration thrombectomy was performed on the lesion. Thrombectomy was successful.  . Supplies used: BALLOON EMERGE MR [id number]  . WUJ:WJXBJ is no pre-interventional antegrade distal flow (TIMI 0). Pre-stent angioplasty was performed. A drug-eluting stent was placed. The strut is apposed. Post-stent angioplasty was performed. The post-interventional distal flow is normal (TIMI 3). The intervention was successful. The patient experienced the no reflow phenomenon in this vessel.  . Supplies used: BALLOON EMERGE MR 2.5X12; STENT SYNERGY DES 3.5X16; BALLOON [place] EUPHORA [id number]  . There is no residual stenosis post intervention.       Left Circumflex  The vessel is angiographically normal.     Right Coronary Artery  The vessel is angiographically normal.     Echocardiogram 03/14/2015: Study Conclusions  - Left ventricle: The cavity size was normal. Wall thickness was increased in a pattern of moderate to severe LVH. Systolic function was  normal. The estimated ejection fraction was in the range of 55% to 60%. Doppler parameters are consistent with abnormal left ventricular relaxation (grade 1 diastolic dysfunction). - Aortic valve: Mildly calcified annulus. Normal thickness leaflets. Valve area (VTI): 3.78 cm^2. Valve area (Vmax): 4.27 cm^2. - Mitral valve: Mildly calcified annulus. Normal thickness leaflets . - Technically difficult study. Echo contrast was used to enhance visualization.  Assessment and Plan:  1.  Symptomatically stable CAD status post PTCA and DES intervention to the LAD in September 2016.  He does not report angina symptoms on current medical regimen.  ECG reviewed.  Continue with observation for now.  2.  Morbid obesity, adversely affecting health.  I talked to him about reconsidering assessment for bariatric surgery.  3.  Mixed hyperlipidemia, continues on Lipitor.  Recent LDL  85.  4.  OSA on CPAP.  He also has COPD, considering switching from Pulmonary follow-up in [facility] to Dr. Juanetta Gosling here in town for convenience.  Current medicines were reviewed with the patient today.   Orders Placed This Encounter  Procedures  . EKG 12-Lead    Disposition: Follow-up in 1 year, sooner if needed.  Signed, Jonelle Sidle, MD, [facility] 08/15/2017 2:12 PM    [facility] at [facility] [address] [place], [place] [address] Phone: [phone]; Fax: [phone]

## 2017-08-15 ENCOUNTER — Encounter: Payer: Self-pay | Admitting: Cardiology

## 2017-08-15 ENCOUNTER — Ambulatory Visit (INDEPENDENT_AMBULATORY_CARE_PROVIDER_SITE_OTHER): Payer: Medicare Other | Admitting: Cardiology

## 2017-08-15 VITALS — BP 138/84 | HR 86 | Ht 69.0 in | Wt >= 6400 oz

## 2017-08-15 DIAGNOSIS — E782 Mixed hyperlipidemia: Secondary | ICD-10-CM

## 2017-08-15 DIAGNOSIS — I25119 Atherosclerotic heart disease of native coronary artery with unspecified angina pectoris: Secondary | ICD-10-CM

## 2017-08-15 DIAGNOSIS — Z9989 Dependence on other enabling machines and devices: Secondary | ICD-10-CM

## 2017-08-15 DIAGNOSIS — G4733 Obstructive sleep apnea (adult) (pediatric): Secondary | ICD-10-CM | POA: Diagnosis not present

## 2017-08-15 NOTE — Patient Instructions (Addendum)
Your physician wants you to follow-up in: 1 year with Dr.McDowell You will receive a reminder letter in the mail two months in advance. If you don't receive a letter, please call our office to schedule the follow-up appointment.    Your physician recommends that you continue on your current medications as directed. Please refer to the Current Medication list given to you today.    If you need a refill on your cardiac medications before your next appointment, please call your pharmacy.     No lab work or tests ordered today.      Thank you for choosing [facility] !        

## 2017-08-24 ENCOUNTER — Other Ambulatory Visit: Payer: Self-pay | Admitting: Cardiology

## 2017-08-29 ENCOUNTER — Encounter: Payer: Self-pay | Admitting: Internal Medicine

## 2017-10-19 ENCOUNTER — Telehealth: Payer: Self-pay

## 2017-10-19 NOTE — Telephone Encounter (Signed)
Pt left a VM stating he didn't know he had an appointment and needs to cancel his appointment. Pt said he would call back to r/s his appointment. [phone]

## 2017-10-20 ENCOUNTER — Ambulatory Visit: Payer: Medicare Other | Admitting: Gastroenterology

## 2017-11-30 ENCOUNTER — Other Ambulatory Visit: Payer: Self-pay | Admitting: Adult Health

## 2018-05-30 ENCOUNTER — Other Ambulatory Visit: Payer: Self-pay | Admitting: Cardiology

## 2018-06-05 ENCOUNTER — Telehealth: Payer: Self-pay | Admitting: Pulmonary Disease

## 2018-06-05 NOTE — Telephone Encounter (Signed)
ATC patient phone keep ringing unable to leave message patient will need apt before a machine can be order please have patient schedule apt.

## 2018-06-06 NOTE — Telephone Encounter (Signed)
ATC, NA 

## 2018-06-06 NOTE — Telephone Encounter (Signed)
FYI Dr. Sood 

## 2018-06-06 NOTE — Telephone Encounter (Signed)
Attempted to call pt but unable to reach him and unable to leave message.   If pt calls back, due to it being over a year since we have seen pt, pt will need to schedule an OV as this is going to be needed per insurance recs anyway for him to be able to receive a new CPAP.

## 2018-06-06 NOTE — Telephone Encounter (Signed)
Patient returned call, advised of notes that he would need an appointment.  Patient states he cannot come in due to pain and immobility.  Did not request a call back.

## 2018-06-06 NOTE — Telephone Encounter (Signed)
Pt is calling back [phone] 

## 2018-06-11 NOTE — Telephone Encounter (Signed)
Patient returned phone call; patient states he is not able to get around so it is hard to come in for appointment; currently getting injections in his back to manage the pain; pt states the pain is too bad for him to be mobile right; requesting a New CPAP ([facility]/ [employer]) because he using one that is close to about 59 years old; CPAP supplies (mask, etc...) is worn out; last order for CPAP was too much out of pocket; and now he believes his insurance will help with the cost; pt contact # [phone]

## 2018-06-11 NOTE — Telephone Encounter (Signed)
Called to see if we could schedule a appointment with Patient for new CPAP. Spoke with a man that said he would give the Patient a message.  Explained he could just return call to [facility].

## 2018-06-11 NOTE — Telephone Encounter (Signed)
Call made to patient, made aware that a in office face to face visit will be required for insurance purposes as well as for the physician to assess him since it has been over a year. Patient states that his pain is too bad right night and he would call back once he is more mobile and able to come in. Patient states his cpap is working but it is 59 years old. Voiced understanding. Nothing further is needed at this time.

## 2018-08-06 ENCOUNTER — Ambulatory Visit (INDEPENDENT_AMBULATORY_CARE_PROVIDER_SITE_OTHER): Payer: Medicare Other | Admitting: Acute Care

## 2018-08-06 ENCOUNTER — Encounter: Payer: Self-pay | Admitting: Acute Care

## 2018-08-06 VITALS — BP 102/58 | HR 67 | Ht 71.0 in | Wt >= 6400 oz

## 2018-08-06 DIAGNOSIS — G4733 Obstructive sleep apnea (adult) (pediatric): Secondary | ICD-10-CM | POA: Diagnosis not present

## 2018-08-06 DIAGNOSIS — J439 Emphysema, unspecified: Secondary | ICD-10-CM | POA: Diagnosis not present

## 2018-08-06 MED ORDER — FLUTICASONE FUROATE-VILANTEROL 200-25 MCG/INH IN AEPB: 1.0000 | INHALATION_SPRAY | Freq: Every day | RESPIRATORY_TRACT | 3 refills | Status: DC

## 2018-08-06 MED ORDER — ALBUTEROL SULFATE HFA 108 (90 BASE) MCG/ACT IN AERS: 2.0000 | INHALATION_SPRAY | Freq: Four times a day (QID) | RESPIRATORY_TRACT | 2 refills | Status: DC | PRN

## 2018-08-06 NOTE — Patient Instructions (Addendum)
It is good top see you today. We will call in a prescription for Breo 200. [place] this once one puff once daily. Rinse mouth after use. We will send in a prescription for albuterol rescue inhaler. Use this for breakthrough shortness of breath or wheezing up to 4 times a day. We will order a home sleep study to evaluate your sleep apnea. This will be necessary prior to getting a new CPAP device, and new equipment. We will call you with the results of the sleep study and the recommendations. If CPAP is recommended, we will place an order for a new machine.  Please establish care with [facility], as they make deliveries. Follow up appointment with Sarah or Dr. Craige Cotta 30-60 days after starting the new device. Please contact office for sooner follow up if symptoms do not improve or worsen or seek emergency care . Note your daily COPD symptoms > remember "red flags" for COPD:  Increase in cough, increase in sputum production, increase in shortness of breath with activity. If you notice these symptoms, please call to be seen.

## 2018-08-06 NOTE — Progress Notes (Signed)
History of Present Illness Jared [name] is a 58 y.o. male , morbidly obese, former smoker, with COPD and OSA on CPAP for many years. He is followed by Dr. Craige Cotta.   08/06/2018 Face to face visit for new CPAP machine. His machine is > 79 years old. Pt. Presents for follow up. He states he has to  been seen to order a new machine. He has been using an old machine that is about 60 years old. It does not have down  load capabilities. He states he continues to have snoring and he continues to awaken sleepy after a full night of rest. He awakens with head aches.He is managing his COPD. He is using Breo once daily. He would like to use Trelegy, but his insurance cost is prohibitive.We will provide him with paperwork for financial assistance.  He states his secretions are white.He denies any fever, chest pain, hemoptysis or orthopnea. He is using a mobile scooter due to chronic back pain.He is on Hydrocodone 5/325 mg , he usually takes 2-3 per day.   Overdose Risk Score is 250/999 NARX  Scores Narcotic>> 291 Sedative>>> 171 Stimulant>> 000  08/06/2018: Epworth Score is 14 today  Test Results: NPSG 2002: AHI 38/hr Cleda Daub 03/2012: Normal FEV1%, mild reduction in FVC most likely due to centripetal obesity.  Cleda Daub 10/2014: FEV1 1.52 (39%), ratio 55   CBC Latest Ref Rng & Units 03/17/2015 03/14/2015 03/13/2015  WBC 4.0 - 10.5 K/uL 9.4 16.9(H) 11.6(H)  Hemoglobin 13.0 - 17.0 g/dL 12.6(L) 15.0 14.5  Hematocrit 39.0 - 52.0 % 37.0(L) 44.8 42.6  Platelets 150 - 400 K/uL 222 242 256    BMP Latest Ref Rng & Units 03/25/2015 03/17/2015 03/16/2015  Glucose 65 - 99 mg/dL 811(B) 147(W) 295(A)  BUN 6 - 20 mg/dL 18 21(H) 08(M)  Creatinine 0.61 - 1.24 mg/dL 5.78(I) 6.96(E) 9.52(W)  Sodium 135 - 145 mmol/L 140 134(L) 136  Potassium 3.5 - 5.1 mmol/L 4.3 3.4(L) 3.6  Chloride 101 - 111 mmol/L 108 101 101  CO2 22 - 32 mmol/L 27 23 24   Calcium 8.9 - 10.3 mg/dL 4.1(L) 2.4(M) 8.9    BNP    Component Value  Date/Time   BNP 72.6 03/16/2015 0843    ProBNP No results found for: PROBNP  PFT No results found for: FEV1PRE, FEV1POST, FVCPRE, FVCPOST, TLC, DLCOUNC, PREFEV1FVCRT, PSTFEV1FVCRT  No results found.   Past medical hx Past Medical History:  Diagnosis Date  . Anxiety   . Arthritis   . Asthma   . Back pain   . CAD (coronary artery disease)    a. cath 03/13/15 - PTCA and DES of prox LAD; Elevated LVEDP  . COPD (chronic obstructive pulmonary disease) (HCC)   . Cough syncope   . Degenerative disc disease, cervical   . Degenerative disc disease, lumbar   . Emphysema   . GERD (gastroesophageal reflux disease)   . History of kidney stones   . History of skin cancer   . Hyperlipidemia   . OSA (obstructive sleep apnea)    CPAP  . RBBB (right bundle branch block with left anterior fascicular block)      Social History   Tobacco Use  . Smoking status: Former Smoker    Packs/day: 2.00    Years: 40.00    Pack years: 80.00    Types: Cigarettes    Start date: 03/08/1972    Last attempt to quit: 03/12/2015    Years since quitting: 3.4  . Smokeless tobacco:  Never Used  . Tobacco comment: started smoking at age 22.    Substance Use Topics  . Alcohol use: No    Alcohol/week: 0.0 standard drinks  . Drug use: No    Jared [name] reports that he quit smoking about 3 years ago. His smoking use included cigarettes. He started smoking about 46 years ago. He has a 80.00 pack-year smoking history. He has never used smokeless tobacco. He reports that he does not drink alcohol or use drugs.  Tobacco Cessation: Former smoker Quit 2016 with an 80 pack year smoking history.  Past surgical hx, Family hx, Social hx all reviewed.  Current Outpatient Medications on File Prior to Visit  Medication Sig  . albuterol (PROVENTIL) (2.5 MG/3ML) 0.083% nebulizer solution TAKE 3 MLS (2.5 MG TOTAL) BY NEBULIZATION EVERY 6 (SIX) HOURS AS NEEDED FOR WHEEZING.  Marland Kitchen aspirin 81 MG tablet Take 81 mg by mouth  daily.  Marland Kitchen atorvastatin (LIPITOR) 80 MG tablet TAKE 1 TABLET (80 MG TOTAL) BY MOUTH DAILY AT 6 PM.  . citalopram (CELEXA) 20 MG tablet Take 20 mg by mouth every morning.   . fenofibrate 160 MG tablet Take 1 tablet by mouth daily.   . Fluticasone-Umeclidin-Vilant (TRELEGY ELLIPTA) 100-62.5-25 MCG/INH AEPB Inhale 1 puff into the lungs daily.  Marland Kitchen HYDROcodone-acetaminophen (NORCO/VICODIN) 5-325 MG tablet Take by mouth.  . metoprolol tartrate (LOPRESSOR) 25 MG tablet TAKE 0.5 TABLETS (12.5 MG TOTAL) BY MOUTH 2 (TWO) TIMES DAILY.  . metoprolol tartrate (LOPRESSOR) 25 MG tablet TAKE 1/2 TABLET BY MOUTH TWICE DAILY  . NITROSTAT 0.4 MG SL tablet PLACE 1 TABLET UNDER THE TONGUE EVERY 5 MINS AS NEEDED FOR CHEST PAIN.  Marland Kitchen pantoprazole (PROTONIX) 40 MG tablet TAKE 1 TABLET (40 MG TOTAL) BY MOUTH DAILY.  Marland Kitchen pregabalin (LYRICA) 50 MG capsule    No current facility-administered medications on file prior to visit.      No Known Allergies  Review Of Systems:  Constitutional:   No  weight loss, night sweats,  Fevers, chills, + fatigue, or  lassitude.  HEENT:   + headaches,  Difficulty swallowing,  Tooth/dental problems, or  Sore throat,                No sneezing, itching, ear ache, nasal congestion, post nasal drip,   CV:  No chest pain,  Orthopnea, PND, Baseline swelling in lower extremities, No anasarca, dizziness, palpitations, syncope.   GI  No heartburn, indigestion, abdominal pain, nausea, vomiting, diarrhea, change in bowel habits, loss of appetite, bloody stools.   Resp: + baseline  shortness of breath with exertion or at rest.  + baseline  excess mucus, + baseline  productive cough,  No non-productive cough,  No coughing up of blood.  No change in color of mucus.  + baseline  wheezing.  No chest wall deformity  Skin: no rash or lesions.  GU: no dysuria, change in color of urine, no urgency or frequency.  No flank pain, no hematuria   MS:  + joint pain( chronic back pain)  or swelling.  +   decreased range of motion ( Using a scooter).  + back pain.  Psych:  No change in mood or affect. + depression or anxiety,.  No memory loss.   Vital Signs BP (!) 102/58 (BP Location: Left Arm, Cuff Size: Normal)   Pulse 67   Ht 5\' 11"  (1.803 m)   Wt (!) 441 lb 12.8 oz (200.4 kg)   SpO2 94%   BMI 61.62 kg/m  Physical Exam:  General- No distress,  A&Ox3, pleasant ENT: No sinus tenderness, TM clear, pale nasal mucosa, no oral exudate,no post nasal drip, no LAN Cardiac: S1, S2, regular rate and rhythm, no murmur Chest: No wheeze/ rales/ dullness; no accessory muscle use, no nasal flaring, no sternal retractions Abd.: Soft Non-tender, ND, BS +, obese Ext: No clubbing cyanosis, trace BLE edema, brisk refill per nailbeds Neuro:  normal strength, MAE x 4, A&O x 3, deconditioned, mobility per an electric scooter Skin: No rashes, no lesions, warm and dry Psych: normal mood and behavior   Assessment/Plan  COPD with emphysema Controlled on Breo Would like Trelegy, but insurance cost is too hogh. Plan: We will call in a prescription for Breo 200. [place] this once one puff once daily. Rinse mouth after use. We will send in a prescription for albuterol rescue inhaler. Use this for breakthrough shortness of breath or wheezing up to 4 times a day. Please contact office for sooner follow up if symptoms do not improve or worsen or seek emergency care . We will provide you with paperwork for assistance with cost of Trelegy Note your daily COPD symptoms > remember "red flags" for COPD:  Increase in cough, increase in sputum production, increase in shortness of breath with activity. If you notice these symptoms, please call to be seen.     Obstructive sleep apnea Using old machine without down load capability States he is compliant Epworth Score is 14  Takes Hydrocodone for chronic back pain Plan: We will order a home sleep study to evaluate your sleep apnea. This will be necessary prior  to getting a new CPAP device, and new equipment. We will call you with the results of the sleep study and the recommendations. If CPAP is recommended, we will place an order for a new machine.  Be careful with sedating medications as they can worsen sleep apnea. Continue on CPAP at bedtime until you get your new machine Goal is to wear for at least 6 hours each night for maximal clinical benefit. Continue to work on weight loss, as the link between excess weight  and sleep apnea is well established.  Do not drive if sleepy. Remember to clean mask, tubing, filter, and reservoir once weekly with soapy water.  Please establish care with [facility], as they make deliveries. Follow up appointment with Kathryne Ramella or Dr. Craige CottaSood 30-60 days after starting the new device. Please contact office for sooner follow up if symptoms do not improve or worsen or seek emergency care . Note your daily COPD symptoms > remember "red flags" for COPD:  Increase in cough, increase in sputum production, increase in shortness of breath with activity. If you notice these symptoms, please call to be seen.       Bevelyn NgoSarah F Anadia Helmes, NP 08/06/2018  11:49 AM

## 2018-08-06 NOTE — Assessment & Plan Note (Addendum)
Using old machine without down load capability States he is compliant Epworth Score is 14  Takes Hydrocodone for chronic back pain Plan: We will order a home sleep study to evaluate your sleep apnea. This will be necessary prior to getting a new CPAP device, and new equipment. We will call you with the results of the sleep study and the recommendations. If CPAP is recommended, we will place an order for a new machine.  Be careful with sedating medications as they can worsen sleep apnea. Continue on CPAP at bedtime until you get your new machine Goal is to wear for at least 6 hours each night for maximal clinical benefit. Continue to work on weight loss, as the link between excess weight  and sleep apnea is well established.  Do not drive if sleepy. Remember to clean mask, tubing, filter, and reservoir once weekly with soapy water.  Please establish care with [facility], as they make deliveries. Follow up appointment with Sarah or Dr. Craige Cotta 30-60 days after starting the new device. Please contact office for sooner follow up if symptoms do not improve or worsen or seek emergency care . Note your daily COPD symptoms > remember "red flags" for COPD:  Increase in cough, increase in sputum production, increase in shortness of breath with activity. If you notice these symptoms, please call to be seen.

## 2018-08-06 NOTE — Assessment & Plan Note (Signed)
Controlled on Breo Would like Trelegy, but insurance cost is too hogh. Plan: We will call in a prescription for Breo 200. [place] this once one puff once daily. Rinse mouth after use. We will send in a prescription for albuterol rescue inhaler. Use this for breakthrough shortness of breath or wheezing up to 4 times a day. Please contact office for sooner follow up if symptoms do not improve or worsen or seek emergency care . We will provide you with paperwork for assistance with cost of Trelegy Note your daily COPD symptoms > remember "red flags" for COPD:  Increase in cough, increase in sputum production, increase in shortness of breath with activity. If you notice these symptoms, please call to be seen.

## 2018-08-13 NOTE — Progress Notes (Signed)
Reviewed and agree with assessment/plan.   Josslynn Mentzer, MD [facility] 06/15/2016, 12:24 PM Pager:  [phone]  

## 2018-08-30 ENCOUNTER — Telehealth: Payer: Self-pay

## 2018-08-30 NOTE — Telephone Encounter (Signed)
I tried calling the patient back to schedule the HST. The number [phone] just rings and rings and then states mailbox full. I left another message on number [phone] asking him to call the office to schedule for next week

## 2018-08-30 NOTE — Telephone Encounter (Signed)
Received call from patient, stating he needs to schedule his sleep study.

## 2018-08-30 NOTE — Telephone Encounter (Signed)
Synetta Fail has pt signed out.  Made her aware of phone note.

## 2018-08-31 NOTE — Telephone Encounter (Signed)
Pt is returning call about sched HST. Cb is [phone].

## 2018-08-31 NOTE — Telephone Encounter (Signed)
I now have Jared [name] schedule to pick up HST machine on 09/03/18 @ 2:00pm and he is aware

## 2018-09-03 ENCOUNTER — Other Ambulatory Visit: Payer: Self-pay

## 2018-09-03 DIAGNOSIS — G4733 Obstructive sleep apnea (adult) (pediatric): Secondary | ICD-10-CM

## 2018-09-12 ENCOUNTER — Telehealth: Payer: Self-pay | Admitting: Pulmonary Disease

## 2018-09-12 DIAGNOSIS — G4733 Obstructive sleep apnea (adult) (pediatric): Secondary | ICD-10-CM

## 2018-09-12 NOTE — Telephone Encounter (Signed)
HST 09/03/18 >> AHI 48.5, SpO2 low 82%   Please let him know that the sleep study showed severe obstructive sleep apnea.  Please arrange for auto CPAP 5 to 15 cm H2O with heated humidity and mask of choice.  Please have ROV with me or NP in 2 to 3 months after getting CPAP.

## 2018-09-13 NOTE — Telephone Encounter (Signed)
Attempted to call patient today regarding results. I did not receive an answer at time of call. I have left a voicemail message for pt to return call. X1  

## 2018-09-17 NOTE — Telephone Encounter (Signed)
Advised pt of results. Pt understood and nothing further is needed.   CPAP ordered.  

## 2018-09-17 NOTE — Telephone Encounter (Signed)
Pt is calling back [phone]

## 2018-11-13 ENCOUNTER — Ambulatory Visit: Payer: Medicare Other | Admitting: Adult Health

## 2019-02-11 ENCOUNTER — Ambulatory Visit: Payer: Medicare Other | Admitting: Pulmonary Disease

## 2019-02-12 ENCOUNTER — Encounter: Payer: Self-pay | Admitting: Pulmonary Disease

## 2019-02-12 ENCOUNTER — Ambulatory Visit (INDEPENDENT_AMBULATORY_CARE_PROVIDER_SITE_OTHER): Payer: Medicare Other | Admitting: Pulmonary Disease

## 2019-02-12 ENCOUNTER — Other Ambulatory Visit: Payer: Self-pay

## 2019-02-12 VITALS — BP 142/68 | HR 64 | Ht 71.0 in | Wt >= 6400 oz

## 2019-02-12 DIAGNOSIS — J449 Chronic obstructive pulmonary disease, unspecified: Secondary | ICD-10-CM

## 2019-02-12 DIAGNOSIS — E669 Obesity, unspecified: Secondary | ICD-10-CM

## 2019-02-12 DIAGNOSIS — J4489 Other specified chronic obstructive pulmonary disease: Secondary | ICD-10-CM

## 2019-02-12 DIAGNOSIS — J432 Centrilobular emphysema: Secondary | ICD-10-CM | POA: Diagnosis not present

## 2019-02-12 DIAGNOSIS — G4733 Obstructive sleep apnea (adult) (pediatric): Secondary | ICD-10-CM

## 2019-02-12 DIAGNOSIS — G4730 Sleep apnea, unspecified: Secondary | ICD-10-CM | POA: Diagnosis not present

## 2019-02-12 DIAGNOSIS — Z2821 Immunization not carried out because of patient refusal: Secondary | ICD-10-CM

## 2019-02-12 MED ORDER — ALBUTEROL SULFATE (2.5 MG/3ML) 0.083% IN NEBU: INHALATION_SOLUTION | RESPIRATORY_TRACT | 3 refills | Status: DC

## 2019-02-12 MED ORDER — ALBUTEROL SULFATE HFA 108 (90 BASE) MCG/ACT IN AERS: 2.0000 | INHALATION_SPRAY | Freq: Four times a day (QID) | RESPIRATORY_TRACT | 3 refills | Status: AC | PRN

## 2019-02-12 MED ORDER — BREO ELLIPTA 200-25 MCG/INH IN AEPB: 1.0000 | INHALATION_SPRAY | Freq: Every day | RESPIRATORY_TRACT | 3 refills | Status: DC

## 2019-02-12 NOTE — Patient Instructions (Signed)
Think about getting flu shot  Follow up in 6 months

## 2019-02-12 NOTE — Progress Notes (Signed)
[facility]  Chief Complaint  Patient presents with  . Follow-up    pt states he is doing well, denies any complaints with cpap.      Constitutional:  BP (!) 142/68 (BP Location: Left Arm, Cuff Size: Normal)   Pulse 64   Ht 5\' 11"  (1.803 m)   Wt (!) 443 lb (200.9 kg)   SpO2 97%   BMI 61.79 kg/m   Past Medical History:  RBBB, HLD, Nephrolithiasis, GERD, DJD, CAD, OA, Anxiety  Brief Summary:  Jared [name] is a 58 y.o. male former smoker with COPD and obstructive sleep apnea.  He had home sleep study in March.  Found to have severe sleep apnea.  Got new CPAP machine.  Working well.  Has nasal cushion mask.  Not having sinus congestion, sore throat, dry mouth, or aerophagia.  Ran out of breo and albuterol.  Has been using leftover medication with nebulizer.  Getting more cough, and chest congestion.  Also gets intermittent wheeze.  Not having fever, chest pain, or hemoptysis.   He is leery about getting flu shot.  Physical Exam:   Appearance - well kempt   ENMT - clear nasal mucosa, midline nasal  septum, no oral exudates, no LAN, trachea midline  Respiratory - normal chest wall, normal respiratory effort, no accessory muscle use, no wheeze/rales  CV - s1s2 regular rate and rhythm, no murmurs, no peripheral edema, radial pulses symmetric  GI - soft, non tender, no masses  Lymph - no adenopathy noted in neck and axillary areas  MSK - uses a scooter  Ext - no cyanosis, clubbing, or joint inflammation noted  Skin - venous stasis changes  Neuro - normal strength, oriented x 3  Psych - normal mood and affect   Assessment/Plan:   COPD with asthma and emphysema. - refill breo and albuterol - as we go ahead will assess how much of his breathing issues are related to asthma versus emphysema, and then determine whether he needs to continue ICS  Obstructive sleep apnea. - he is compliant with CPAP and reports benefit -  continue auto CPAP  Obesity. - discussed importance of weight loss  Influenza vaccination. - reviewed pro's of getting flu shot - he will think about this   Patient Instructions  Think about getting flu shot  Follow up in 6 months    Chesley Mires, MD [place] Pager: [phone] 02/12/2019, 10:40 AM  Flow Sheet     Pulmonary tests:  Spirometry 11/06/14 >> FEV1 1.5 (39%), FEV1% 55  Chest imaging:  CT chest 04/14/15 >> mild centrilobular emphysema, fatty liver, atherosclerosis  Sleep tests:  PSG 10/13/00 >> AHI 38 HST 09/03/18 >> AHI 48.5, SpO2 low 82% Auto CPAP 01/12/19 to 02/10/19 >> used on 30 of 30 nights with average 8 hrs 22 min.  Average AHI 3.6 with median CPAP 10 and 95 th percentile CPAP 12 cm H2O  Cardiac tests:  Echo 03/14/15 >> EF 55 to 60%, severe LVH, grade 1 DD  Medications:   Allergies as of 02/12/2019   No Known Allergies     Medication List       Accurate as of February 12, 2019 10:40 AM. If you have any questions, ask your nurse or doctor.        STOP taking these medications   Fluticasone-Umeclidin-Vilant 100-62.5-25 MCG/INH Aepb Commonly known as: Trelegy Ellipta Stopped by: Chesley Mires, MD     TAKE these medications  albuterol (2.5 MG/3ML) 0.083% nebulizer solution Commonly known as: PROVENTIL TAKE 3 MLS (2.5 MG TOTAL) BY NEBULIZATION EVERY 6 (SIX) HOURS AS NEEDED FOR WHEEZING.   albuterol 108 (90 Base) MCG/ACT inhaler Commonly known as: VENTOLIN HFA Inhale 2 puffs into the lungs every 6 (six) hours as needed for wheezing or shortness of breath. Insurance preference   aspirin 81 MG tablet Take 81 mg by mouth daily.   atorvastatin 80 MG tablet Commonly known as: LIPITOR TAKE 1 TABLET (80 MG TOTAL) BY MOUTH DAILY AT 6 PM.   Breo Ellipta 200-25 MCG/INH Aepb Generic drug: fluticasone furoate-vilanterol Inhale 1 puff into the lungs daily.   citalopram 20 MG tablet Commonly known as: CELEXA Take 20 mg by  mouth every morning.   fenofibrate 160 MG tablet Take 1 tablet by mouth daily.   HYDROcodone-acetaminophen 5-325 MG tablet Commonly known as: NORCO/VICODIN Take 1 tablet by mouth 3 (three) times daily.   metoprolol tartrate 25 MG tablet Commonly known as: LOPRESSOR TAKE 0.5 TABLETS (12.5 MG TOTAL) BY MOUTH 2 (TWO) TIMES DAILY. What changed: Another medication with the same name was removed. Continue taking this medication, and follow the directions you see here. Changed by: Coralyn HellingVineet Mikia Delaluz, MD   Nitrostat 0.4 MG SL tablet Generic drug: nitroGLYCERIN PLACE 1 TABLET UNDER THE TONGUE EVERY 5 MINS AS NEEDED FOR CHEST PAIN.   pantoprazole 40 MG tablet Commonly known as: PROTONIX TAKE 1 TABLET (40 MG TOTAL) BY MOUTH DAILY.   pregabalin 50 MG capsule Commonly known as: LYRICA       Past Surgical History:  He  has a past surgical history that includes Surgery for sleep apnea; Inguinal artery repair; Knee arthroscopy (Left, 01/03/2013); Cardiac catheterization (N/A, 03/13/2015); and Cardiac catheterization (N/A, 03/13/2015).  Family History:  His family history includes Heart disease in his brother, father, and mother.  Social History:  He  reports that he quit smoking about 3 years ago. His smoking use included cigarettes. He started smoking about 46 years ago. He has a 80.00 pack-year smoking history. He has never used smokeless tobacco. He reports that he does not drink alcohol or use drugs.

## 2019-04-30 ENCOUNTER — Telehealth: Payer: Self-pay | Admitting: Cardiology

## 2019-04-30 NOTE — Telephone Encounter (Signed)
Virtual Visit Pre-Appointment Phone Call  "(Name), I am calling you today to discuss your upcoming appointment. We are currently trying to limit exposure to the virus that causes COVID-19 by seeing patients at home rather than in the office."  1. "What is the BEST phone number to call the day of the visit?" - include this in appointment notes  2. Do you have or have access to (through a family member/friend) a smartphone with video capability that we can use for your visit?" a. If yes - list this number in appt notes as cell (if different from BEST phone #) and list the appointment type as a VIDEO visit in appointment notes b. If no - list the appointment type as a PHONE visit in appointment notes  3. Confirm consent - "In the setting of the current Covid19 crisis, you are scheduled for a (phone or video) visit with your provider on (date) at (time).  Just as we do with many in-office visits, in order for you to participate in this visit, we must obtain consent.  If you'd like, I can send this to your mychart (if signed up) or email for you to review.  Otherwise, I can obtain your verbal consent now.  All virtual visits are billed to your insurance company just like a normal visit would be.  By agreeing to a virtual visit, we'd like you to understand that the technology does not allow for your provider to perform an examination, and thus may limit your provider's ability to fully assess your condition. If your provider identifies any concerns that need to be evaluated in person, we will make arrangements to do so.  Finally, though the technology is pretty good, we cannot assure that it will always work on either your or our end, and in the setting of a video visit, we may have to convert it to a phone-only visit.  In either situation, we cannot ensure that we have a secure connection.  Are you willing to proceed?" STAFF: Did the patient verbally acknowledge consent to telehealth visit? Document  YES/NO here: yes  4. Advise patient to be prepared - "Two hours prior to your appointment, go ahead and check your blood pressure, pulse, oxygen saturation, and your weight (if you have the equipment to check those) and write them all down. When your visit starts, your provider will ask you for this information. If you have an Apple Watch or Kardia device, please plan to have heart rate information ready on the day of your appointment. Please have a pen and paper handy nearby the day of the visit as well."  5. Give patient instructions for MyChart download to smartphone OR Doximity/Doxy.me as below if video visit (depending on what platform provider is using)  6. Inform patient they will receive a phone call 15 minutes prior to their appointment time (may be from unknown caller ID) so they should be prepared to answer    [name] has been deemed a candidate for a follow-up tele-health visit to limit community exposure during the Covid-19 pandemic. I spoke with the patient via phone to ensure availability of phone/video source, confirm preferred email & phone number, and discuss instructions and expectations.  I reminded Jared [name] to be prepared with any vital sign and/or heart rhythm information that could potentially be obtained via home monitoring, at the time of his visit. I reminded Jared [name] to expect a phone call prior to  his visit.  [name] 04/30/2019 10:45 AM   INSTRUCTIONS FOR DOWNLOADING THE MYCHART APP TO SMARTPHONE  - The patient must first make sure to have activated MyChart and know their login information - If Apple, go to [employer] and type in MyChart in the search bar and download the app. If Android, ask patient to go to [employer] and type in [place] in the search bar and download the app. The app is free but as with any other app downloads, their phone may require them to verify saved payment information or  Apple/Android password.  - The patient will need to then log into the app with their MyChart username and password, and select [facility] as their healthcare provider to link the account. When it is time for your visit, go to the MyChart app, find appointments, and click Begin Video Visit. Be sure to Select Allow for your device to access the Microphone and Camera for your visit. You will then be connected, and your provider will be with you shortly.  **If they have any issues connecting, or need assistance please contact MyChart service desk [phone])**  **If using a computer, in order to ensure the best quality for their visit they will need to use either of the following Internet Browsers: [employer] or Google Chrome**  IF USING DOXIMITY or DOXY.ME - The patient will receive a link just prior to their visit by text.     FULL LENGTH CONSENT FOR TELE-HEALTH VISIT   I hereby voluntarily request, consent and authorize [facility] and its employed or contracted physicians, physician assistants, nurse practitioners or other licensed health care professionals (the Practitioner), to provide me with telemedicine health care services (the Services") as deemed necessary by the treating Practitioner. I acknowledge and consent to receive the Services by the Practitioner via telemedicine. I understand that the telemedicine visit will involve communicating with the Practitioner through live audiovisual communication technology and the disclosure of certain medical information by electronic transmission. I acknowledge that I have been given the opportunity to request an in-person assessment or other available alternative prior to the telemedicine visit and am voluntarily participating in the telemedicine visit.  I understand that I have the right to withhold or withdraw my consent to the use of telemedicine in the course of my care at any time, without affecting my right to future care  or treatment, and that the Practitioner or I may terminate the telemedicine visit at any time. I understand that I have the right to inspect all information obtained and/or recorded in the course of the telemedicine visit and may receive copies of available information for a reasonable fee.  I understand that some of the potential risks of receiving the Services via telemedicine include:   Delay or interruption in medical evaluation due to technological equipment failure or disruption;  Information transmitted may not be sufficient (e.g. poor resolution of images) to allow for appropriate medical decision making by the Practitioner; and/or   In rare instances, security protocols could fail, causing a breach of personal health information.  Furthermore, I acknowledge that it is my responsibility to provide information about my medical history, conditions and care that is complete and accurate to the best of my ability. I acknowledge that Practitioner's advice, recommendations, and/or decision may be based on factors not within their control, such as incomplete or inaccurate data provided by me or distortions of diagnostic images or specimens that may result from electronic transmissions. I  understand that the practice of medicine is not an exact science and that Practitioner makes no warranties or guarantees regarding treatment outcomes. I acknowledge that I will receive a copy of this consent concurrently upon execution via email to the email address I last provided but may also request a printed copy by calling the office of [place].    I understand that my insurance will be billed for this visit.   I have read or had this consent read to me.  I understand the contents of this consent, which adequately explains the benefits and risks of the Services being provided via telemedicine.   I have been provided ample opportunity to ask questions regarding this consent and the Services and have had  my questions answered to my satisfaction.  I give my informed consent for the services to be provided through the use of telemedicine in my medical care  By participating in this telemedicine visit I agree to the above.

## 2019-04-30 NOTE — Progress Notes (Signed)
Virtual Visit via Telephone Note   This visit type was conducted due to national recommendations for restrictions regarding the COVID-19 Pandemic (e.g. social distancing) in an effort to limit this patient's exposure and mitigate transmission in our community.  Due to his co-morbid illnesses, this patient is at least at moderate risk for complications without adequate follow up.  This format is felt to be most appropriate for this patient at this time.  The patient did not have access to video technology/had technical difficulties with video requiring transitioning to audio format only (telephone).  All issues noted in this document were discussed and addressed.  No physical exam could be performed with this format.  Please refer to the patient's chart for his  consent to telehealth for [facility].   Date:  05/01/2019   ID:  Jared [name], DOB 1961/05/22, MRN [id number]  Patient Location: Home Provider Location: Office  PCP:  [name], NP  Cardiologist:  Nona Dell, MD Electrophysiologist:  None   Evaluation Performed:  Follow-Up Visit  Chief Complaint:  Cardiac follow-up  History of Present Illness:    Jared [name] is a 58 y.o. male seen in February 2019.  We spoke by phone today.  He tells me that he has had no significant angina symptoms or nitroglycerin use since last assessment, in fact needs a fresh bottle of nitroglycerin.  He has establish with new PCP following retirement of Dr. Lysbeth Galas.  I reviewed his lab work from August as outlined below.  I also reviewed his medications.  Current cardiac regimen includes aspirin, Lipitor, fenofibrate, Lopressor, and as needed nitroglycerin.  The patient does not have symptoms concerning for COVID-19 infection (fever, chills, cough, or new shortness of breath).    Past Medical History:  Diagnosis Date  . Anxiety   . Arthritis   . Asthma   . Back pain   . CAD (coronary artery disease)    a. cath 03/13/15 -  PTCA and DES of prox LAD; Elevated LVEDP  . COPD (chronic obstructive pulmonary disease) (HCC)   . Cough syncope   . Degenerative disc disease, cervical   . Degenerative disc disease, lumbar   . Emphysema   . GERD (gastroesophageal reflux disease)   . History of kidney stones   . History of skin cancer   . Hyperlipidemia   . OSA (obstructive sleep apnea)    CPAP  . RBBB (right bundle branch block with left anterior fascicular block)    Past Surgical History:  Procedure Laterality Date  . CARDIAC CATHETERIZATION N/A 03/13/2015   Procedure: Left Heart Cath and Coronary Angiography;  Surgeon: Corky Crafts, MD;  Location: [facility] INVASIVE CV LAB;  Service: Cardiovascular;  Laterality: N/A;  . CARDIAC CATHETERIZATION N/A 03/13/2015   Procedure: Coronary Stent Intervention;  Surgeon: Corky Crafts, MD;  Location: [facility] INVASIVE CV LAB;  Service: Cardiovascular;  Laterality: N/A;  . Inguinal artery repair     nguinal  . KNEE ARTHROSCOPY Left 01/03/2013   Procedure: LEFT KNEE ARTHROSCOPY WITH DEBRIDEMENT/MICROFRACTURE/MEDIAL FEMORAL CHONDIAL/PARTIAL MEDIAL AND LATERAL MENISCECTOMY;  Surgeon: Javier Docker, MD;  Location: [facility] ORS;  Service: Orthopedics;  Laterality: Left;  . Surgery for sleep apnea       Current Meds  Medication Sig  . albuterol (PROVENTIL) (2.5 MG/3ML) 0.083% nebulizer solution TAKE 3 MLS (2.5 MG TOTAL) BY NEBULIZATION EVERY 6 (SIX) HOURS AS NEEDED FOR WHEEZING.  Marland Kitchen albuterol (VENTOLIN HFA) 108 (90 Base) MCG/ACT inhaler Inhale 2 puffs into  the lungs every 6 (six) hours as needed for wheezing or shortness of breath. Insurance preference  . aspirin 81 MG tablet Take 81 mg by mouth daily.  Marland Kitchen atorvastatin (LIPITOR) 80 MG tablet TAKE 1 TABLET (80 MG TOTAL) BY MOUTH DAILY AT 6 PM.  . citalopram (CELEXA) 20 MG tablet Take 20 mg by mouth every morning.   . fenofibrate 160 MG tablet Take 1 tablet by mouth daily.   . fluticasone furoate-vilanterol (BREO ELLIPTA) 200-25 MCG/INH  AEPB Inhale 1 puff into the lungs daily.  Marland Kitchen HYDROcodone-acetaminophen (NORCO/VICODIN) 5-325 MG tablet Take 1 tablet by mouth 3 (three) times daily.  . metoprolol tartrate (LOPRESSOR) 25 MG tablet TAKE 0.5 TABLETS (12.5 MG TOTAL) BY MOUTH 2 (TWO) TIMES DAILY.  Marland Kitchen NITROSTAT 0.4 MG SL tablet PLACE 1 TABLET UNDER THE TONGUE EVERY 5 MINS AS NEEDED FOR CHEST PAIN.  Marland Kitchen pantoprazole (PROTONIX) 40 MG tablet TAKE 1 TABLET (40 MG TOTAL) BY MOUTH DAILY.  . [DISCONTINUED] NITROSTAT 0.4 MG SL tablet PLACE 1 TABLET UNDER THE TONGUE EVERY 5 MINS AS NEEDED FOR CHEST PAIN.  . [DISCONTINUED] pregabalin (LYRICA) 50 MG capsule      Allergies:   Patient has no known allergies.   Social History   Tobacco Use  . Smoking status: Former Smoker    Packs/day: 2.00    Years: 40.00    Pack years: 80.00    Types: Cigarettes    Start date: 03/08/1972    Quit date: 03/12/2015    Years since quitting: 4.1  . Smokeless tobacco: Never Used  . Tobacco comment: started smoking at age 29.    Substance Use Topics  . Alcohol use: No    Alcohol/week: 0.0 standard drinks  . Drug use: No     Family Hx: The patient's family history includes Heart disease in his brother, father, and mother.  ROS:   Please see the history of present illness. All other systems reviewed and are negative.   Prior CV studies:   The following studies were reviewed today:  Cardiac catheterization 03/13/2015:           Dominance: Right     Left Main  The vessel is angiographically normal.                Left Anterior Descending    . Prox LAD lesion, 100% stenosed. thrombotic .    Marland Kitchen Thrombectomy:An aspiration thrombectomy was performed on the lesion. Thrombectomy was successful.  . Supplies used: BALLOON EMERGE MR [id number]  . GGY:IRSWN is no pre-interventional antegrade distal flow (TIMI 0). Pre-stent angioplasty was performed. A drug-eluting stent was placed. The strut is apposed. Post-stent angioplasty was performed. The  post-interventional distal flow is normal (TIMI 3). The intervention was successful. The patient experienced the no reflow phenomenon in this vessel.  . Supplies used: BALLOON EMERGE MR 2.5X12; STENT SYNERGY DES 3.5X16; BALLOON [place]y [id number]  . There is no residual stenosis post intervention.       Left Circumflex  The vessel is angiographically normal.      Right Coronary Artery  The vessel is angiographically normal.      Echocardiogram 03/14/2015: Study Conclusions  - Left ventricle: The cavity size was normal. Wall thickness was increased in a pattern of moderate to severe LVH. Systolic function was normal. The estimated ejection fraction was in the range of 55% to 60%. Doppler parameters are consistent with abnormal left ventricular relaxation (grade 1 diastolic dysfunction). - Aortic valve: Mildly calcified annulus. Normal  thickness leaflets. Valve area (VTI): 3.78 cm^2. Valve area (Vmax): 4.27 cm^2. - Mitral valve: Mildly calcified annulus. Normal thickness leaflets . - Technically difficult study. Echo contrast was used to enhance visualization.  Labs/Other Tests and Data Reviewed:    EKG:  An ECG dated 08/15/2017 was personally reviewed today and demonstrated:  Sinus rhythm with right bundle branch block, left anterior fascicular block, prior anterior infarct pattern.  Recent Labs:  August 2020: Hemoglobin A1c 5.6%, BUN 19, creatinine 1.45, potassium 4.7, AST 30, ALT 26, cholesterol 180, triglycerides 145, HDL 45, LDL 106  Wt Readings from Last 3 Encounters:  05/01/19 (!) 450 lb (204.1 kg)  02/12/19 (!) 443 lb (200.9 kg)  08/06/18 (!) 441 lb 12.8 oz (200.4 kg)     Objective:    Vital Signs:  Ht 5\' 8"  (1.727 m)   Wt (!) 450 lb (204.1 kg)   BMI 68.42 kg/m    He did not have a way to check vital signs today. Patient spoke in full sentences, not short of breath. No audible wheezing or coughing. Speech pattern normal.  ASSESSMENT  & PLAN:    1.  CAD s/p PTCA and DES intervention to the LAD in 2016.  He does not report any active angina and we will continue with medical therapy and observation.  Refill provided for fresh bottle of nitroglycerin.  Continue aspirin, beta-blocker, and statin.  2.  Mixed hyperlipidemia, he is on high-dose Lipitor and fenofibrate.  LDL up to 106 at last check.  Diet and weight loss would be further beneficial.  3.  Morbid obesity with OSA on CPAP.  COVID-19 Education: The signs and symptoms of COVID-19 were discussed with the patient and how to seek care for testing (follow up with PCP or arrange E-visit).  The importance of social distancing was discussed today.  Time:   Today, I have spent 7 minutes with the patient with telehealth technology discussing the above problems.     Medication Adjustments/Labs and Tests Ordered: Current medicines are reviewed at length with the patient today.  Concerns regarding medicines are outlined above.   Tests Ordered: No orders of the defined types were placed in this encounter.   Medication Changes: Meds ordered this encounter  Medications  . NITROSTAT 0.4 MG SL tablet    Sig: PLACE 1 TABLET UNDER THE TONGUE EVERY 5 MINS AS NEEDED FOR CHEST PAIN.    Dispense:  25 tablet    Refill:  3    Follow Up:  In Person 6 months in the [place] office.  Signed, Nona DellSamuel Kambra Beachem, MD  05/01/2019 10:50 AM    [facility]

## 2019-05-01 ENCOUNTER — Telehealth (INDEPENDENT_AMBULATORY_CARE_PROVIDER_SITE_OTHER): Payer: Medicare Other | Admitting: Cardiology

## 2019-05-01 ENCOUNTER — Encounter: Payer: Self-pay | Admitting: Cardiology

## 2019-05-01 VITALS — Ht 68.0 in | Wt >= 6400 oz

## 2019-05-01 DIAGNOSIS — I25119 Atherosclerotic heart disease of native coronary artery with unspecified angina pectoris: Secondary | ICD-10-CM | POA: Diagnosis not present

## 2019-05-01 DIAGNOSIS — E782 Mixed hyperlipidemia: Secondary | ICD-10-CM | POA: Diagnosis not present

## 2019-05-01 DIAGNOSIS — G4733 Obstructive sleep apnea (adult) (pediatric): Secondary | ICD-10-CM

## 2019-05-01 MED ORDER — NITROSTAT 0.4 MG SL SUBL: SUBLINGUAL_TABLET | SUBLINGUAL | 3 refills | Status: DC

## 2019-05-01 NOTE — Patient Instructions (Signed)

## 2019-10-16 ENCOUNTER — Telehealth: Payer: Self-pay

## 2019-10-16 NOTE — Telephone Encounter (Signed)
  Patient Consent for Virtual Visit         Jared [name] has provided verbal consent on 10/16/2019 for a virtual visit (video or telephone).   CONSENT FOR VIRTUAL VISIT FOR:  Jared [name]  By participating in this virtual visit I agree to the following:  I hereby voluntarily request, consent and authorize [facility] and its employed or contracted physicians, physician assistants, nurse practitioners or other licensed health care professionals (the Practitioner), to provide me with telemedicine health care services (the "Services") as deemed necessary by the treating Practitioner. I acknowledge and consent to receive the Services by the Practitioner via telemedicine. I understand that the telemedicine visit will involve communicating with the Practitioner through live audiovisual communication technology and the disclosure of certain medical information by electronic transmission. I acknowledge that I have been given the opportunity to request an in-person assessment or other available alternative prior to the telemedicine visit and am voluntarily participating in the telemedicine visit.  I understand that I have the right to withhold or withdraw my consent to the use of telemedicine in the course of my care at any time, without affecting my right to future care or treatment, and that the Practitioner or I may terminate the telemedicine visit at any time. I understand that I have the right to inspect all information obtained and/or recorded in the course of the telemedicine visit and may receive copies of available information for a reasonable fee.  I understand that some of the potential risks of receiving the Services via telemedicine include:  Marland Kitchen Delay or interruption in medical evaluation due to technological equipment failure or disruption; . Information transmitted may not be sufficient (e.g. poor resolution of images) to allow for appropriate medical decision making by the  Practitioner; and/or  . In rare instances, security protocols could fail, causing a breach of personal health information.  Furthermore, I acknowledge that it is my responsibility to provide information about my medical history, conditions and care that is complete and accurate to the best of my ability. I acknowledge that Practitioner's advice, recommendations, and/or decision may be based on factors not within their control, such as incomplete or inaccurate data provided by me or distortions of diagnostic images or specimens that may result from electronic transmissions. I understand that the practice of medicine is not an exact science and that Practitioner makes no warranties or guarantees regarding treatment outcomes. I acknowledge that a copy of this consent can be made available to me via my patient portal [facility] MyChart), or I can request a printed copy by calling the office of [facility].    I understand that my insurance will be billed for this visit.   I have read or had this consent read to me. . I understand the contents of this consent, which adequately explains the benefits and risks of the Services being provided via telemedicine.  . I have been provided ample opportunity to ask questions regarding this consent and the Services and have had my questions answered to my satisfaction. . I give my informed consent for the services to be provided through the use of telemedicine in my medical care

## 2019-10-17 ENCOUNTER — Encounter: Payer: Self-pay | Admitting: Cardiology

## 2019-10-17 ENCOUNTER — Telehealth (INDEPENDENT_AMBULATORY_CARE_PROVIDER_SITE_OTHER): Payer: Medicare Other | Admitting: Cardiology

## 2019-10-17 VITALS — BP 125/82 | Ht 68.0 in | Wt >= 6400 oz

## 2019-10-17 DIAGNOSIS — E782 Mixed hyperlipidemia: Secondary | ICD-10-CM

## 2019-10-17 DIAGNOSIS — I25119 Atherosclerotic heart disease of native coronary artery with unspecified angina pectoris: Secondary | ICD-10-CM | POA: Diagnosis not present

## 2019-10-17 MED ORDER — NITROSTAT 0.4 MG SL SUBL: SUBLINGUAL_TABLET | SUBLINGUAL | 3 refills | Status: DC

## 2019-10-17 NOTE — Progress Notes (Signed)
Virtual Visit via Telephone Note   This visit type was conducted due to national recommendations for restrictions regarding the COVID-19 Pandemic (e.g. social distancing) in an effort to limit this patient's exposure and mitigate transmission in our community.  Due to his co-morbid illnesses, this patient is at least at moderate risk for complications without adequate follow up.  This format is felt to be most appropriate for this patient at this time.  The patient did not have access to video technology/had technical difficulties with video requiring transitioning to audio format only (telephone).  All issues noted in this document were discussed and addressed.  No physical exam could be performed with this format.  Please refer to the patient's chart for his  consent to telehealth for [facility].   The patient was identified using 2 identifiers.  Date:  10/17/2019   ID:  Jared [name], DOB Oct 26, 1960, MRN [id number]  Patient Location: Home Provider Location: Home  PCP:  [name], NP  Cardiologist:  Nona Dell, MD Electrophysiologist:  None   Evaluation Performed:  Follow-Up Visit  Chief Complaint:  Cardiac follow-up  History of Present Illness:    Jared [name] is a 59 y.o. male last assessed via telehealth encounter in November 2020.  We spoke by phone today.  He does not report any active angina symptoms at this time.  Continues to follow-up with PCP, and also has a nephrologist with CKD stage III.  I reviewed his medications which are outlined below.  Current cardiac regimen includes aspirin, Lipitor, Lopressor, and as needed nitroglycerin.  We plan to request his most recent lab work from PCP.  The patient does not have symptoms concerning for COVID-19 infection (fever, chills, cough, or new shortness of breath).    Past Medical History:  Diagnosis Date  . Anxiety   . Arthritis   . Asthma   . Back pain   . CAD (coronary artery disease)    a.  cath 03/13/15 - PTCA and DES of prox LAD; Elevated LVEDP  . COPD (chronic obstructive pulmonary disease) (HCC)   . Cough syncope   . Degenerative disc disease, cervical   . Degenerative disc disease, lumbar   . Emphysema   . GERD (gastroesophageal reflux disease)   . History of kidney stones   . History of skin cancer   . Hyperlipidemia   . OSA (obstructive sleep apnea)    CPAP  . RBBB (right bundle branch block with left anterior fascicular block)    Past Surgical History:  Procedure Laterality Date  . CARDIAC CATHETERIZATION N/A 03/13/2015   Procedure: Left Heart Cath and Coronary Angiography;  Surgeon: Corky Crafts, MD;  Location: [facility] INVASIVE CV LAB;  Service: Cardiovascular;  Laterality: N/A;  . CARDIAC CATHETERIZATION N/A 03/13/2015   Procedure: Coronary Stent Intervention;  Surgeon: Corky Crafts, MD;  Location: [facility] INVASIVE CV LAB;  Service: Cardiovascular;  Laterality: N/A;  . Inguinal artery repair     nguinal  . KNEE ARTHROSCOPY Left 01/03/2013   Procedure: LEFT KNEE ARTHROSCOPY WITH DEBRIDEMENT/MICROFRACTURE/MEDIAL FEMORAL CHONDIAL/PARTIAL MEDIAL AND LATERAL MENISCECTOMY;  Surgeon: Javier Docker, MD;  Location: [facility]S;  Service: Orthopedics;  Laterality: Left;  . Surgery for sleep apnea       Current Meds  Medication Sig  . albuterol (PROVENTIL) (2.5 MG/3ML) 0.083% nebulizer solution TAKE 3 MLS (2.5 MG TOTAL) BY NEBULIZATION EVERY 6 (SIX) HOURS AS NEEDED FOR WHEEZING.  Marland Kitchen albuterol (VENTOLIN HFA) 108 (90 Base) MCG/ACT inhaler  Inhale 2 puffs into the lungs every 6 (six) hours as needed for wheezing or shortness of breath. Insurance preference  . aspirin 81 MG tablet Take 81 mg by mouth daily.  Marland Kitchen atorvastatin (LIPITOR) 80 MG tablet TAKE 1 TABLET (80 MG TOTAL) BY MOUTH DAILY AT 6 PM.  . citalopram (CELEXA) 20 MG tablet Take 20 mg by mouth every morning.   . fluticasone furoate-vilanterol (BREO ELLIPTA) 200-25 MCG/INH AEPB Inhale 1 puff into the lungs daily.  Marland Kitchen  HYDROcodone-acetaminophen (NORCO/VICODIN) 5-325 MG tablet Take 1 tablet by mouth 3 (three) times daily.  . metoprolol tartrate (LOPRESSOR) 25 MG tablet TAKE 0.5 TABLETS (12.5 MG TOTAL) BY MOUTH 2 (TWO) TIMES DAILY.  Marland Kitchen NITROSTAT 0.4 MG SL tablet PLACE 1 TABLET UNDER THE TONGUE EVERY 5 MINS AS NEEDED FOR CHEST PAIN.  Marland Kitchen pantoprazole (PROTONIX) 40 MG tablet TAKE 1 TABLET (40 MG TOTAL) BY MOUTH DAILY.  . [DISCONTINUED] NITROSTAT 0.4 MG SL tablet PLACE 1 TABLET UNDER THE TONGUE EVERY 5 MINS AS NEEDED FOR CHEST PAIN.     Allergies:   Patient has no known allergies.   ROS:   No palpitations or syncope.  Prior CV studies:   The following studies were reviewed today:  Cardiac catheterization 03/13/2015:           Dominance: Right     Left Main  The vessel is angiographically normal.                Left Anterior Descending    . Prox LAD lesion, 100% stenosed. thrombotic .    Marland Kitchen Thrombectomy:An aspiration thrombectomy was performed on the lesion. Thrombectomy was successful.  . Supplies used: BALLOON EMERGE MR [id number]  . WIO:XBDZH is no pre-interventional antegrade distal flow (TIMI 0). Pre-stent angioplasty was performed. A drug-eluting stent was placed. The strut is apposed. Post-stent angioplasty was performed. The post-interventional distal flow is normal (TIMI 3). The intervention was successful. The patient experienced the no reflow phenomenon in this vessel.  . Supplies used: BALLOON EMERGE MR 2.5X12; STENT SYNERGY DES 3.5X16; BALLOON Cliff EUPHORA [id number]  . There is no residual stenosis post intervention.       Left Circumflex  The vessel is angiographically normal.      Right Coronary Artery  The vessel is angiographically normal.      Echocardiogram 03/14/2015: Study Conclusions  - Left ventricle: The cavity size was normal. Wall thickness was increased in a pattern of moderate to severe LVH. Systolic function was normal. The estimated  ejection fraction was in the range of 55% to 60%. Doppler parameters are consistent with abnormal left ventricular relaxation (grade 1 diastolic dysfunction). - Aortic valve: Mildly calcified annulus. Normal thickness leaflets. Valve area (VTI): 3.78 cm^2. Valve area (Vmax): 4.27 cm^2. - Mitral valve: Mildly calcified annulus. Normal thickness leaflets . - Technically difficult study. Echo contrast was used to enhance visualization.  Labs/Other Tests and Data Reviewed:    EKG:  An ECG dated 08/15/2017 was personally reviewed today and demonstrated:  Sinus rhythm with right bundle branch block, left anterior fascicular block, old anterior infarct pattern.  Recent Labs:  No interval lab work for review today.  Wt Readings from Last 3 Encounters:  10/17/19 (!) 453 lb (205.5 kg)  05/01/19 (!) 450 lb (204.1 kg)  02/12/19 (!) 443 lb (200.9 kg)     Objective:    Vital Signs:  BP 125/82   Ht 5\' 8"  (1.727 m)   Wt (!) 453 lb (205.5 kg)  BMI 68.88 kg/m    Patient spoke in full sentences, not short of breath. No audible wheezing or coughing.  ASSESSMENT & PLAN:    1.  CAD status post DES to the LAD in 2016.  He reports no active angina symptoms at this time.  Continue aspirin, Lopressor, and Lipitor.  Refill provided for fresh bottle of nitroglycerin.  2.  Mixed hyperlipidemia.  Requesting most recent lab work from PCP.  He remains on high-dose Lipitor.  No longer on fenofibrate.   Time:   Today, I have spent 6 minutes with the patient with telehealth technology discussing the above problems.     Medication Adjustments/Labs and Tests Ordered: Current medicines are reviewed at length with the patient today.  Concerns regarding medicines are outlined above.   Tests Ordered: No orders of the defined types were placed in this encounter.   Medication Changes: Meds ordered this encounter  Medications  . NITROSTAT 0.4 MG SL tablet    Sig: PLACE 1 TABLET UNDER THE  TONGUE EVERY 5 MINS AS NEEDED FOR CHEST PAIN.    Dispense:  25 tablet    Refill:  3    Follow Up:  In Person 6 months in the [place] office.  Signed, Rozann Lesches, MD  10/17/2019 10:39 AM    [place]

## 2019-10-17 NOTE — Patient Instructions (Signed)
Medication Instructions: Your physician recommends that you continue on your current medications as directed. Please refer to the Current Medication list given to you today.    I refilled your NTG  Labwork: None today  Procedures/Testing: None today  Follow-Up: 6 months office visit with Dr.McDowell  Any Additional Special Instructions Will Be Listed Below (If Applicable).     If you need a refill on your cardiac medications before your next appointment, please call your pharmacy.      Thank you for choosing [facility] !

## 2020-03-04 ENCOUNTER — Other Ambulatory Visit: Payer: Self-pay

## 2020-03-04 MED ORDER — BREO ELLIPTA 200-25 MCG/INH IN AEPB: 1.0000 | INHALATION_SPRAY | Freq: Every day | RESPIRATORY_TRACT | 3 refills | Status: DC

## 2020-03-29 ENCOUNTER — Inpatient Hospital Stay (HOSPITAL_COMMUNITY)
Admission: EM | Admit: 2020-03-29 | Discharge: 2020-04-02 | DRG: 177 | Disposition: A | Discharge status: Home / Self Care | Payer: Medicare Other | Attending: Family Medicine | Admitting: Family Medicine

## 2020-03-29 ENCOUNTER — Other Ambulatory Visit: Payer: Self-pay

## 2020-03-29 ENCOUNTER — Emergency Department (HOSPITAL_COMMUNITY): Payer: Medicare Other

## 2020-03-29 DIAGNOSIS — I252 Old myocardial infarction: Secondary | ICD-10-CM

## 2020-03-29 DIAGNOSIS — I130 Hypertensive heart and chronic kidney disease with heart failure and stage 1 through stage 4 chronic kidney disease, or unspecified chronic kidney disease: Secondary | ICD-10-CM | POA: Diagnosis present

## 2020-03-29 DIAGNOSIS — K219 Gastro-esophageal reflux disease without esophagitis: Secondary | ICD-10-CM | POA: Diagnosis present

## 2020-03-29 DIAGNOSIS — R0902 Hypoxemia: Secondary | ICD-10-CM

## 2020-03-29 DIAGNOSIS — J439 Emphysema, unspecified: Secondary | ICD-10-CM | POA: Diagnosis present

## 2020-03-29 DIAGNOSIS — I2510 Atherosclerotic heart disease of native coronary artery without angina pectoris: Secondary | ICD-10-CM | POA: Diagnosis present

## 2020-03-29 DIAGNOSIS — Z7982 Long term (current) use of aspirin: Secondary | ICD-10-CM | POA: Diagnosis not present

## 2020-03-29 DIAGNOSIS — Z6841 Body Mass Index (BMI) 40.0 and over, adult: Secondary | ICD-10-CM | POA: Diagnosis not present

## 2020-03-29 DIAGNOSIS — M1990 Unspecified osteoarthritis, unspecified site: Secondary | ICD-10-CM | POA: Diagnosis present

## 2020-03-29 DIAGNOSIS — U071 COVID-19: Principal | ICD-10-CM | POA: Diagnosis present

## 2020-03-29 DIAGNOSIS — J9601 Acute respiratory failure with hypoxia: Secondary | ICD-10-CM

## 2020-03-29 DIAGNOSIS — N1831 Chronic kidney disease, stage 3a: Secondary | ICD-10-CM | POA: Diagnosis present

## 2020-03-29 DIAGNOSIS — Z87442 Personal history of urinary calculi: Secondary | ICD-10-CM | POA: Diagnosis not present

## 2020-03-29 DIAGNOSIS — Z8249 Family history of ischemic heart disease and other diseases of the circulatory system: Secondary | ICD-10-CM

## 2020-03-29 DIAGNOSIS — R9431 Abnormal electrocardiogram [ECG] [EKG]: Secondary | ICD-10-CM

## 2020-03-29 DIAGNOSIS — Z85828 Personal history of other malignant neoplasm of skin: Secondary | ICD-10-CM | POA: Diagnosis not present

## 2020-03-29 DIAGNOSIS — Z87891 Personal history of nicotine dependence: Secondary | ICD-10-CM | POA: Diagnosis not present

## 2020-03-29 DIAGNOSIS — Z79899 Other long term (current) drug therapy: Secondary | ICD-10-CM

## 2020-03-29 DIAGNOSIS — E785 Hyperlipidemia, unspecified: Secondary | ICD-10-CM

## 2020-03-29 DIAGNOSIS — N183 Chronic kidney disease, stage 3 unspecified: Secondary | ICD-10-CM

## 2020-03-29 DIAGNOSIS — I509 Heart failure, unspecified: Secondary | ICD-10-CM | POA: Diagnosis present

## 2020-03-29 DIAGNOSIS — E871 Hypo-osmolality and hyponatremia: Secondary | ICD-10-CM | POA: Diagnosis present

## 2020-03-29 DIAGNOSIS — J1282 Pneumonia due to coronavirus disease 2019: Secondary | ICD-10-CM | POA: Diagnosis present

## 2020-03-29 DIAGNOSIS — E662 Morbid (severe) obesity with alveolar hypoventilation: Secondary | ICD-10-CM

## 2020-03-29 DIAGNOSIS — G4733 Obstructive sleep apnea (adult) (pediatric): Secondary | ICD-10-CM

## 2020-03-29 LAB — PROCALCITONIN: Procalcitonin: 0.16 ng/mL

## 2020-03-29 LAB — CBC
HCT: 39.8 % (ref 39.0–52.0)
Hemoglobin: 13.4 g/dL (ref 13.0–17.0)
MCH: 30 pg (ref 26.0–34.0)
MCHC: 33.7 g/dL (ref 30.0–36.0)
MCV: 89.2 fL (ref 80.0–100.0)
Platelets: 249 10*3/uL (ref 150–400)
RBC: 4.46 MIL/uL (ref 4.22–5.81)
RDW: 14.8 % (ref 11.5–15.5)
WBC: 7.1 10*3/uL (ref 4.0–10.5)
nRBC: 0 % (ref 0.0–0.2)

## 2020-03-29 LAB — BASIC METABOLIC PANEL
Anion gap: 11 (ref 5–15)
BUN: 13 mg/dL (ref 8–23)
CO2: 22 mmol/L (ref 22–32)
Calcium: 8.6 mg/dL — ABNORMAL LOW (ref 8.9–10.3)
Chloride: 101 mmol/L (ref 98–111)
Creatinine, Ser: 1.47 mg/dL — ABNORMAL HIGH (ref 0.61–1.24)
GFR, Estimated: 51 mL/min — ABNORMAL LOW (ref >60)
Glucose, Bld: 108 mg/dL — ABNORMAL HIGH (ref 70–99)
Potassium: 4.1 mmol/L (ref 3.5–5.1)
Sodium: 134 mmol/L — ABNORMAL LOW (ref 135–145)

## 2020-03-29 LAB — RESPIRATORY PANEL BY RT PCR (FLU A&B, COVID)
Influenza A by PCR: NEGATIVE
Influenza B by PCR: NEGATIVE
SARS Coronavirus 2 by RT PCR: POSITIVE — AB

## 2020-03-29 LAB — TRIGLYCERIDES: Triglycerides: 138 mg/dL (ref <150)

## 2020-03-29 LAB — FIBRINOGEN: Fibrinogen: 790 mg/dL — ABNORMAL HIGH (ref 210–475)

## 2020-03-29 LAB — D-DIMER, QUANTITATIVE: D-Dimer, Quant: 0.91 ug/mL-FEU — ABNORMAL HIGH (ref 0.00–0.50)

## 2020-03-29 LAB — FERRITIN: Ferritin: 434 ng/mL — ABNORMAL HIGH (ref 24–336)

## 2020-03-29 LAB — LACTATE DEHYDROGENASE: LDH: 384 U/L — ABNORMAL HIGH (ref 98–192)

## 2020-03-29 LAB — LACTIC ACID, PLASMA: Lactic Acid, Venous: 1.2 mmol/L (ref 0.5–1.9)

## 2020-03-29 LAB — C-REACTIVE PROTEIN: CRP: 19.1 mg/dL — ABNORMAL HIGH (ref <1.0)

## 2020-03-29 MED ORDER — HYDROCOD POLST-CPM POLST ER 10-8 MG/5ML PO SUER
5.0000 mL | Freq: Two times a day (BID) | ORAL | Status: DC | PRN
  Administered 2020-03-31 – 2020-04-02 (×4): 5 mL via ORAL
  Filled 2020-03-29 (×4): qty 5

## 2020-03-29 MED ORDER — ACETAMINOPHEN 500 MG PO TABS
1000.0000 mg | ORAL_TABLET | Freq: Once | ORAL | Status: AC
  Administered 2020-03-29: 1000 mg via ORAL
  Filled 2020-03-29: qty 2

## 2020-03-29 MED ORDER — DEXAMETHASONE SODIUM PHOSPHATE 10 MG/ML IJ SOLN
10.0000 mg | Freq: Once | INTRAMUSCULAR | Status: AC
  Administered 2020-03-29: 10 mg via INTRAVENOUS
  Filled 2020-03-29: qty 1

## 2020-03-29 MED ORDER — SODIUM CHLORIDE 0.9 % IV SOLN
100.0000 mg | INTRAVENOUS | Status: AC
  Administered 2020-03-29 – 2020-03-30 (×2): 100 mg via INTRAVENOUS
  Filled 2020-03-29 (×2): qty 20

## 2020-03-29 MED ORDER — METHYLPREDNISOLONE SODIUM SUCC 125 MG IJ SOLR
100.0000 mg | Freq: Two times a day (BID) | INTRAMUSCULAR | Status: AC
  Administered 2020-03-29 – 2020-04-01 (×6): 100 mg via INTRAVENOUS
  Filled 2020-03-29 (×6): qty 2

## 2020-03-29 MED ORDER — GUAIFENESIN-DM 100-10 MG/5ML PO SYRP
10.0000 mL | ORAL_SOLUTION | ORAL | Status: DC | PRN
  Administered 2020-03-31 (×2): 10 mL via ORAL
  Filled 2020-03-29 (×2): qty 10

## 2020-03-29 MED ORDER — SODIUM CHLORIDE 0.9 % IV SOLN
100.0000 mg | Freq: Every day | INTRAVENOUS | Status: AC
  Administered 2020-03-30 – 2020-04-02 (×4): 100 mg via INTRAVENOUS
  Filled 2020-03-29 (×4): qty 20

## 2020-03-29 MED ORDER — ALBUTEROL SULFATE HFA 108 (90 BASE) MCG/ACT IN AERS
2.0000 | INHALATION_SPRAY | Freq: Four times a day (QID) | RESPIRATORY_TRACT | Status: DC
  Administered 2020-03-29 – 2020-03-30 (×5): 2 via RESPIRATORY_TRACT
  Filled 2020-03-29 (×2): qty 6.7

## 2020-03-29 MED ORDER — HEPARIN SODIUM (PORCINE) 5000 UNIT/ML IJ SOLN
5000.0000 [IU] | Freq: Three times a day (TID) | INTRAMUSCULAR | Status: DC
  Administered 2020-03-29 – 2020-04-02 (×11): 5000 [IU] via SUBCUTANEOUS
  Filled 2020-03-29 (×12): qty 1

## 2020-03-29 MED ORDER — PREDNISONE 20 MG PO TABS
50.0000 mg | ORAL_TABLET | Freq: Every day | ORAL | Status: DC
  Administered 2020-04-02: 50 mg via ORAL
  Filled 2020-03-29: qty 1

## 2020-03-29 MED ORDER — ACETAMINOPHEN 325 MG PO TABS: 650.0000 mg | ORAL_TABLET | Freq: Four times a day (QID) | ORAL | Status: DC | PRN

## 2020-03-29 MED ORDER — ZINC SULFATE 220 (50 ZN) MG PO CAPS
220.0000 mg | ORAL_CAPSULE | Freq: Every day | ORAL | Status: DC
  Administered 2020-03-30 – 2020-04-02 (×4): 220 mg via ORAL
  Filled 2020-03-29 (×4): qty 1

## 2020-03-29 MED ORDER — ASCORBIC ACID 500 MG PO TABS
500.0000 mg | ORAL_TABLET | Freq: Every day | ORAL | Status: DC
  Administered 2020-03-30 – 2020-04-02 (×4): 500 mg via ORAL
  Filled 2020-03-29 (×4): qty 1

## 2020-03-29 NOTE — H&P (Signed)
History and Physical  Jared [name] DDU:[id number] DOB: 02-14-1961 DOA: 03/29/2020  Referring physician: Placido Sou, PA-C PCP: [name], NP  Patient coming from:  Home  Chief Complaint:   HPI: Jared [name] is a 59 y.o. male with medical history significant for anxiety, COPD, CAD, GERD, OSA, hypertension, CHF, NSTEMI who presents to the the emergency department due to 2-week onset of flulike symptoms including cough with occasional production of white mucus, sore throat, headaches, body aches and generalized weakness with loss of taste and smell and decreased appetite.  He states that he had contact with 2 family members with COVID-19 positive prior to onset of his symptoms.  He noted shortness of breath which has worsened within last couple of days and was associated with generalized weakness and fatigue, this was associated with lightheadedness on ambulation.  He checked his O2 sat at home due to his symptoms and noted it to be 88%, so he decided to go to the ED for further evaluation and management.  ED Course:  In the emergency department, he was tachypneic and was placed on supplemental oxygen at 4 PM with O2 sat ranging from 92-98%.  Work-up in the ED showed normal CBC, mild hyponatremia, creatinine at 1.47 (last lab for comparison was 5 years ago with creatinine at 1.8).  Lactic acid and procalcitonin were normal, inflammatory markers (LDH, ferritin, CRP, D-dimer, fibrinogen) were elevated.  Chest x-ray shows bilateral pneumonia.  Tylenol was given and patient was treated with IV Decadron, Solu-Medrol was started.  Hospitalist was asked to admit patient for further evaluation management.  Review of Systems:  Constitutional: Negative for chills and fever.  HENT: Negative for ear pain and sore throat.   Eyes: Negative for pain and visual disturbance.  Respiratory: Positive for cough and shortness of breath.   Cardiovascular: Negative for chest pain and palpitations.    Gastrointestinal: Negative for abdominal pain and vomiting.  Endocrine: Negative for polyphagia and polyuria.  Genitourinary: Negative for decreased urine volume, dysuria Musculoskeletal: Negative for arthralgias and back pain.  Skin: Negative for color change and rash.  Allergic/Immunologic: Negative for immunocompromised state.  Neurological: Negative for tremors, syncope, speech difficulty, weakness, light-headedness and headaches.  Hematological: Does not bruise/bleed easily.  All other systems reviewed and are negative   Past Medical History:  Diagnosis Date  . Anxiety   . Arthritis   . Asthma   . Back pain   . CAD (coronary artery disease)    a. cath 03/13/15 - PTCA and DES of prox LAD; Elevated LVEDP  . COPD (chronic obstructive pulmonary disease) (HCC)   . Cough syncope   . Degenerative disc disease, cervical   . Degenerative disc disease, lumbar   . Emphysema   . GERD (gastroesophageal reflux disease)   . History of kidney stones   . History of skin cancer   . Hyperlipidemia   . OSA (obstructive sleep apnea)    CPAP  . RBBB (right bundle branch block with left anterior fascicular block)    Past Surgical History:  Procedure Laterality Date  . CARDIAC CATHETERIZATION N/A 03/13/2015   Procedure: Left Heart Cath and Coronary Angiography;  Surgeon: Corky Crafts, MD;  Location: [facility] INVASIVE CV LAB;  Service: Cardiovascular;  Laterality: N/A;  . CARDIAC CATHETERIZATION N/A 03/13/2015   Procedure: Coronary Stent Intervention;  Surgeon: Corky Crafts, MD;  Location[facility] INVASIVE CV LAB;  Service: Cardiovascular;  Laterality: N/A;  . Inguinal artery repair     nguinal  .  KNEE ARTHROSCOPY Left 01/03/2013   Procedure: LEFT KNEE ARTHROSCOPY WITH DEBRIDEMENT/MICROFRACTURE/MEDIAL FEMORAL CHONDIAL/PARTIAL MEDIAL AND LATERAL MENISCECTOMY;  Surgeon: Javier DockerJeffrey C Beane, MD;  Location: [facility];  Service: Orthopedics;  Laterality: Left;  . Surgery for sleep apnea      Social  History:  reports that he quit smoking about 5 years ago. His smoking use included cigarettes. He started smoking about 48 years ago. He has a 80.00 pack-year smoking history. He has never used smokeless tobacco. He reports that he does not drink alcohol and does not use drugs.   No Known Allergies  Family History  Problem Relation Age of Onset  . Heart disease Mother   . Heart disease Father   . Heart disease Brother        CABG in his 6740's    Prior to Admission medications   Medication Sig Start Date End Date Taking? Authorizing Provider  albuterol (VENTOLIN HFA) 108 (90 Base) MCG/ACT inhaler Inhale 2 puffs into the lungs every 6 (six) hours as needed for wheezing or shortness of breath. Insurance preference 02/12/19  Yes Coralyn HellingSood, Vineet, MD  aspirin 81 MG tablet Take 81 mg by mouth daily. 01/03/13  Yes Jene EveryBeane, Jeffrey, MD  atorvastatin (LIPITOR) 80 MG tablet TAKE 1 TABLET (80 MG TOTAL) BY MOUTH DAILY AT 6 PM. 03/07/16  Yes Jonelle SidleMcDowell, Samuel G, MD  citalopram (CELEXA) 20 MG tablet Take 20 mg by mouth every morning.    Yes [provider]  fluticasone furoate-vilanterol (BREO ELLIPTA) 200-25 MCG/INH AEPB Inhale 1 puff into the lungs daily. 03/04/20  Yes Coralyn HellingSood, Vineet, MD  HYDROcodone-acetaminophen (NORCO/VICODIN) 5-325 MG tablet Take 1 tablet by mouth 3 (three) times daily. prn   Yes [provider]  metoprolol tartrate (LOPRESSOR) 25 MG tablet TAKE 0.5 TABLETS (12.5 MG TOTAL) BY MOUTH 2 (TWO) TIMES DAILY. 05/03/16  Yes Jonelle SidleMcDowell, Samuel G, MD  NITROSTAT 0.4 MG SL tablet PLACE 1 TABLET UNDER THE TONGUE EVERY 5 MINS AS NEEDED FOR CHEST PAIN. 10/17/19  Yes Jonelle SidleMcDowell, Samuel G, MD  pantoprazole (PROTONIX) 40 MG tablet TAKE 1 TABLET (40 MG TOTAL) BY MOUTH DAILY. 03/07/16  Yes Jonelle SidleMcDowell, Samuel G, MD  Pseudoephedrine-APAP-DM (DAYQUIL MULTI-SYMPTOM COLD/FLU PO) Take 1 tablet by mouth every 6 (six) hours as needed.   Yes [provider]    Physical Exam: BP 137/86   Pulse 78   Temp  98.7 F (37.1 C) (Oral)   Resp (!) 22   SpO2 93%   . General: 59 y.o. year-old male obese and in no acute distress.  Alert and oriented x3.  Marland Kitchen. HEENT: NCAT, EOMI . Neck: Supple, trachea medial . Cardiovascular: Regular rate and rhythm with no rubs or gallops.  No thyromegaly or JVD noted. 2/4 pulses in all 4 extremities. Marland Kitchen. Respiratory: Tachypnea.  Clear to auscultation with no wheezes or rales. Good inspiratory effort. . Abdomen: Soft nontender nondistended with normal bowel sounds x4 quadrants. . Muskuloskeletal: No cyanosis, clubbing or edema noted bilaterally . Neuro: CN II-XII intact, strength, sensation, reflexes . Skin: No ulcerative lesions noted or rashes . Psychiatry: Judgement and insight appear normal. Mood is appropriate for condition and setting          Labs on Admission:  Basic Metabolic Panel: Recent Labs  Lab 03/29/20 1315  NA 134*  K 4.1  CL 101  CO2 22  GLUCOSE 108*  BUN 13  CREATININE 1.47*  CALCIUM 8.6*   Liver Function Tests: No results for input(s): AST, ALT, ALKPHOS, BILITOT, PROT,  ALBUMIN in the last 168 hours. No results for input(s): LIPASE, AMYLASE in the last 168 hours. No results for input(s): AMMONIA in the last 168 hours. CBC: Recent Labs  Lab 03/29/20 1315  WBC 7.1  HGB 13.4  HCT 39.8  MCV 89.2  PLT 249   Cardiac Enzymes: No results for input(s): CKTOTAL, CKMB, CKMBINDEX, TROPONINI in the last 168 hours.  BNP (last 3 results) No results for input(s): BNP in the last 8760 hours.  ProBNP (last 3 results) No results for input(s): PROBNP in the last 8760 hours.  CBG: No results for input(s): GLUCAP in the last 168 hours.  Radiological Exams on Admission: DG Chest Portable 1 View  Result Date: 03/29/2020 CLINICAL DATA:  Weakness and headaches. EXAM: PORTABLE CHEST 1 VIEW COMPARISON:  May 05, 2016 FINDINGS: The mediastinal contour and cardiac silhouette are normal. Patchy consolidations of the right upper lobe, left mid  lung, right lung base are identified. Small left pleural effusion is noted. Bony structures are normal. IMPRESSION: Bilateral pneumonias. Electronically Signed   By: Sherian Rein M.D.   On: 03/29/2020 14:24    EKG: I independently viewed the EKG done and my findings are as followed: Sinus tachycardia at rate of 104 bpm with QTc at 541  Assessment/Plan Present on Admission: . Pneumonia due to COVID-19 virus . Obstructive sleep apnea . GERD . Morbid obesity (HCC) . Dyslipidemia  Active Problems:   Obstructive sleep apnea   GERD   Dyslipidemia   Morbid obesity (HCC)   Pneumonia due to COVID-19 virus   Acute respiratory failure with hypoxia (HCC)   Prolonged QT interval   CKD (chronic kidney disease) stage 3, GFR 30-59 ml/min (HCC)    Acute respiratory failure with hypoxia secondary to COVID-19 virus pneumonia Continue albuterol q.6h Continue IV Solu-Medrol per pharmacy dosing Continue IV Remdesivir per pharmacy protocol Continue vitamin-C 500 mg p.o. Daily Continue zinc 220 mg p.o. Daily Continue Mucinex, Robitussin and Tussionex Continue Tylenol p.r.n. for fever Continue supplemental oxygen to maintain O2 sat > or = 94% with plan to wean patient off supplemental oxygen as tolerated (of note, patient does not use oxygen at baseline) Continue incentive spirometry and flutter valve q64min as tolerated Encourage proning, early ambulation, and side laying as tolerated Continue airborne isolation precaution Inflammatory markers: LDH:  384 CRP:  19.1 D-dimer: 0.91 (CT chest will be done in the morning to rule out concomitant PE) Ferritin: 434 Fibrinogen 790 Continue monitoring daily inflammatory markers Physician PPE:  Surgical mask with face shield, N-95, nonsterile gloves, disposable gown, head and shoe cover s Patient PPE:  Face mask   Prolonged QTc QTc 541 Avoid QT prolonging drugs Magnesium level will be checked  CKD stage IIIA Creatinine at 1.47 (last lab for  comparison was 5 years ago with creatinine at 1.8). Renally adjust medications, avoid nephrotoxic agents/dehydration/hypotension  Obstructive sleep apnea on CPAP  Continue CPAP  Essential hypertension Continue Lopressor per home regimen  Hyperlipidemia  Continue Lipitor  GERD Continue Protonix   Morbid obesity Patient will be counseled on diet and exercise when more stable.   DVT prophylaxis: Heparin subcu  Code Status: Full code  Family Communication: None at bedside  Disposition Plan:  Patient is from:                        home Anticipated DC to:                   SNF  or family members home Anticipated DC date:               2-3 days Anticipated DC barriers:          Unstable to be discharged at this time due to acute respiratory failure secondary to COVID-19 virus pneumonia requiring supplemental oxygen and inpatient management.   Consults called: None  Admission status: Inpatient    Frankey Shown MD [facility] Password [facility]  03/30/2020, 1:30 AM

## 2020-03-29 NOTE — ED Triage Notes (Signed)
Pt c/o of weakness, headache and poor appetite. Pt lives in house with two covid + patients.

## 2020-03-29 NOTE — ED Provider Notes (Addendum)
[facility] EMERGENCY DEPARTMENT Provider Note   CSN: [id number] Arrival date & time: 03/29/20  1206     History Chief Complaint  Patient presents with  . Weakness    Jared [name] is a 59 y.o. male.  HPI Patient is a 59 year old male with a history of anxiety, COPD, CAD, GERD, OSA, hypertension, CHF, NSTEMI, who presents to the emergency department with multiple complaints.  Patient states about 2 weeks ago he started experiencing cough, rhinorrhea, headaches, sore throat, loss of taste.  He has not been vaccinated for COVID-19.  He states that he was around 2 relatives who are positive for COVID-19 prior to the onset of his symptoms.  Over the past few days he feels as if his shortness of breath, fatigue, weakness, has continued to worsen.  He states that he becomes lightheaded when attempting to walk to the bathroom.  Patient confirms his history of COPD and denies any current smoking.  He does not have an oxygen requirement at home.  He states that he noticed his oxygen saturations were 88% on room air while at home, so he came to the emergency department for further evaluation.  He denies chest pain, abdominal pain, constipation, syncope.    Past Medical History:  Diagnosis Date  . Anxiety   . Arthritis   . Asthma   . Back pain   . CAD (coronary artery disease)    a. cath 03/13/15 - PTCA and DES of prox LAD; Elevated LVEDP  . COPD (chronic obstructive pulmonary disease) (HCC)   . Cough syncope   . Degenerative disc disease, cervical   . Degenerative disc disease, lumbar   . Emphysema   . GERD (gastroesophageal reflux disease)   . History of kidney stones   . History of skin cancer   . Hyperlipidemia   . OSA (obstructive sleep apnea)    CPAP  . RBBB (right bundle branch block with left anterior fascicular block)     Patient Active Problem List   Diagnosis Date Noted  . Shortness of breath 09/22/2016  . Renal insufficiency 03/25/2015  . CAD S/P percutaneous  coronary angioplasty 03/16/2015  . Acute diastolic CHF (congestive heart failure) (HCC) 03/16/2015  . Essential hypertension 03/16/2015  . AKI (acute kidney injury) (HCC) 03/16/2015  . Dyslipidemia 03/16/2015  . Morbid obesity (HCC) 03/16/2015  . Hyperglycemia 03/16/2015  . Tobacco abuse 03/16/2015  . Unstable angina (HCC) 03/13/2015  . NSTEMI (non-ST elevated myocardial infarction) (HCC)   . Dislocation, finger, interphalangeal joint 07/08/2013  . Left knee DJD 01/03/2013  . Lateral meniscus tear 01/03/2013  . Back pain 07/08/2012  . Cough 09/03/2010  . Overweight(278.02) 10/20/2009  . TOBACCO ABUSE 10/20/2009  . RBBB 10/20/2009  . SYNCOPE 10/20/2009  . CHEST PAIN 10/20/2009  . Obstructive sleep apnea 05/11/2007  . ASTHMATIC BRONCHITIS, ACUTE 05/11/2007  . COPD with emphysema (HCC) 05/11/2007  . GERD 05/11/2007    Past Surgical History:  Procedure Laterality Date  . CARDIAC CATHETERIZATION N/A 03/13/2015   Procedure: Left Heart Cath and Coronary Angiography;  Surgeon: Corky Crafts, MD;  Location: [facility] INVASIVE CV LAB;  Service: Cardiovascular;  Laterality: N/A;  . CARDIAC CATHETERIZATION N/A 03/13/2015   Procedure: Coronary Stent Intervention;  Surgeon: Corky Crafts, MD;  Location: [facility] INVASIVE CV LAB;  Service: Cardiovascular;  Laterality: N/A;  . Inguinal artery repair     nguinal  . KNEE ARTHROSCOPY Left 01/03/2013   Procedure: LEFT KNEE ARTHROSCOPY WITH DEBRIDEMENT/MICROFRACTURE/MEDIAL FEMORAL CHONDIAL/PARTIAL MEDIAL AND LATERAL  MENISCECTOMY;  Surgeon: Javier DockerJeffrey C Beane, MD;  Location: [facility] ORS;  Service: Orthopedics;  Laterality: Left;  . Surgery for sleep apnea         Family History  Problem Relation Age of Onset  . Heart disease Mother   . Heart disease Father   . Heart disease Brother        CABG in his 6040's    Social History   Tobacco Use  . Smoking status: Former Smoker    Packs/day: 2.00    Years: 40.00    Pack years: 80.00    Types: Cigarettes     Start date: 03/08/1972    Quit date: 03/12/2015    Years since quitting: 5.0  . Smokeless tobacco: Never Used  . Tobacco comment: started smoking at age 59.    Vaping Use  . Vaping Use: Never used  Substance Use Topics  . Alcohol use: No    Alcohol/week: 0.0 standard drinks  . Drug use: No    Home Medications Prior to Admission medications   Medication Sig Start Date End Date Taking? Authorizing Provider  albuterol (PROVENTIL) (2.5 MG/3ML) 0.083% nebulizer solution TAKE 3 MLS (2.5 MG TOTAL) BY NEBULIZATION EVERY 6 (SIX) HOURS AS NEEDED FOR WHEEZING. 02/12/19   Coralyn HellingSood, Vineet, MD  albuterol (VENTOLIN HFA) 108 (90 Base) MCG/ACT inhaler Inhale 2 puffs into the lungs every 6 (six) hours as needed for wheezing or shortness of breath. Insurance preference 02/12/19   Coralyn HellingSood, Vineet, MD  aspirin 81 MG tablet Take 81 mg by mouth daily. 01/03/13   Jene EveryBeane, Jeffrey, MD  atorvastatin (LIPITOR) 80 MG tablet TAKE 1 TABLET (80 MG TOTAL) BY MOUTH DAILY AT 6 PM. 03/07/16   Jonelle SidleMcDowell, Samuel G, MD  citalopram (CELEXA) 20 MG tablet Take 20 mg by mouth every morning.     [provider]  fluticasone furoate-vilanterol (BREO ELLIPTA) 200-25 MCG/INH AEPB Inhale 1 puff into the lungs daily. 03/04/20   Coralyn HellingSood, Vineet, MD  HYDROcodone-acetaminophen (NORCO/VICODIN) 5-325 MG tablet Take 1 tablet by mouth 3 (three) times daily.    [provider]  metoprolol tartrate (LOPRESSOR) 25 MG tablet TAKE 0.5 TABLETS (12.5 MG TOTAL) BY MOUTH 2 (TWO) TIMES DAILY. 05/03/16   Jonelle SidleMcDowell, Samuel G, MD  NITROSTAT 0.4 MG SL tablet PLACE 1 TABLET UNDER THE TONGUE EVERY 5 MINS AS NEEDED FOR CHEST PAIN. 10/17/19   Jonelle SidleMcDowell, Samuel G, MD  pantoprazole (PROTONIX) 40 MG tablet TAKE 1 TABLET (40 MG TOTAL) BY MOUTH DAILY. 03/07/16   Jonelle SidleMcDowell, Samuel G, MD    Allergies    Patient has no known allergies.  Review of Systems   Review of Systems  All other systems reviewed and are negative. Ten systems reviewed and are negative for  acute change, except as noted in the HPI.   Physical Exam Updated Vital Signs BP (!) 154/87   Pulse (!) 104   Temp (!) 101 F (38.3 C) (Oral)   Resp (!) 24   SpO2 95%   Physical Exam Vitals and nursing note reviewed.  Constitutional:      General: He is not in acute distress.    Appearance: Normal appearance. He is obese. He is ill-appearing. He is not toxic-appearing or diaphoretic.     Comments: Morbidly obese male sitting upright.  He speaks in clear and coherent sentences.  HENT:     Head: Normocephalic and atraumatic.     Right Ear: External ear normal.     Left Ear: External ear normal.  Nose: Nose normal.     Mouth/Throat:     Pharynx: Oropharynx is clear.  Eyes:     General: No scleral icterus.       Right eye: No discharge.        Left eye: No discharge.     Extraocular Movements: Extraocular movements intact.     Conjunctiva/sclera: Conjunctivae normal.  Cardiovascular:     Rate and Rhythm: Normal rate and regular rhythm.     Pulses: Normal pulses.     Heart sounds: Normal heart sounds. No murmur heard.  No friction rub. No gallop.      Comments: Mild tachycardia.  No murmurs, rubs, gallops. Pulmonary:     Effort: Respiratory distress present.     Breath sounds: No stridor.     Comments: Difficult to assess lung sounds secondary to body habitus.  Oxygen saturations around 92% on 3 L via nasal cannula. Abdominal:     General: Abdomen is flat.     Palpations: Abdomen is soft.     Tenderness: There is no abdominal tenderness.     Comments: Protuberant abdomen.  Difficult to assess secondary to body habitus.  No tenderness appreciated in all 4 quadrants.  Musculoskeletal:        General: Normal range of motion.     Cervical back: Normal range of motion and neck supple. No tenderness.     Right lower leg: No edema.     Left lower leg: No edema.  Skin:    General: Skin is warm and dry.  Neurological:     General: No focal deficit present.     Mental Status:  He is alert and oriented to person, place, and time.  Psychiatric:        Mood and Affect: Mood normal.        Behavior: Behavior normal.    ED Results / Procedures / Treatments   Labs (all labs ordered are listed, but only abnormal results are displayed) Labs Reviewed  RESPIRATORY PANEL BY RT PCR (FLU A&B, COVID) - Abnormal; Notable for the following components:      Result Value   SARS Coronavirus 2 by RT PCR POSITIVE (*)    All other components within normal limits  BASIC METABOLIC PANEL - Abnormal; Notable for the following components:   Sodium 134 (*)    Glucose, Bld 108 (*)    Creatinine, Ser 1.47 (*)    Calcium 8.6 (*)    GFR, Estimated 51 (*)    All other components within normal limits  D-DIMER, QUANTITATIVE (NOT AT [facility]) - Abnormal; Notable for the following components:   D-Dimer, Quant 0.91 (*)    All other components within normal limits  FERRITIN - Abnormal; Notable for the following components:   Ferritin 434 (*)    All other components within normal limits  FIBRINOGEN - Abnormal; Notable for the following components:   Fibrinogen 790 (*)    All other components within normal limits  C-REACTIVE PROTEIN - Abnormal; Notable for the following components:   CRP 19.1 (*)    All other components within normal limits  CULTURE, BLOOD (ROUTINE X 2)  CULTURE, BLOOD (ROUTINE X 2)  CBC  LACTIC ACID, PLASMA  PROCALCITONIN  TRIGLYCERIDES  LACTIC ACID, PLASMA  LACTATE DEHYDROGENASE  HIV ANTIBODY (ROUTINE TESTING W REFLEX)  CBC WITH DIFFERENTIAL/PLATELET  COMPREHENSIVE METABOLIC PANEL  C-REACTIVE PROTEIN  D-DIMER, QUANTITATIVE (NOT AT [facility])  FERRITIN  MAGNESIUM  PHOSPHORUS    EKG Interpretation  Date/Time:  Sunday March 29 2020 13:01:13 EDT Ventricular Rate:  104 PR Interval:  130 QRS Duration: 136 QT Interval:  412 QTC Calculation: 541 R Axis:   -64 Text Interpretation: Sinus tachycardia Right bundle branch block Left anterior fascicular block   Bifascicular block  Anterolateral infarct , age undetermined Abnormal ECG Since last tracing now with  RRBBB and LAFB Otherwise no significant change Confirmed by Mancel Bale [phone]) on 03/29/2020 3:59:35 PM   Radiology DG Chest Portable 1 View  Result Date: 03/29/2020 CLINICAL DATA:  Weakness and headaches. EXAM: PORTABLE CHEST 1 VIEW COMPARISON:  May 05, 2016 FINDINGS: The mediastinal contour and cardiac silhouette are normal. Patchy consolidations of the right upper lobe, left mid lung, right lung base are identified. Small left pleural effusion is noted. Bony structures are normal. IMPRESSION: Bilateral pneumonias. Electronically Signed   By: Sherian Rein M.D.   On: 03/29/2020 14:24   Procedures Procedures (including critical care time)  Medications Ordered in ED Medications  remdesivir 100 mg in sodium chloride 0.9 % 100 mL IVPB (100 mg Intravenous New Bag/Given 03/29/20 2312)  remdesivir 100 mg in sodium chloride 0.9 % 100 mL IVPB (has no administration in time range)  heparin injection 5,000 Units (5,000 Units Subcutaneous Given 03/29/20 2313)  albuterol (VENTOLIN HFA) 108 (90 Base) MCG/ACT inhaler 2 puff (2 puffs Inhalation Given 03/29/20 2316)  methylPREDNISolone sodium succinate (SOLU-MEDROL) 125 mg/2 mL injection 100 mg (100 mg Intravenous Given 03/29/20 2306)    Followed by  predniSONE (DELTASONE) tablet 50 mg (has no administration in time range)  guaiFENesin-dextromethorphan (ROBITUSSIN DM) 100-10 MG/5ML syrup 10 mL (has no administration in time range)  chlorpheniramine-HYDROcodone (TUSSIONEX) 10-8 MG/5ML suspension 5 mL (has no administration in time range)  ascorbic acid (VITAMIN C) tablet 500 mg (has no administration in time range)  zinc sulfate capsule 220 mg (has no administration in time range)  acetaminophen (TYLENOL) tablet 650 mg (has no administration in time range)  dexamethasone (DECADRON) injection 10 mg (10 mg Intravenous Given 03/29/20 1835)    acetaminophen (TYLENOL) tablet 1,000 mg (1,000 mg Oral Given 03/29/20 1834)   ED Course  I have reviewed the triage vital signs and the nursing notes.  Pertinent labs & imaging results that were available during my care of the patient were reviewed by me and considered in my medical decision making (see chart for details).  Clinical Course as of Mar 29 2322  Wynelle Link Mar 29, 2020  1827 Chest x-ray personally reviewed with findings as noted below:  The mediastinal contour and cardiac silhouette are normal. Patchy consolidations of the right upper lobe, left mid lung, right lung base are identified. Small left pleural effusion is noted. Bony structures are normal.  DG Chest Portable 1 View [LJ]  1828 SARS Coronavirus 2 by RT PCR(!): POSITIVE [LJ]  1828 Similar to prior values  Creatinine(!): 1.47 [LJ]  2155 Patient discussed with the hospitalist team who will accept the patient for admission at this time.  Requested that I start patient on remdesivir, per pharmacy.  I will do so.   [LJ]    Clinical Course User Index [LJ] Placido Sou, PA-C   MDM Rules/Calculators/A&P                          Patient is a 59 year old male who presents to the emergency department with symptoms related to COVID-19.  Patient notes a history of COPD but denies any at home oxygen requirement.  Patient was  started on 3 L via nasal cannula with oxygen saturations in the low 90s.  Chest x-ray shows multifocal pneumonia.  COVID-19 test is positive today.  States his symptoms started about 2 weeks ago.  He has not been vaccinated for COVID-19.  Given patient's health conditions, current symptoms, recommended admission and he is amenable.  This was discussed with the hospitalist team who will accept him into their care.  They requested that I start patient on remdesivir.  This has been ordered.  Final Clinical Impression(s) / ED Diagnoses Final diagnoses:  COVID-19  Hypoxia    Rx / DC Orders ED Discharge  Orders    None       Placido Sou, PA-C 03/29/20 2325    Placido Sou, PA-C 03/29/20 2325    Mancel Bale, MD 03/30/20 1011

## 2020-03-30 ENCOUNTER — Inpatient Hospital Stay (HOSPITAL_COMMUNITY): Payer: Medicare Other

## 2020-03-30 ENCOUNTER — Encounter (HOSPITAL_COMMUNITY): Payer: Self-pay | Admitting: Internal Medicine

## 2020-03-30 DIAGNOSIS — R9431 Abnormal electrocardiogram [ECG] [EKG]: Secondary | ICD-10-CM

## 2020-03-30 DIAGNOSIS — J9601 Acute respiratory failure with hypoxia: Secondary | ICD-10-CM

## 2020-03-30 DIAGNOSIS — N183 Chronic kidney disease, stage 3 unspecified: Secondary | ICD-10-CM

## 2020-03-30 LAB — COMPREHENSIVE METABOLIC PANEL
ALT: 64 U/L — ABNORMAL HIGH (ref 0–44)
AST: 137 U/L — ABNORMAL HIGH (ref 15–41)
Albumin: 3.2 g/dL — ABNORMAL LOW (ref 3.5–5.0)
Alkaline Phosphatase: 37 U/L — ABNORMAL LOW (ref 38–126)
Anion gap: 12 (ref 5–15)
BUN: 22 mg/dL (ref 8–23)
CO2: 19 mmol/L — ABNORMAL LOW (ref 22–32)
Calcium: 8.6 mg/dL — ABNORMAL LOW (ref 8.9–10.3)
Chloride: 104 mmol/L (ref 98–111)
Creatinine, Ser: 1.41 mg/dL — ABNORMAL HIGH (ref 0.61–1.24)
GFR, Estimated: 53 mL/min — ABNORMAL LOW (ref >60)
Glucose, Bld: 181 mg/dL — ABNORMAL HIGH (ref 70–99)
Potassium: 3.5 mmol/L (ref 3.5–5.1)
Sodium: 135 mmol/L (ref 135–145)
Total Bilirubin: 1.1 mg/dL (ref 0.3–1.2)
Total Protein: 7.9 g/dL (ref 6.5–8.1)

## 2020-03-30 LAB — CBC WITH DIFFERENTIAL/PLATELET
Abs Immature Granulocytes: 0.03 10*3/uL (ref 0.00–0.07)
Basophils Absolute: 0 10*3/uL (ref 0.0–0.1)
Basophils Relative: 0 %
Eosinophils Absolute: 0 10*3/uL (ref 0.0–0.5)
Eosinophils Relative: 0 %
HCT: 39.9 % (ref 39.0–52.0)
Hemoglobin: 13.6 g/dL (ref 13.0–17.0)
Immature Granulocytes: 1 %
Lymphocytes Relative: 11 %
Lymphs Abs: 0.6 10*3/uL — ABNORMAL LOW (ref 0.7–4.0)
MCH: 29.8 pg (ref 26.0–34.0)
MCHC: 34.1 g/dL (ref 30.0–36.0)
MCV: 87.3 fL (ref 80.0–100.0)
Monocytes Absolute: 0.1 10*3/uL (ref 0.1–1.0)
Monocytes Relative: 2 %
Neutro Abs: 5.2 10*3/uL (ref 1.7–7.7)
Neutrophils Relative %: 86 %
Platelets: 284 10*3/uL (ref 150–400)
RBC: 4.57 MIL/uL (ref 4.22–5.81)
RDW: 14.6 % (ref 11.5–15.5)
WBC: 6 10*3/uL (ref 4.0–10.5)
nRBC: 0 % (ref 0.0–0.2)

## 2020-03-30 LAB — GLUCOSE, CAPILLARY
Glucose-Capillary: 162 mg/dL — ABNORMAL HIGH (ref 70–99)
Glucose-Capillary: 171 mg/dL — ABNORMAL HIGH (ref 70–99)
Glucose-Capillary: 188 mg/dL — ABNORMAL HIGH (ref 70–99)

## 2020-03-30 LAB — HIV ANTIBODY (ROUTINE TESTING W REFLEX): HIV Screen 4th Generation wRfx: NONREACTIVE

## 2020-03-30 LAB — LACTIC ACID, PLASMA: Lactic Acid, Venous: 1.7 mmol/L (ref 0.5–1.9)

## 2020-03-30 LAB — D-DIMER, QUANTITATIVE: D-Dimer, Quant: 0.89 ug/mL-FEU — ABNORMAL HIGH (ref 0.00–0.50)

## 2020-03-30 LAB — MAGNESIUM: Magnesium: 1.8 mg/dL (ref 1.7–2.4)

## 2020-03-30 LAB — C-REACTIVE PROTEIN: CRP: 19.4 mg/dL — ABNORMAL HIGH (ref <1.0)

## 2020-03-30 LAB — FERRITIN: Ferritin: 413 ng/mL — ABNORMAL HIGH (ref 24–336)

## 2020-03-30 LAB — PHOSPHORUS: Phosphorus: 1.5 mg/dL — ABNORMAL LOW (ref 2.5–4.6)

## 2020-03-30 MED ORDER — OXYCODONE HCL 5 MG PO TABS
5.0000 mg | ORAL_TABLET | ORAL | Status: DC | PRN
  Administered 2020-03-30 – 2020-04-02 (×7): 5 mg via ORAL
  Filled 2020-03-30 (×7): qty 1

## 2020-03-30 MED ORDER — INSULIN ASPART 100 UNIT/ML ~~LOC~~ SOLN
0.0000 [IU] | Freq: Every day | SUBCUTANEOUS | Status: DC
  Administered 2020-04-01: 2 [IU] via SUBCUTANEOUS

## 2020-03-30 MED ORDER — PANTOPRAZOLE SODIUM 40 MG PO TBEC
40.0000 mg | DELAYED_RELEASE_TABLET | Freq: Every day | ORAL | Status: DC
  Administered 2020-03-30 – 2020-04-02 (×4): 40 mg via ORAL
  Filled 2020-03-30 (×4): qty 1

## 2020-03-30 MED ORDER — IOHEXOL 300 MG/ML  SOLN: 75.0000 mL | Freq: Once | INTRAMUSCULAR | Status: DC | PRN

## 2020-03-30 MED ORDER — INSULIN ASPART 100 UNIT/ML ~~LOC~~ SOLN
0.0000 [IU] | Freq: Three times a day (TID) | SUBCUTANEOUS | Status: DC
  Administered 2020-03-30 – 2020-03-31 (×4): 2 [IU] via SUBCUTANEOUS
  Administered 2020-03-31: 3 [IU] via SUBCUTANEOUS
  Administered 2020-04-01 (×3): 2 [IU] via SUBCUTANEOUS
  Administered 2020-04-02 (×2): 1 [IU] via SUBCUTANEOUS

## 2020-03-30 MED ORDER — ATORVASTATIN CALCIUM 40 MG PO TABS
80.0000 mg | ORAL_TABLET | Freq: Every day | ORAL | Status: DC
  Administered 2020-03-30 – 2020-04-01 (×3): 80 mg via ORAL
  Filled 2020-03-30 (×3): qty 2

## 2020-03-30 MED ORDER — METOPROLOL TARTRATE 25 MG PO TABS
12.5000 mg | ORAL_TABLET | Freq: Two times a day (BID) | ORAL | Status: DC
  Administered 2020-03-30 – 2020-04-02 (×7): 12.5 mg via ORAL
  Filled 2020-03-30 (×7): qty 1

## 2020-03-30 MED ORDER — DM-GUAIFENESIN ER 30-600 MG PO TB12
1.0000 | ORAL_TABLET | Freq: Two times a day (BID) | ORAL | Status: DC
  Administered 2020-03-30 – 2020-04-02 (×7): 1 via ORAL
  Filled 2020-03-30 (×6): qty 1

## 2020-03-30 MED ORDER — ALBUTEROL SULFATE HFA 108 (90 BASE) MCG/ACT IN AERS
2.0000 | INHALATION_SPRAY | Freq: Three times a day (TID) | RESPIRATORY_TRACT | Status: DC
  Administered 2020-03-31 – 2020-04-02 (×5): 2 via RESPIRATORY_TRACT

## 2020-03-30 MED ORDER — IOHEXOL 300 MG/ML  SOLN
150.0000 mL | Freq: Once | INTRAMUSCULAR | Status: AC | PRN
  Administered 2020-03-30: 150 mL via INTRAVENOUS

## 2020-03-30 NOTE — Progress Notes (Signed)
Patient Demographics:    Jared [name], is a 59 y.o. male, DOB - 22-Jan-1961, RXV:[id number]  Admit date - 03/29/2020   Admitting Physician Jared Shown, DO  Outpatient Primary MD for the patient is [name], NP  LOS - 1   Chief Complaint  Patient presents with  . Weakness        Subjective:    Jared [name] today has no fevers, no emesis,  No chest pain,   -Cough and dyspnea persist --Hypoxia persist--- able to titrate down from 10 L of oxygen via nasal cannula to 6 L of oxygen via nasal cannula  Assessment  & Plan :    Active Problems:   Obstructive sleep apnea   GERD   Dyslipidemia   Morbid obesity (HCC)   Pneumonia due to COVID-19 virus   Acute respiratory failure with hypoxia (HCC)   Prolonged QT interval   CKD (chronic kidney disease) stage 3, GFR 30-59 ml/min (HCC)   Brief Summary:- 59 y.o. male with medical history significant for anxiety, COPD, CAD/prior NSTEMI, GERD, OSA, hypertension, CHF, and morbid obesity admitted on 03/29/2020 with acute hypoxic respiratory failure secondary to Covid PNA--      A/p  1)Acute hypoxic respiratory failure secondary to COVID-19 infection/Pneumonia--- The treatment plan and use of medications  for treatment of COVID-19 infection and possible side effects were discussed with patient/family -----Patient/Family verbalizes understanding and agrees to treatment protocols  ---Hypoxia persist--- able to titrate down from 10 L of oxygen via nasal cannula to 6 L of oxygen via nasal cannula --Patient is positive for COVID-19 infection, chest x-ray with findings of infiltrates/opacities,  patient is tachypneic/hypoxic and requiring continuous supplemental oxygen---patient meets criteria for initiation of Remdesivir AND Steroid therapy per protocol  --Check and trend inflammatory markers including D-dimer, ferritin and  CRP---also follow CBC and  CMP --Supplemental oxygen to keep O2 sats above 93% -Follow serial chest x-rays and ABGs as indicated --- Encourage prone positioning for More than 16 hours/day in increments of 2 to 3 hours at a time if able to tolerate --Attempt to maintain euvolemic state --Zinc and vitamin C as ordered -Albuterol inhaler as needed -Accu-Cheks/fingersticks while on high-dose steroids -PPI while on high-dose steroids -Enhanced dosage of anticoagulant for DVT prophylaxis given hypercoagulable state with COVID-19 infections -Continue IV remdesivir and IV steroids started on 03/29/2020 -CTA chest on 03/30/2020 without PE COVID-19 Labs  Recent Labs    03/29/20 2143 03/30/20 0612 03/30/20 0613  DDIMER 0.91* 0.89*  --   FERRITIN 434*  --  413*  LDH 384*  --   --   CRP 19.1*  --  19.4*    Lab Results  Component Value Date   SARSCOV2NAA POSITIVE (A) 03/29/2020     2)Morbid Obesity-/OSA -increases overall morbidity and mortality and complicates overall care -Body mass index is 68.42 kg/m.  --Use CPAP nightly  3)COPD----steroids and bronchodilators as above #1  4)CAD--continue aspirin, metoprolol and Lipitor  5)CKD stage IIIA Creatinine at 1.47 (last lab for comparison was 5 years ago with creatinine at 1.8). Renally adjust medications, avoid nephrotoxic agents/dehydration/hypotension    Disposition/Need for in-Hospital Stay- patient unable to be discharged at this time due to --acute hypoxic respiratory failure secondary to Covid pneumonia requiring IV  Solu-Medrol, IV remdesivir and supplemental oxygen*  Status is: Inpatient  Remains inpatient appropriate because:-acute hypoxic respiratory failure secondary to Covid pneumonia requiring IV Solu-Medrol, IV remdesivir and supplemental oxygen*   Disposition: The patient is from: Home              Anticipated d/c is to: Home              Anticipated d/c date is: 2 days              Patient currently is not medically stable to  d/c. Barriers: Not Clinically Stable- -acute hypoxic respiratory failure secondary to Covid pneumonia requiring IV Solu-Medrol, IV remdesivir and supplemental oxygen*  Code Status : Full code  Family Communication:   (patient is alert, awake and coherent)    Consults  :  na  DVT Prophylaxis  :   - Heparin - SCDs   Lab Results  Component Value Date   PLT 284 03/30/2020    Inpatient Medications  Scheduled Meds: . albuterol  2 puff Inhalation Q6H  . vitamin C  500 mg Oral Daily  . atorvastatin  80 mg Oral q1800  . dextromethorphan-guaiFENesin  1 tablet Oral BID  . heparin  5,000 Units Subcutaneous Q8H  . insulin aspart  0-5 Units Subcutaneous QHS  . insulin aspart  0-9 Units Subcutaneous TID WC  . methylPREDNISolone (SOLU-MEDROL) injection  100 mg Intravenous Q12H   Followed by  . [START ON 04/02/2020] predniSONE  50 mg Oral Daily  . metoprolol tartrate  12.5 mg Oral BID  . pantoprazole  40 mg Oral Daily  . zinc sulfate  220 mg Oral Daily   Continuous Infusions: . remdesivir 100 mg in NS 100 mL 100 mg (03/30/20 1022)   PRN Meds:.acetaminophen, chlorpheniramine-HYDROcodone, guaiFENesin-dextromethorphan, oxyCODONE    Anti-infectives (From admission, onward)   Start     Dose/Rate Route Frequency Ordered Stop   03/30/20 1000  remdesivir 100 mg in sodium chloride 0.9 % 100 mL IVPB        100 mg 200 mL/hr over 30 Minutes Intravenous Daily 03/29/20 2157 04/03/20 0959   03/29/20 2200  remdesivir 100 mg in sodium chloride 0.9 % 100 mL IVPB        100 mg 200 mL/hr over 30 Minutes Intravenous Every 30 min 03/29/20 2157 03/30/20 0116        Objective:   Vitals:   03/30/20 1100 03/30/20 1326 03/30/20 1506 03/30/20 1700  BP:   (!) 145/79   Pulse:   80   Resp:   18   Temp:   98 F (36.7 C)   TempSrc:      SpO2:  92% (!) 89% 92%  Weight: (!) 204.1 kg       Wt Readings from Last 3 Encounters:  03/30/20 (!) 204.1 kg  10/17/19 (!) 205.5 kg  05/01/19 (!) 204.1 kg    No  intake or output data in the 24 hours ending 03/30/20 1838   Physical Exam  Gen:- Awake Alert, [name] obese, some conversational dyspnea HEENT:- [place].AT, No sclera icterus Nose-  6L/min Neck-Supple Neck,No JVD,.  Lungs-diminished breath sounds, scattered rhonchi  CV- S1, S2 normal, regular  Abd-  +ve B.Sounds, Abd Soft, No tenderness, increased truncal adiposity Extremity/Skin:- No  edema, pedal pulses present  Psych-affect is appropriate, oriented x3 Neuro-no new focal deficits, no tremors   Data Review:   Micro Results Recent Results (from the past 240 hour(s))  Respiratory Panel by RT PCR (Flu A&B,  Covid) - Nasopharyngeal Swab     Status: Abnormal   Collection Time: 03/29/20  1:02 PM   Specimen: Nasopharyngeal Swab  Result Value Ref Range Status   SARS Coronavirus 2 by RT PCR POSITIVE (A) NEGATIVE Final    Comment: RESULT CALLED TO, READ BACK BY AND VERIFIED WITH: HOLCULM R. @ 1435 ON 101021 BY HENDERSON L. (NOTE) SARS-CoV-2 target nucleic acids are DETECTED.  SARS-CoV-2 RNA is generally detectable in upper respiratory specimens  during the acute phase of infection. Positive results are indicative of the presence of the identified virus, but do not rule out bacterial infection or co-infection with other pathogens not detected by the test. Clinical correlation with patient history and other diagnostic information is necessary to determine patient infection status. The expected result is Negative.  Fact Sheet for Patients:  https://www.moore.com/  Fact Sheet for Healthcare Providers: https://www.young.biz/  This test is not yet approved or cleared by the [place] FDA and  has been authorized for detection and/or diagnosis of SARS-CoV-2 by FDA under an Emergency Use Authorization (EUA).  This EUA will remain in effect (meaning this tes t can be used) for the duration of  the COVID-19 declaration under Section 564(b)(1) of the  Act, 21 U.S.C. section 360bbb-3(b)(1), unless the authorization is terminated or revoked sooner.      Influenza A by PCR NEGATIVE NEGATIVE Final   Influenza B by PCR NEGATIVE NEGATIVE Final    Comment: (NOTE) The Xpert Xpress SARS-CoV-2/FLU/RSV assay is intended as an aid in  the diagnosis of influenza from Nasopharyngeal swab specimens and  should not be used as a sole basis for treatment. Nasal washings and  aspirates are unacceptable for Xpert Xpress SARS-CoV-2/FLU/RSV  testing.  Fact Sheet for Patients: https://www.moore.com/  Fact Sheet for Healthcare Providers: https://www.young.biz/  This test is not yet approved or cleared by the [place] FDA and  has been authorized for detection and/or diagnosis of SARS-CoV-2 by  FDA under an Emergency Use Authorization (EUA). This EUA will remain  in effect (meaning this test can be used) for the duration of the  Covid-19 declaration under Section 564(b)(1) of the Act, 21  U.S.C. section 360bbb-3(b)(1), unless the authorization is  terminated or revoked. Performed at [facility], [address], [place], [place] [address]   Blood Culture (routine x 2)     Status: None (Preliminary result)   Collection Time: 03/29/20  9:16 PM   Specimen: BLOOD RIGHT HAND  Result Value Ref Range Status   Specimen Description BLOOD RIGHT HAND  Final   Special Requests   Final    BOTTLES DRAWN AEROBIC AND ANAEROBIC Blood Culture adequate volume   Culture   Final    NO GROWTH < 24 HOURS Performed at [facility], [address] [place], [place] [address]    Report Status PENDING  Incomplete  Blood Culture (routine x 2)     Status: None (Preliminary result)   Collection Time: 03/29/20  9:30 PM   Specimen: Left Antecubital; Blood  Result Value Ref Range Status   Specimen Description LEFT ANTECUBITAL  Final   Special Requests   Final    BOTTLES DRAWN AEROBIC AND ANAEROBIC Blood Culture adequate volume    Culture   Final    NO GROWTH < 12 HOURS Performed at [facility], [address], [place], [place] [address]    Report Status PENDING  Incomplete    Radiology Reports CT CHEST W CONTRAST  Result Date: 03/30/2020 CLINICAL DATA:  COVID-19 positive with apparent pneumonia EXAM: CT CHEST WITH CONTRAST TECHNIQUE: Multidetector CT imaging of the chest was performed during intravenous contrast administration. CONTRAST:  150mL OMNIPAQUE IOHEXOL 300 MG/ML  SOLN COMPARISON:  March 29, 2020 chest radiograph; April 14, 2015 FINDINGS: Cardiovascular: No appreciable pulmonary emboli. No thoracic aortic aneurysm or dissection evident. Visualized great vessels appear unremarkable. Note that the right innominate and left common carotid arteries arise as a common trunk, an anatomic variant. There is no appreciable pericardial effusion or pericardial thickening. Mediastinum/Nodes: Thyroid appears normal. No appreciable thoracic adenopathy. There is moderate anterior mediastinal fat. No esophageal lesions are appreciable. Lungs/Pleura: There is extensive multifocal airspace opacity throughout the lungs bilaterally consistent with multifocal pneumonia. No appreciable pleural effusions. No frank consolidation. Upper Abdomen: There is hepatic steatosis. Visualized upper abdominal structures otherwise appear unremarkable. Is Musculoskeletal: There is degenerative change in the thoracic spine with diffuse idiopathic skeletal hyperostosis in the mid to lower thoracic region. No blastic or lytic bone lesions. No chest wall lesions. IMPRESSION: 1. Multifocal airspace opacity consistent with multifocal pneumonia. Suspect atypical organism pneumonia, particularly given the clinical history. No consolidation or pleural effusions. 2.  No evident adenopathy. 3. No pulmonary embolus is appreciable. No thoracic aortic aneurysm or dissection. 4.  Hepatic steatosis. 5. There is diffuse idiopathic skeletal hyperostosis in the mid to lower  thoracic spine region. Electronically Signed   By: Bretta BangWilliam  Woodruff III M.D.   On: 03/30/2020 09:51   DG Chest Portable 1 View  Result Date: 03/29/2020 CLINICAL DATA:  Weakness and headaches. EXAM: PORTABLE CHEST 1 VIEW COMPARISON:  May 05, 2016 FINDINGS: The mediastinal contour and cardiac silhouette are normal. Patchy consolidations of the right upper lobe, left mid lung, right lung base are identified. Small left pleural effusion is noted. Bony structures are normal. IMPRESSION: Bilateral pneumonias. Electronically Signed   By: Sherian ReinWei-Chen  Lin M.D.   On: 03/29/2020 14:24     CBC Recent Labs  Lab 03/29/20 1315 03/30/20 0612  WBC 7.1 6.0  HGB 13.4 13.6  HCT 39.8 39.9  PLT 249 284  MCV 89.2 87.3  MCH 30.0 29.8  MCHC 33.7 34.1  RDW 14.8 14.6  LYMPHSABS  --  0.6*  MONOABS  --  0.1  EOSABS  --  0.0  BASOSABS  --  0.0    Chemistries  Recent Labs  Lab 03/29/20 1315 03/30/20 0612  NA 134* 135  K 4.1 3.5  CL 101 104  CO2 22 19*  GLUCOSE 108* 181*  BUN 13 22  CREATININE 1.47* 1.41*  CALCIUM 8.6* 8.6*  MG  --  1.8  AST  --  137*  ALT  --  64*  ALKPHOS  --  37*  BILITOT  --  1.1   ------------------------------------------------------------------------------------------------------------------ Recent Labs    03/29/20 2143  TRIG 138    Lab Results  Component Value Date   HGBA1C 5.9 (H) 03/16/2015   ------------------------------------------------------------------------------------------------------------------ No results for input(s): TSH, T4TOTAL, T3FREE, THYROIDAB in the last 72 hours.  Invalid input(s): FREET3 ------------------------------------------------------------------------------------------------------------------ Recent Labs    03/29/20 2143 03/30/20 0613  FERRITIN 434* 413*    Coagulation profile No results for input(s): INR, PROTIME in the last 168 hours.  Recent Labs    03/29/20 2143 03/30/20 0612  DDIMER 0.91* 0.89*    Cardiac  Enzymes No results for input(s): CKMB, TROPONINI, MYOGLOBIN in the last 168 hours.  Invalid input(s): CK ------------------------------------------------------------------------------------------------------------------    Component Value Date/Time   BNP 72.6 03/16/2015 0843  Shon Hale M.D on 03/30/2020 at 6:38 PM  Go to [email or web address] - for contact info  [facility] - Office  [phone]

## 2020-03-30 NOTE — ED Notes (Signed)
Attempted report x1. 

## 2020-03-30 NOTE — ED Notes (Signed)
Pt transported to CT ?

## 2020-03-31 LAB — GLUCOSE, CAPILLARY
Glucose-Capillary: 171 mg/dL — ABNORMAL HIGH (ref 70–99)
Glucose-Capillary: 153 mg/dL — ABNORMAL HIGH (ref 70–99)
Glucose-Capillary: 213 mg/dL — ABNORMAL HIGH (ref 70–99)
Glucose-Capillary: 188 mg/dL — ABNORMAL HIGH (ref 70–99)

## 2020-03-31 LAB — CBC WITH DIFFERENTIAL/PLATELET
Abs Immature Granulocytes: 0.11 10*3/uL — ABNORMAL HIGH (ref 0.00–0.07)
Basophils Absolute: 0 10*3/uL (ref 0.0–0.1)
Basophils Relative: 0 %
Eosinophils Absolute: 0 10*3/uL (ref 0.0–0.5)
Eosinophils Relative: 0 %
HCT: 40.6 % (ref 39.0–52.0)
Hemoglobin: 13.6 g/dL (ref 13.0–17.0)
Immature Granulocytes: 1 %
Lymphocytes Relative: 7 %
Lymphs Abs: 0.8 10*3/uL (ref 0.7–4.0)
MCH: 29.4 pg (ref 26.0–34.0)
MCHC: 33.5 g/dL (ref 30.0–36.0)
MCV: 87.9 fL (ref 80.0–100.0)
Monocytes Absolute: 0.5 10*3/uL (ref 0.1–1.0)
Monocytes Relative: 4 %
Neutro Abs: 10.5 10*3/uL — ABNORMAL HIGH (ref 1.7–7.7)
Neutrophils Relative %: 88 %
Platelets: 378 10*3/uL (ref 150–400)
RBC: 4.62 MIL/uL (ref 4.22–5.81)
RDW: 14.8 % (ref 11.5–15.5)
WBC: 11.9 10*3/uL — ABNORMAL HIGH (ref 4.0–10.5)
nRBC: 0 % (ref 0.0–0.2)

## 2020-03-31 LAB — COMPREHENSIVE METABOLIC PANEL
ALT: 82 U/L — ABNORMAL HIGH (ref 0–44)
AST: 145 U/L — ABNORMAL HIGH (ref 15–41)
Albumin: 3.4 g/dL — ABNORMAL LOW (ref 3.5–5.0)
Alkaline Phosphatase: 40 U/L (ref 38–126)
Anion gap: 12 (ref 5–15)
BUN: 34 mg/dL — ABNORMAL HIGH (ref 8–23)
CO2: 22 mmol/L (ref 22–32)
Calcium: 8.9 mg/dL (ref 8.9–10.3)
Chloride: 102 mmol/L (ref 98–111)
Creatinine, Ser: 1.6 mg/dL — ABNORMAL HIGH (ref 0.61–1.24)
GFR, Estimated: 46 mL/min — ABNORMAL LOW (ref >60)
Glucose, Bld: 177 mg/dL — ABNORMAL HIGH (ref 70–99)
Potassium: 3.4 mmol/L — ABNORMAL LOW (ref 3.5–5.1)
Sodium: 136 mmol/L (ref 135–145)
Total Bilirubin: 1.1 mg/dL (ref 0.3–1.2)
Total Protein: 7.8 g/dL (ref 6.5–8.1)

## 2020-03-31 LAB — FERRITIN: Ferritin: 632 ng/mL — ABNORMAL HIGH (ref 24–336)

## 2020-03-31 LAB — PHOSPHORUS: Phosphorus: 2.1 mg/dL — ABNORMAL LOW (ref 2.5–4.6)

## 2020-03-31 LAB — C-REACTIVE PROTEIN: CRP: 9.4 mg/dL — ABNORMAL HIGH (ref <1.0)

## 2020-03-31 LAB — MAGNESIUM: Magnesium: 2.1 mg/dL (ref 1.7–2.4)

## 2020-03-31 LAB — D-DIMER, QUANTITATIVE: D-Dimer, Quant: 0.44 ug/mL-FEU (ref 0.00–0.50)

## 2020-03-31 MED ORDER — POTASSIUM CHLORIDE CRYS ER 20 MEQ PO TBCR
40.0000 meq | EXTENDED_RELEASE_TABLET | ORAL | Status: AC
  Administered 2020-03-31 (×2): 40 meq via ORAL
  Filled 2020-03-31 (×2): qty 2

## 2020-03-31 MED ORDER — ALUM & MAG HYDROXIDE-SIMETH 200-200-20 MG/5ML PO SUSP
30.0000 mL | Freq: Four times a day (QID) | ORAL | Status: DC | PRN
  Administered 2020-03-31: 30 mL via ORAL
  Filled 2020-03-31: qty 30

## 2020-03-31 NOTE — Progress Notes (Signed)
Patient Demographics:    Jared [name], is a 59 y.o. male, DOB - [birth year], JKK:[id number]  Admit date - 03/29/2020   Admitting Physician Frankey Shown, DO  Outpatient Primary MD for the patient is [name], NP  LOS - 2   Chief Complaint  Patient presents with  . Weakness        Subjective:    [name] has no fevers, no emesis,  No chest pain,   -Cough and dyspnea persist 03/31/20 --Hypoxia persist--- able to titrate down from 6 L of oxygen via nasal cannula to 4 L of oxygen via nasal cannula  Assessment  & Plan :    Active Problems:   Obstructive sleep apnea   GERD   Dyslipidemia   Morbid obesity (HCC)   Pneumonia due to COVID-19 virus   Acute respiratory failure with hypoxia (HCC)   Prolonged QT interval   CKD (chronic kidney disease) stage 3, GFR 30-59 ml/min (HCC)   Brief Summary:- 59 y.o. male with medical history significant for anxiety, COPD, CAD/prior NSTEMI, GERD, OSA, hypertension, CHF, and morbid obesity admitted on 03/29/2020 with acute hypoxic respiratory failure secondary to Covid PNA--    A/p  1)Acute hypoxic respiratory failure secondary to COVID-19 infection/Pneumonia--- The treatment plan and use of medications  for treatment of COVID-19 infection and possible side effects were discussed with patient/family -----Patient/Family verbalizes understanding and agrees to treatment protocols  ---Hypoxia persist--- able to titrate down from 10 L of oxygen via nasal cannula to 6 L of oxygen via nasal cannula --Patient is positive for COVID-19 infection, chest x-ray with findings of infiltrates/opacities,  patient is tachypneic/hypoxic and requiring continuous supplemental oxygen---patient meets criteria for initiation of Remdesivir AND Steroid therapy per protocol  --Check and trend inflammatory markers including D-dimer, ferritin and  CRP---also follow CBC  and CMP --Supplemental oxygen to keep O2 sats above 93% -Follow serial chest x-rays and ABGs as indicated --- Encourage prone positioning for More than 16 hours/day in increments of 2 to 3 hours at a time if able to tolerate --Attempt to maintain euvolemic state --Zinc and vitamin C as ordered -Albuterol inhaler as needed -Accu-Cheks/fingersticks while on high-dose steroids -PPI while on high-dose steroids -Enhanced dosage of anticoagulant for DVT prophylaxis given hypercoagulable state with COVID-19 infections -Continue IV remdesivir and IV steroids started on 03/29/2020 -CTA chest on 03/30/2020 without PE COVID-19 Labs  Recent Labs    03/29/20 2143 03/30/20 0612 03/30/20 0613 03/31/20 0834 03/31/20 0835  DDIMER 0.91* 0.89*  --  0.44  --   FERRITIN 434*  --  413*  --  632*  LDH 384*  --   --   --   --   CRP 19.1*  --  19.4*  --  9.4*    Lab Results  Component Value Date   SARSCOV2NAA POSITIVE (A) 03/29/2020     2)Morbid Obesity-/OSA -increases overall morbidity and mortality and complicates overall care -Body mass index is 68.42 kg/m.  --Use CPAP nightly  3)COPD----steroids and bronchodilators as above #1  4)CAD--continue aspirin, metoprolol and Lipitor  5)CKD stage IIIA Creatinine is up to 1.60 (last lab for comparison was 5 years ago with creatinine at 1.8). Renally adjust medications, avoid nephrotoxic agents/dehydration/hypotension    Disposition/Need  for in-Hospital Stay- patient unable to be discharged at this time due to --acute hypoxic respiratory failure secondary to Covid pneumonia requiring IV Solu-Medrol, IV remdesivir and supplemental oxygen*  Status is: Inpatient  Remains inpatient appropriate because:-acute hypoxic respiratory failure secondary to Covid pneumonia requiring IV Solu-Medrol, IV remdesivir and supplemental oxygen*   Disposition: The patient is from: Home              Anticipated d/c is to: Home              Anticipated d/c date  is: 2 days              Patient currently is not medically stable to d/c. Barriers: Not Clinically Stable- -acute hypoxic respiratory failure secondary to Covid pneumonia requiring IV Solu-Medrol, IV remdesivir and supplemental oxygen*  Code Status : Full code  Family Communication:   (patient is alert, awake and coherent)    Consults  :  na  DVT Prophylaxis  :   - Heparin - SCDs   Lab Results  Component Value Date   PLT 378 03/31/2020    Inpatient Medications  Scheduled Meds: . albuterol  2 puff Inhalation TID  . vitamin C  500 mg Oral Daily  . atorvastatin  80 mg Oral q1800  . dextromethorphan-guaiFENesin  1 tablet Oral BID  . heparin  5,000 Units Subcutaneous Q8H  . insulin aspart  0-5 Units Subcutaneous QHS  . insulin aspart  0-9 Units Subcutaneous TID WC  . methylPREDNISolone (SOLU-MEDROL) injection  100 mg Intravenous Q12H   Followed by  . [START ON 04/02/2020] predniSONE  50 mg Oral Daily  . metoprolol tartrate  12.5 mg Oral BID  . pantoprazole  40 mg Oral Daily  . zinc sulfate  220 mg Oral Daily   Continuous Infusions: . remdesivir 100 mg in NS 100 mL 100 mg (03/31/20 0906)   PRN Meds:.acetaminophen, alum & mag hydroxide-simeth, chlorpheniramine-HYDROcodone, guaiFENesin-dextromethorphan, oxyCODONE    Anti-infectives (From admission, onward)   Start     Dose/Rate Route Frequency Ordered Stop   03/30/20 1000  remdesivir 100 mg in sodium chloride 0.9 % 100 mL IVPB        100 mg 200 mL/hr over 30 Minutes Intravenous Daily 03/29/20 2157 04/03/20 0959   03/29/20 2200  remdesivir 100 mg in sodium chloride 0.9 % 100 mL IVPB        100 mg 200 mL/hr over 30 Minutes Intravenous Every 30 min 03/29/20 2157 03/30/20 0116        Objective:   Vitals:   03/31/20 0857 03/31/20 1232 03/31/20 1312 03/31/20 1800  BP:  (!) 109/47  (!) 155/85  Pulse:  91  83  Resp:  20  18  Temp:    98.4 F (36.9 C)  TempSrc:    Oral  SpO2: 92% 95% 95% 93%  Weight:      Height:         Wt Readings from Last 3 Encounters:  03/30/20 (!) 204.1 kg  10/17/19 (!) 205.5 kg  05/01/19 (!) 204.1 kg     Intake/Output Summary (Last 24 hours) at 03/31/2020 1926 Last data filed at 03/31/2020 1500 Gross per 24 hour  Intake 687.92 ml  Output --  Net 687.92 ml     Physical Exam  Gen:- Awake Alert, morbidly obese, some conversational dyspnea HEENT:- [place].AT, No sclera icterus Nose- [place] 4L/min Neck-Supple Neck,No JVD,.  Lungs-diminished breath sounds, scattered rhonchi  CV- S1, S2 normal, regular  Abd-  +  ve B.Sounds, Abd Soft, No tenderness, increased truncal adiposity Extremity/Skin:- No  edema, pedal pulses present  Psych-affect is appropriate, oriented x3 Neuro-no new focal deficits, no tremors   Data Review:   Micro Results Recent Results (from the past 240 hour(s))  Respiratory Panel by RT PCR (Flu A&B, Covid) - Nasopharyngeal Swab     Status: Abnormal   Collection Time: 03/29/20  1:02 PM   Specimen: Nasopharyngeal Swab  Result Value Ref Range Status   SARS Coronavirus 2 by RT PCR POSITIVE (A) NEGATIVE Final    Comment: RESULT CALLED TO, READ BACK BY AND VERIFIED WITH: HOLCULM R. @ 1435 ON 101021 BY HENDERSON L. (NOTE) SARS-CoV-2 target nucleic acids are DETECTED.  SARS-CoV-2 RNA is generally detectable in upper respiratory specimens  during the acute phase of infection. Positive results are indicative of the presence of the identified virus, but do not rule out bacterial infection or co-infection with other pathogens not detected by the test. Clinical correlation with patient history and other diagnostic information is necessary to determine patient infection status. The expected result is Negative.  Fact Sheet for Patients:  https://www.moore.com/  Fact Sheet for Healthcare Providers: https://www.young.biz/  This test is not yet approved or cleared by the [place] FDA and  has been authorized for detection  and/or diagnosis of SARS-CoV-2 by FDA under an Emergency Use Authorization (EUA).  This EUA will remain in effect (meaning this tes t can be used) for the duration of  the COVID-19 declaration under Section 564(b)(1) of the Act, 21 U.S.C. section 360bbb-3(b)(1), unless the authorization is terminated or revoked sooner.      Influenza A by PCR NEGATIVE NEGATIVE Final   Influenza B by PCR NEGATIVE NEGATIVE Final    Comment: (NOTE) The Xpert Xpress SARS-CoV-2/FLU/RSV assay is intended as an aid in  the diagnosis of influenza from Nasopharyngeal swab specimens and  should not be used as a sole basis for treatment. Nasal washings and  aspirates are unacceptable for Xpert Xpress SARS-CoV-2/FLU/RSV  testing.  Fact Sheet for Patients: https://www.moore.com/  Fact Sheet for Healthcare Providers: https://www.young.biz/  This test is not yet approved or cleared by the [place] FDA and  has been authorized for detection and/or diagnosis of SARS-CoV-2 by  FDA under an Emergency Use Authorization (EUA). This EUA will remain  in effect (meaning this test can be used) for the duration of the  Covid-19 declaration under Section 564(b)(1) of the Act, 21  U.S.C. section 360bbb-3(b)(1), unless the authorization is  terminated or revoked. Performed at [facility], [address], [place], [place] [address]   Blood Culture (routine x 2)     Status: None (Preliminary result)   Collection Time: 03/29/20  9:16 PM   Specimen: BLOOD RIGHT HAND  Result Value Ref Range Status   Specimen Description BLOOD RIGHT HAND  Final   Special Requests   Final    BOTTLES DRAWN AEROBIC AND ANAEROBIC Blood Culture adequate volume   Culture   Final    NO GROWTH < 24 HOURS Performed at [facility], [address], [place], [place] [address]    Report Status PENDING  Incomplete  Blood Culture (routine x 2)     Status: None (Preliminary result)   Collection Time: 03/29/20   9:30 PM   Specimen: Left Antecubital; Blood  Result Value Ref Range Status   Specimen Description LEFT ANTECUBITAL  Final   Special Requests   Final    BOTTLES DRAWN AEROBIC AND ANAEROBIC Blood Culture  adequate volume   Culture   Final    NO GROWTH < 12 HOURS Performed at [facility], [address] [place], [place] [address]    Report Status PENDING  Incomplete    Radiology Reports CT CHEST W CONTRAST  Result Date: 03/30/2020 CLINICAL DATA:  COVID-19 positive with apparent pneumonia EXAM: CT CHEST WITH CONTRAST TECHNIQUE: Multidetector CT imaging of the chest was performed during intravenous contrast administration. CONTRAST:  OMNIPAQUE IOHEXOL 300 MG/ML  SOLN COMPARISON:  March 29, 2020 chest radiograph; April 14, 2015 FINDINGS: Cardiovascular: No appreciable pulmonary emboli. No thoracic aortic aneurysm or dissection evident. Visualized great vessels appear unremarkable. Note that the right innominate and left common carotid arteries arise as a common trunk, an anatomic variant. There is no appreciable pericardial effusion or pericardial thickening. Mediastinum/Nodes: Thyroid appears normal. No appreciable thoracic adenopathy. There is moderate anterior mediastinal fat. No esophageal lesions are appreciable. Lungs/Pleura: There is extensive multifocal airspace opacity throughout the lungs bilaterally consistent with multifocal pneumonia. No appreciable pleural effusions. No frank consolidation. Upper Abdomen: There is hepatic steatosis. Visualized upper abdominal structures otherwise appear unremarkable. Is Musculoskeletal: There is degenerative change in the thoracic spine with diffuse idiopathic skeletal hyperostosis in the mid to lower thoracic region. No blastic or lytic bone lesions. No chest wall lesions. IMPRESSION: 1. Multifocal airspace opacity consistent with multifocal pneumonia. Suspect atypical organism pneumonia, particularly given the clinical history. No consolidation  or pleural effusions. 2.  No evident adenopathy. 3. No pulmonary embolus is appreciable. No thoracic aortic aneurysm or dissection. 4.  Hepatic steatosis. 5. 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 Electronically Signed   By: Bretta Bang III M.D.   On: 03/30/2020 09:51   DG Chest Portable 1 View  Result Date: 03/29/2020 CLINICAL DATA:  Weakness and headaches. EXAM: PORTABLE CHEST 1 VIEW COMPARISON:  May 05, 2016 FINDINGS: The mediastinal contour and cardiac silhouette are normal. Patchy consolidations of the right upper lobe, left mid lung, right lung base are identified. Small left pleural effusion is noted. Bony structures are normal. IMPRESSION: Bilateral pneumonias. Electronically Signed   By: Sherian Rein M.D.   On: 03/29/2020 14:24     CBC Recent Labs  Lab 03/29/20 1315 03/30/20 0612 03/31/20 0834  WBC 7.1 6.0 11.9*  HGB 13.4 13.6 13.6  HCT 39.8 39.9 40.6  PLT 249 284 378  MCV 89.2 87.3 87.9  MCH 30.0 29.8 29.4  MCHC 33.7 34.1 33.5  RDW 14.8 14.6 14.8  LYMPHSABS  --  0.6* 0.8  MONOABS  --  0.1 0.5  EOSABS  --  0.0 0.0  BASOSABS  --  0.0 0.0    Chemistries  Recent Labs  Lab 03/29/20 1315 03/30/20 0612 03/31/20 0834  NA 134* 135 136  K 4.1 3.5 3.4*  CL 101 104 102  CO2 22 19* 22  GLUCOSE 108* 181* 177*  BUN 13 22 34*  CREATININE 1.47* 1.41* 1.60*  CALCIUM 8.6* 8.6* 8.9  MG  --  1.8 2.1  AST  --  137* 145*  ALT  --  64* 82*  ALKPHOS  --  37* 40  BILITOT  --  1.1 1.1   ------------------------------------------------------------------------------------------------------------------ Recent Labs    03/29/20 2143  TRIG 138    Lab Results  Component Value Date   HGBA1C 5.9 (H) 03/16/2015   ------------------------------------------------------------------------------------------------------------------ No results for input(s): TSH, T4TOTAL, T3FREE, THYROIDAB in the last 72 hours.  Invalid  input(s): FREET3 ------------------------------------------------------------------------------------------------------------------  Recent Labs    03/30/20 0613 03/31/20 0835  FERRITIN 413* 632*    Coagulation profile No results for input(s): INR, PROTIME in the last 168 hours.  Recent Labs    03/30/20 0612 03/31/20 0834  DDIMER 0.89* 0.44    Cardiac Enzymes No results for input(s): CKMB, TROPONINI, MYOGLOBIN in the last 168 hours.  Invalid input(s): CK ------------------------------------------------------------------------------------------------------------------    Component Value Date/Time   BNP 72.6 03/16/2015 0843     Shon Haleourage Hermen Mario M.D on 03/31/2020 at 7:26 PM  Go to [email or web address] - for contact info  [facility] - Office  [phone]

## 2020-03-31 NOTE — Plan of Care (Signed)
Explained treatment plan & goals of care for today. Patient able to move himself around in the bed & sitting up to eat. Some chronic back pain resolved with PRN pain medication.

## 2020-03-31 NOTE — Progress Notes (Signed)
Assumed care of patient at 1300. Condition stable. No changes in initial am assessment. Pt had an uneventful shift. Has been sitting up on side of bed. Continue with plan of care

## 2020-04-01 LAB — CBC WITH DIFFERENTIAL/PLATELET
Abs Immature Granulocytes: 0.13 10*3/uL — ABNORMAL HIGH (ref 0.00–0.07)
Basophils Absolute: 0 10*3/uL (ref 0.0–0.1)
Basophils Relative: 0 %
Eosinophils Absolute: 0 10*3/uL (ref 0.0–0.5)
Eosinophils Relative: 0 %
HCT: 41.5 % (ref 39.0–52.0)
Hemoglobin: 13.6 g/dL (ref 13.0–17.0)
Immature Granulocytes: 1 %
Lymphocytes Relative: 6 %
Lymphs Abs: 0.7 10*3/uL (ref 0.7–4.0)
MCH: 29.2 pg (ref 26.0–34.0)
MCHC: 32.8 g/dL (ref 30.0–36.0)
MCV: 89.2 fL (ref 80.0–100.0)
Monocytes Absolute: 0.6 10*3/uL (ref 0.1–1.0)
Monocytes Relative: 5 %
Neutro Abs: 11.5 10*3/uL — ABNORMAL HIGH (ref 1.7–7.7)
Neutrophils Relative %: 88 %
Platelets: 431 10*3/uL — ABNORMAL HIGH (ref 150–400)
RBC: 4.65 MIL/uL (ref 4.22–5.81)
RDW: 14.9 % (ref 11.5–15.5)
WBC: 12.9 10*3/uL — ABNORMAL HIGH (ref 4.0–10.5)
nRBC: 0 % (ref 0.0–0.2)

## 2020-04-01 LAB — COMPREHENSIVE METABOLIC PANEL
ALT: 83 U/L — ABNORMAL HIGH (ref 0–44)
AST: 124 U/L — ABNORMAL HIGH (ref 15–41)
Albumin: 3.4 g/dL — ABNORMAL LOW (ref 3.5–5.0)
Alkaline Phosphatase: 45 U/L (ref 38–126)
Anion gap: 11 (ref 5–15)
BUN: 41 mg/dL — ABNORMAL HIGH (ref 8–23)
CO2: 24 mmol/L (ref 22–32)
Calcium: 9 mg/dL (ref 8.9–10.3)
Chloride: 102 mmol/L (ref 98–111)
Creatinine, Ser: 1.66 mg/dL — ABNORMAL HIGH (ref 0.61–1.24)
GFR, Estimated: 44 mL/min — ABNORMAL LOW (ref >60)
Glucose, Bld: 171 mg/dL — ABNORMAL HIGH (ref 70–99)
Potassium: 4.2 mmol/L (ref 3.5–5.1)
Sodium: 137 mmol/L (ref 135–145)
Total Bilirubin: 1.1 mg/dL (ref 0.3–1.2)
Total Protein: 7.9 g/dL (ref 6.5–8.1)

## 2020-04-01 LAB — D-DIMER, QUANTITATIVE: D-Dimer, Quant: 0.33 ug/mL-FEU (ref 0.00–0.50)

## 2020-04-01 LAB — GLUCOSE, CAPILLARY
Glucose-Capillary: 156 mg/dL — ABNORMAL HIGH (ref 70–99)
Glucose-Capillary: 173 mg/dL — ABNORMAL HIGH (ref 70–99)
Glucose-Capillary: 187 mg/dL — ABNORMAL HIGH (ref 70–99)
Glucose-Capillary: 204 mg/dL — ABNORMAL HIGH (ref 70–99)

## 2020-04-01 LAB — C-REACTIVE PROTEIN: CRP: 5.2 mg/dL — ABNORMAL HIGH (ref <1.0)

## 2020-04-01 LAB — FERRITIN: Ferritin: 632 ng/mL — ABNORMAL HIGH (ref 24–336)

## 2020-04-01 LAB — PHOSPHORUS: Phosphorus: 2.6 mg/dL (ref 2.5–4.6)

## 2020-04-01 LAB — MAGNESIUM: Magnesium: 2.4 mg/dL (ref 1.7–2.4)

## 2020-04-01 NOTE — Progress Notes (Signed)
PROGRESS NOTE   Jared [name]  YQI:[id number] DOB: 1960-10-26 DOA: 03/29/2020 PCP: [name], NP   Chief Complaint  Patient presents with  . Weakness   Brief Admission History:  59 y.o.malewith medical history significant foranxiety, COPD, CAD/prior NSTEMI, GERD, OSA, hypertension, CHF, and morbid obesity admitted on 03/29/2020 with acute hypoxic respiratory failure secondary to Covid PNA.   Assessment & Plan:   Active Problems:   Obstructive sleep apnea   GERD   Dyslipidemia   Morbid obesity (HCC)   Pneumonia due to COVID-19 virus   Acute respiratory failure with hypoxia (HCC)   Prolonged QT interval   CKD (chronic kidney disease) stage 3, GFR 30-59 ml/min (HCC)  1. Acute respiratory failure with hypoxia secondary to Covid 19 pneumonia - Pt slowly improving. Oxygen is slowly being weaned down as tolerated.  Continue current management.  If stable, likely DC home 10/14.   2. Morbid obesity and OSA - continue nightly CPAP as ordered.  3. CAD - continue aspirin, lipitor and metoprolol.  4. Stage 3a CKD - stable.    DVT prophylaxis:  SCD/heparin Code Status:  Full  Family Communication:  Disposition: home tomorrow   Status is: Inpatient  Remains inpatient appropriate because:IV treatments appropriate due to intensity of illness or inability to take PO and Inpatient level of care appropriate due to severity of illness  Dispo: The patient is from: Home              Anticipated d/c is to: Home              Anticipated d/c date is: 1 day              Patient currently is not medically stable to d/c.  consultants:   n/a  Procedures:   n/a  Antimicrobials:    Subjective: Pt says he is really starting to feel better today.  Not ambulating very well but that is his baseline.   Objective: Vitals:   03/31/20 2135 03/31/20 2138 04/01/20 0553 04/01/20 0748  BP: (!) 165/88 (!) 165/88 134/74   Pulse: 76 80 87   Resp:  18 17   Temp:  (!) 97.5 F (36.4 C)  97.7 F (36.5 C)   TempSrc:      SpO2:  94% 95% 95%  Weight:      Height:        Intake/Output Summary (Last 24 hours) at 04/01/2020 1248 Last data filed at 04/01/2020 0943 Gross per 24 hour  Intake 718.97 ml  Output --  Net 718.97 ml   Filed Weights   03/30/20 1100  Weight: (!) 204.1 kg   Examination:  General exam: Appears calm and comfortable  Respiratory system: No increased work of breathing.  Cardiovascular system: normal S1 & S2 heard, RRR. No JVD, murmurs, rubs, gallops or clicks. No pedal edema. Gastrointestinal system: Abdomen is nondistended, soft and nontender. No organomegaly or masses felt. Normal bowel sounds heard. Central nervous system: Alert and oriented. No focal neurological deficits. Extremities: Symmetric 5 x 5 power. Skin: No rashes, lesions or ulcers Psychiatry: Judgement and insight appear normal. Mood & affect appropriate.   Data Reviewed: I have personally reviewed following labs and imaging studies  CBC: Recent Labs  Lab 03/29/20 1315 03/30/20 0612 03/31/20 0834 04/01/20 0653  WBC 7.1 6.0 11.9* 12.9*  NEUTROABS  --  5.2 10.5* 11.5*  HGB 13.4 13.6 13.6 13.6  HCT 39.8 39.9 40.6 41.5  MCV 89.2 87.3 87.9 89.2  PLT  249 284 378 431*    Basic Metabolic Panel: Recent Labs  Lab 03/29/20 1315 03/30/20 0612 03/31/20 0834 04/01/20 0653  NA 134* 135 136 137  K 4.1 3.5 3.4* 4.2  CL 101 104 102 102  CO2 22 19* 22 24  GLUCOSE 108* 181* 177* 171*  BUN 13 22 34* 41*  CREATININE 1.47* 1.41* 1.60* 1.66*  CALCIUM 8.6* 8.6* 8.9 9.0  MG  --  1.8 2.1 2.4  PHOS  --  1.5* 2.1* 2.6    GFR: Estimated Creatinine Clearance: 81.1 mL/min (A) (by C-G formula based on SCr of 1.66 mg/dL (H)).  Liver Function Tests: Recent Labs  Lab 03/30/20 0612 03/31/20 0834 04/01/20 0653  AST 137* 145* 124*  ALT 64* 82* 83*  ALKPHOS 37* 40 45  BILITOT 1.1 1.1 1.1  PROT 7.9 7.8 7.9  ALBUMIN 3.2* 3.4* 3.4*    CBG: Recent Labs  Lab 03/31/20 1302  03/31/20 1657 03/31/20 2142 04/01/20 0800 04/01/20 1151  GLUCAP 153* 213* 188* 156* 173*    Recent Results (from the past 240 hour(s))  Respiratory Panel by RT PCR (Flu A&B, Covid) - Nasopharyngeal Swab     Status: Abnormal   Collection Time: 03/29/20  1:02 PM   Specimen: Nasopharyngeal Swab  Result Value Ref Range Status   SARS Coronavirus 2 by RT PCR POSITIVE (A) NEGATIVE Final    Comment: RESULT CALLED TO, READ BACK BY AND VERIFIED WITH: HOLCULM R. @ 1435 ON 101021 BY HENDERSON L. (NOTE) SARS-CoV-2 target nucleic acids are DETECTED.  SARS-CoV-2 RNA is generally detectable in upper respiratory specimens  during the acute phase of infection. Positive results are indicative of the presence of the identified virus, but do not rule out bacterial infection or co-infection with other pathogens not detected by the test. Clinical correlation with patient history and other diagnostic information is necessary to determine patient infection status. The expected result is Negative.  Fact Sheet for Patients:  https://www.moore.com/  Fact Sheet for Healthcare Providers: https://www.young.biz/  This test is not yet approved or cleared by the [place] FDA and  has been authorized for detection and/or diagnosis of SARS-CoV-2 by FDA under an Emergency Use Authorization (EUA).  This EUA will remain in effect (meaning this tes t can be used) for the duration of  the COVID-19 declaration under Section 564(b)(1) of the Act, 21 U.S.C. section 360bbb-3(b)(1), unless the authorization is terminated or revoked sooner.      Influenza A by PCR NEGATIVE NEGATIVE Final   Influenza B by PCR NEGATIVE NEGATIVE Final    Comment: (NOTE) The Xpert Xpress SARS-CoV-2/FLU/RSV assay is intended as an aid in  the diagnosis of influenza from Nasopharyngeal swab specimens and  should not be used as a sole basis for treatment. Nasal washings and  aspirates are  unacceptable for Xpert Xpress SARS-CoV-2/FLU/RSV  testing.  Fact Sheet for Patients: https://www.moore.com/  Fact Sheet for Healthcare Providers: https://www.young.biz/  This test is not yet approved or cleared by the [place] FDA and  has been authorized for detection and/or diagnosis of SARS-CoV-2 by  FDA under an Emergency Use Authorization (EUA). This EUA will remain  in effect (meaning this test can be used) for the duration of the  Covid-19 declaration under Section 564(b)(1) of the Act, 21  U.S.C. section 360bbb-3(b)(1), unless the authorization is  terminated or revoked. Performed at [facility], [address] [place], [place] [address]   Blood Culture (routine x 2)     Status: None (  Preliminary result)   Collection Time: 03/29/20  9:16 PM   Specimen: BLOOD RIGHT HAND  Result Value Ref Range Status   Specimen Description BLOOD RIGHT HAND  Final   Special Requests   Final    BOTTLES DRAWN AEROBIC AND ANAEROBIC Blood Culture adequate volume   Culture   Final    NO GROWTH 3 DAYS Performed at [facility], [address] [place], [place] [address]    Report Status PENDING  Incomplete  Blood Culture (routine x 2)     Status: None (Preliminary result)   Collection Time: 03/29/20  9:30 PM   Specimen: Left Antecubital; Blood  Result Value Ref Range Status   Specimen Description LEFT ANTECUBITAL  Final   Special Requests   Final    BOTTLES DRAWN AEROBIC AND ANAEROBIC Blood Culture adequate volume   Culture   Final    NO GROWTH 2 DAYS Performed at [facility], [address] [place], [place] [address]    Report Status PENDING  Incomplete     Radiology Studies: No results found.  Scheduled Meds: . albuterol  2 puff Inhalation TID  . vitamin C  500 mg Oral Daily  . atorvastatin  80 mg Oral q1800  . dextromethorphan-guaiFENesin  1 tablet Oral BID  . heparin  5,000 Units Subcutaneous Q8H  . insulin aspart  0-5 Units  Subcutaneous QHS  . insulin aspart  0-9 Units Subcutaneous TID WC  . metoprolol tartrate  12.5 mg Oral BID  . pantoprazole  40 mg Oral Daily  . [START ON 04/02/2020] predniSONE  50 mg Oral Daily  . zinc sulfate  220 mg Oral Daily   Continuous Infusions: . remdesivir 100 mg in NS 100 mL 100 mg (04/01/20 0943)     LOS: 3 days   Time spent: 35 mins   Sophiya Morello Laural Benes, MD How to contact the [facility] Attending or Consulting provider 7A - 7P or covering provider during after hours 7P -7A, for this patient?  1. Check the care team in [facility] and look for a) attending/consulting [facility] provider listed and b) the [facility] team listed 2. Log into [email or web address] and use [place]'s universal password to access. If you do not have the password, please contact the hospital operator. 3. Locate the [facility] provider you are looking for under [facility] and page to a number that you can be directly reached. 4. If you still have difficulty reaching the provider, please page the [facility] (Director on Call) for the Hospitalists listed on amion for assistance.  04/01/2020, 12:48 PM

## 2020-04-01 NOTE — Evaluation (Signed)
Physical Therapy Evaluation Patient Details Name: Jared [name] MRN: [id number] DOB: 04-Mar-1961 Today's Date: 04/01/2020   History of Present Illness  Jared [name] is a 59 y.o. male with medical history significant for anxiety, COPD, CAD, GERD, OSA, hypertension, CHF, NSTEMI who presents to the the emergency department due to 2-week onset of flulike symptoms including cough with occasional production of white mucus, sore throat, headaches, body aches and generalized weakness with loss of taste and smell and decreased appetite.  He states that he had contact with 2 family members with COVID-19 positive prior to onset of his symptoms.  He noted shortness of breath which has worsened within last couple of days and was associated with generalized weakness and fatigue, this was associated with lightheadedness on ambulation.  He checked his O2 sat at home due to his symptoms and noted it to be 88%, so he decided to go to the ED for further evaluation and management.    Clinical Impression  Patient functioning at baseline for functional mobility and gait.  Patient encouraged to ambulate in room as tolerated for length of stay.  Plan:  Patient discharged from physical therapy to care of nursing for ambulation daily as tolerated for length of stay.     Follow Up Recommendations No PT follow up;Supervision - Intermittent    Equipment Recommendations  None recommended by PT    Recommendations for Other Services       Precautions / Restrictions Precautions Precautions: None Restrictions Weight Bearing Restrictions: No      Mobility  Bed Mobility Overal bed mobility: Modified Independent                Transfers Overall transfer level: Modified independent Equipment used: Rolling walker (2 wheeled)                Ambulation/Gait Ambulation/Gait assistance: Modified independent (Device/Increase time) Gait Distance (Feet): 22 Feet Assistive device: Rolling walker (2  wheeled) Gait Pattern/deviations: Decreased step length - right;Decreased step length - left;Decreased stride length Gait velocity: decreased   General Gait Details: slightly labored cadence without loss of balance, limited mostly due to fatigue while on 4.5 LPM O2 with SpO2 at 88%  Stairs            Wheelchair Mobility    Modified Rankin (Stroke Patients Only)       Balance Overall balance assessment: Needs assistance Sitting-balance support: Feet supported;No upper extremity supported Sitting balance-Leahy Scale: Good Sitting balance - Comments: seated at EOB   Standing balance support: During functional activity;Bilateral upper extremity supported Standing balance-Leahy Scale: Fair Standing balance comment: fair/good using RW                             Pertinent Vitals/Pain Pain Assessment: No/denies pain    Home Living Family/patient expects to be discharged to:: Private residence Living Arrangements: Spouse/significant other Available Help at Discharge: Family;Available PRN/intermittently Type of Home: House Home Access: Ramped entrance     Home Layout: One level Home Equipment: Insurance underwriter - 2 wheels Additional Comments: Patient states he does go into shower, "washes himself up in chair"    Prior Function Level of Independence: Independent with assistive device(s)         Comments: household ambulator using RW, uses electric scooter for longer distances     Hand Dominance        Extremity/Trunk Assessment   Upper Extremity Assessment Upper Extremity Assessment: Overall [facility]  for tasks assessed    Lower Extremity Assessment Lower Extremity Assessment: Overall WFL for tasks assessed    Cervical / Trunk Assessment Cervical / Trunk Assessment: Normal  Communication   Communication: No difficulties  Cognition Arousal/Alertness: Awake/alert Behavior During Therapy: WFL for tasks assessed/performed Overall Cognitive  Status: Within Functional Limits for tasks assessed                                        General Comments      Exercises     Assessment/Plan    PT Assessment Patent does not need any further PT services  PT Problem List         PT Treatment Interventions      PT Goals (Current goals can be found in the Care Plan section)  Acute Rehab PT Goals Patient Stated Goal: return home with family to assist PT Goal Formulation: With patient Time For Goal Achievement: 04/01/20 Potential to Achieve Goals: Good    Frequency     Barriers to discharge        Co-evaluation               AM-PAC PT "6 Clicks" Mobility  Outcome Measure Help needed turning from your back to your side while in a flat bed without using bedrails?: None Help needed moving from lying on your back to sitting on the side of a flat bed without using bedrails?: None Help needed moving to and from a bed to a chair (including a wheelchair)?: None Help needed standing up from a chair using your arms (e.g., wheelchair or bedside chair)?: None Help needed to walk in hospital room?: A Little Help needed climbing 3-5 steps with a railing? : A Little 6 Click Score: 22    End of Session Equipment Utilized During Treatment: Oxygen Activity Tolerance: Patient tolerated treatment well;Patient limited by fatigue Patient left: in bed;with call bell/phone within reach Nurse Communication: Mobility status PT Visit Diagnosis: Unsteadiness on feet (R26.81);Other abnormalities of gait and mobility (R26.89);Muscle weakness (generalized) (M62.81)    Time: 6063-0160 PT Time Calculation (min) (ACUTE ONLY): 18 min   Charges:   PT Evaluation $PT Eval Low Complexity: 1 Low PT Treatments $Therapeutic Activity: 8-22 mins        2:30 PM, 04/01/20 Ocie Bob, MPT Physical Therapist with [facility] [phone] office [phone] mobile phone

## 2020-04-01 NOTE — Plan of Care (Signed)

## 2020-04-02 LAB — CBC WITH DIFFERENTIAL/PLATELET
Abs Immature Granulocytes: 0.21 10*3/uL — ABNORMAL HIGH (ref 0.00–0.07)
Basophils Absolute: 0 10*3/uL (ref 0.0–0.1)
Basophils Relative: 0 %
Eosinophils Absolute: 0 10*3/uL (ref 0.0–0.5)
Eosinophils Relative: 0 %
HCT: 40.6 % (ref 39.0–52.0)
Hemoglobin: 13.2 g/dL (ref 13.0–17.0)
Immature Granulocytes: 2 %
Lymphocytes Relative: 7 %
Lymphs Abs: 0.8 10*3/uL (ref 0.7–4.0)
MCH: 29.2 pg (ref 26.0–34.0)
MCHC: 32.5 g/dL (ref 30.0–36.0)
MCV: 89.8 fL (ref 80.0–100.0)
Monocytes Absolute: 0.9 10*3/uL (ref 0.1–1.0)
Monocytes Relative: 8 %
Neutro Abs: 9.1 10*3/uL — ABNORMAL HIGH (ref 1.7–7.7)
Neutrophils Relative %: 83 %
Platelets: 382 10*3/uL (ref 150–400)
RBC: 4.52 MIL/uL (ref 4.22–5.81)
RDW: 14.7 % (ref 11.5–15.5)
WBC: 11.1 10*3/uL — ABNORMAL HIGH (ref 4.0–10.5)
nRBC: 0 % (ref 0.0–0.2)

## 2020-04-02 LAB — FERRITIN: Ferritin: 509 ng/mL — ABNORMAL HIGH (ref 24–336)

## 2020-04-02 LAB — COMPREHENSIVE METABOLIC PANEL
ALT: 71 U/L — ABNORMAL HIGH (ref 0–44)
AST: 100 U/L — ABNORMAL HIGH (ref 15–41)
Albumin: 3.1 g/dL — ABNORMAL LOW (ref 3.5–5.0)
Alkaline Phosphatase: 43 U/L (ref 38–126)
Anion gap: 10 (ref 5–15)
BUN: 41 mg/dL — ABNORMAL HIGH (ref 8–23)
CO2: 26 mmol/L (ref 22–32)
Calcium: 8.9 mg/dL (ref 8.9–10.3)
Chloride: 102 mmol/L (ref 98–111)
Creatinine, Ser: 1.62 mg/dL — ABNORMAL HIGH (ref 0.61–1.24)
GFR, Estimated: 45 mL/min — ABNORMAL LOW (ref >60)
Glucose, Bld: 146 mg/dL — ABNORMAL HIGH (ref 70–99)
Potassium: 4.3 mmol/L (ref 3.5–5.1)
Sodium: 138 mmol/L (ref 135–145)
Total Bilirubin: 1 mg/dL (ref 0.3–1.2)
Total Protein: 7.5 g/dL (ref 6.5–8.1)

## 2020-04-02 LAB — GLUCOSE, CAPILLARY
Glucose-Capillary: 139 mg/dL — ABNORMAL HIGH (ref 70–99)
Glucose-Capillary: 132 mg/dL — ABNORMAL HIGH (ref 70–99)

## 2020-04-02 LAB — MAGNESIUM: Magnesium: 2.4 mg/dL (ref 1.7–2.4)

## 2020-04-02 LAB — C-REACTIVE PROTEIN: CRP: 2.4 mg/dL — ABNORMAL HIGH (ref <1.0)

## 2020-04-02 LAB — PHOSPHORUS: Phosphorus: 3.4 mg/dL (ref 2.5–4.6)

## 2020-04-02 LAB — D-DIMER, QUANTITATIVE: D-Dimer, Quant: 0.34 ug/mL-FEU (ref 0.00–0.50)

## 2020-04-02 MED ORDER — GUAIFENESIN-DM 100-10 MG/5ML PO SYRP: 10.0000 mL | ORAL_SOLUTION | ORAL | 0 refills | Status: DC | PRN

## 2020-04-02 MED ORDER — ZINC SULFATE 220 (50 ZN) MG PO CAPS: 220.0000 mg | ORAL_CAPSULE | Freq: Every day | ORAL | 0 refills | Status: AC

## 2020-04-02 MED ORDER — ASCORBIC ACID 500 MG PO TABS: 500.0000 mg | ORAL_TABLET | Freq: Every day | ORAL | 0 refills | Status: AC

## 2020-04-02 MED ORDER — DEXAMETHASONE 6 MG PO TABS: 6.0000 mg | ORAL_TABLET | Freq: Every day | ORAL | 0 refills | Status: AC

## 2020-04-02 NOTE — Discharge Instructions (Signed)
10 Things You Can Do to Manage Your COVID-19 Symptoms at Home If you have possible or confirmed COVID-19: 1. Stay home from work and school. And stay away from other public places. If you must go out, avoid using any kind of public transportation, ridesharing, or taxis. 2. Monitor your symptoms carefully. If your symptoms get worse, call your healthcare provider immediately. 3. Get rest and stay hydrated. 4. If you have a medical appointment, call the healthcare provider ahead of time and tell them that you have or may have COVID-19. 5. For medical emergencies, call 911 and notify the dispatch personnel that you have or may have COVID-19. 6. Cover your cough and sneezes with a tissue or use the inside of your elbow. 7. Wash your hands often with soap and water for at least 20 seconds or clean your hands with an alcohol-based hand sanitizer that contains at least 60% alcohol. 8. As much as possible, stay in a specific room and away from other people in your home. Also, you should use a separate bathroom, if available. If you need to be around other people in or outside of the home, wear a mask. 9. Avoid sharing personal items with other people in your household, like dishes, towels, and bedding. 10. Clean all surfaces that are touched often, like counters, tabletops, and doorknobs. Use household cleaning sprays or wipes according to the label instructions. cdc.gov/coronavirus 12/19/2018 This information is not intended to replace advice given to you by your health care provider. Make sure you discuss any questions you have with your health care provider. Document Revised: 05/23/2019 Document Reviewed: 05/23/2019 Elsevier Patient Education  2020 Elsevier Inc.  COVID-19: How to Protect Yourself and Others Know how it spreads  There is currently no vaccine to prevent coronavirus disease 2019 (COVID-19).  The best way to prevent illness is to avoid being exposed to this virus.  The virus is  thought to spread mainly from person-to-person. ? Between people who are in close contact with one another (within about 6 feet). ? Through respiratory droplets produced when an infected person coughs, sneezes or talks. ? These droplets can land in the mouths or noses of people who are nearby or possibly be inhaled into the lungs. ? COVID-19 may be spread by people who are not showing symptoms. Everyone should Clean your hands often  Wash your hands often with soap and water for at least 20 seconds especially after you have been in a public place, or after blowing your nose, coughing, or sneezing.  If soap and water are not readily available, use a hand sanitizer that contains at least 60% alcohol. Cover all surfaces of your hands and rub them together until they feel dry.  Avoid touching your eyes, nose, and mouth with unwashed hands. Avoid close contact  Limit contact with others as much as possible.  Avoid close contact with people who are sick.  Put distance between yourself and other people. ? Remember that some people without symptoms may be able to spread virus. ? This is especially important for people who are at higher risk of getting very sick.www.cdc.gov/coronavirus/2019-ncov/need-extra-precautions/people-at-higher-risk.html Cover your mouth and nose with a mask when around others  You could spread COVID-19 to others even if you do not feel sick.  Everyone should wear a mask in public settings and when around people not living in their household, especially when social distancing is difficult to maintain. ? Masks should not be placed on young children under age 2, anyone who   who has trouble breathing, or is unconscious, incapacitated or otherwise unable to remove the mask without assistance.  The mask is meant to protect other people in case you are infected.  Do NOT use a facemask meant for a Research scientist (physical sciences).  Continue to keep about 6 feet between yourself and others. The  mask is not a substitute for social distancing. Cover coughs and sneezes  Always cover your mouth and nose with a tissue when you cough or sneeze or use the inside of your elbow.  Throw used tissues in the trash.  Immediately wash your hands with soap and water for at least 20 seconds. If soap and water are not readily available, clean your hands with a hand sanitizer that contains at least 60% alcohol. Clean and disinfect  Clean AND disinfect frequently touched surfaces daily. This includes tables, doorknobs, light switches, countertops, handles, desks, phones, keyboards, toilets, faucets, and sinks. [email or web address].[email or web address]  If surfaces are dirty, clean them: Use detergent or soap and water prior to disinfection.  Then, use a household disinfectant. You can see a list of EPA-registered household disinfectants here. [email or web address] 02/20/2019 This information is not intended to replace advice given to you by your health care provider. Make sure you discuss any questions you have with your health care provider. Document Revised: 02/28/2019 Document Reviewed: 12/27/2018 Elsevier Patient Education  2020 Elsevier Inc.   COVID-19: Quarantine vs. Isolation QUARANTINE keeps someone who was in close contact with someone who has COVID-19 away from others. If you had close contact with a person who has COVID-19  Stay home until 14 days after your last contact.  Check your temperature twice a day and watch for symptoms of COVID-19.  If possible, stay away from people who are at higher-risk for getting very sick from COVID-19. ISOLATION keeps someone who is sick or tested positive for COVID-19 without symptoms away from others, even in their own home. If you are sick and think or know you have COVID-19  Stay home until after ? At least 10 days since symptoms first appeared and ? At least 24 hours with no fever without  fever-reducing medication and ? Symptoms have improved If you tested positive for COVID-19 but do not have symptoms  Stay home until after ? 10 days have passed since your positive test If you live with others, stay in a specific "sick room" or area and away from other people or animals, including pets. Use a separate bathroom, if available. [email or web address] 01/07/2019 This information is not intended to replace advice given to you by your health care provider. Make sure you discuss any questions you have with your health care provider. Document Revised: 05/23/2019 Document Reviewed: 05/23/2019 Elsevier Patient Education  2020 [employer].   COVID-19 Frequently Asked Questions COVID-19 (coronavirus disease) is an infection that is caused by a large family of viruses. Some viruses cause illness in people and others cause illness in animals like camels, cats, and bats. In some cases, the viruses that cause illness in animals can spread to humans. Where did the coronavirus come from? In December 2019, [place] told the [employer] [facility]) of several cases of lung disease (human respiratory illness). These cases were linked to an open seafood and livestock market in the city of [place]. The link to the seafood and livestock market suggests that the virus may have spread from animals to humans. However, since that first outbreak in December, the virus has also been shown to spread from person to person. What  is the name of the disease and the virus? Disease name Early on, this disease was called novel coronavirus. This is because scientists determined that the disease was caused by a new (novel) respiratory virus. The World Health Organization [facility]) has now named the disease COVID-19, or coronavirus disease. Virus name The virus that causes the disease is called severe acute respiratory syndrome coronavirus 2 (SARS-CoV-2). More information on disease and virus naming World Health  Organization [facility]): www.who.int/emergencies/diseases/novel-coronavirus-2019/technical-guidance/naming-the-coronavirus-disease-(covid-2019)-and-the-virus-that-causes-it Who is at risk for complications from coronavirus disease? Some people may be at higher risk for complications from coronavirus disease. This includes older adults and people who have chronic diseases, such as heart disease, diabetes, and lung disease. If you are at higher risk for complications, take these extra precautions:  Stay home as much as possible.  Avoid social gatherings and travel.  Avoid close contact with others. Stay at least 6 ft (2 m) away from others, if possible.  Wash your hands often with soap and water for at least 20 seconds.  Avoid touching your face, mouth, nose, or eyes.  Keep supplies on hand at home, such as food, medicine, and cleaning supplies.  If you must go out in public, wear a cloth face covering or face mask. Make sure your mask covers your nose and mouth. How does coronavirus disease spread? The virus that causes coronavirus disease spreads easily from person to person (is contagious). You may catch the virus by:  Breathing in droplets from an infected person. Droplets can be spread by a person breathing, speaking, singing, coughing, or sneezing.  Touching something, like a table or a doorknob, that was exposed to the virus (contaminated) and then touching your mouth, nose, or eyes. Can I get the virus from touching surfaces or objects? There is still a lot that we do not know about the virus that causes coronavirus disease. Scientists are basing a lot of information on what they know about similar viruses, such as:  Viruses cannot generally survive on surfaces for long. They need a human body (host) to survive.  It is more likely that the virus is spread by close contact with people who are sick (direct contact), such as through: ? Shaking hands or hugging. ? Breathing in respiratory  droplets that travel through the air. Droplets can be spread by a person breathing, speaking, singing, coughing, or sneezing.  It is less likely that the virus is spread when a person touches a surface or object that has the virus on it (indirect contact). The virus may be able to enter the body if the person touches a surface or object and then touches his or her face, eyes, nose, or mouth. Can a person spread the virus without having symptoms of the disease? It may be possible for the virus to spread before a person has symptoms of the disease, but this is most likely not the main way the virus is spreading. It is more likely for the virus to spread by being in close contact with people who are sick and breathing in the respiratory droplets spread by a person breathing, speaking, singing, coughing, or sneezing. What are the symptoms of coronavirus disease? Symptoms vary from person to person and can range from mild to severe. Symptoms may include:  Fever or chills.  Cough.  Difficulty breathing or feeling short of breath.  Headaches, body aches, or muscle aches.  Runny or stuffy (congested) nose.  Sore throat.  New loss of taste or smell.  Nausea, vomiting, or  diarrhea. These symptoms can appear anywhere from 2 to 14 days after you have been exposed to the virus. Some people may not have any symptoms. If you develop symptoms, call your health care provider. People with severe symptoms may need hospital care. Should I be tested for this virus? Your health care provider will decide whether to test you based on your symptoms, history of exposure, and your risk factors. How does a health care provider test for this virus? Health care providers will collect samples to send for testing. Samples may include:  Taking a swab of fluid from the back of your nose and throat, your nose, or your throat.  Taking fluid from the lungs by having you cough up mucus (sputum) into a sterile  cup.  Taking a blood sample. Is there a treatment or vaccine for this virus? Currently, there is no vaccine to prevent coronavirus disease. Also, there are no medicines like antibiotics or antivirals to treat the virus. A person who becomes sick is given supportive care, which means rest and fluids. A person may also relieve his or her symptoms by using over-the-counter medicines that treat sneezing, coughing, and runny nose. These are the same medicines that a person takes for the common cold. If you develop symptoms, call your health care provider. People with severe symptoms may need hospital care. What can I do to protect myself and my family from this virus?     You can protect yourself and your family by taking the same actions that you would take to prevent the spread of other viruses. Take the following actions:  Wash your hands often with soap and water for at least 20 seconds. If soap and water are not available, use alcohol-based hand sanitizer.  Avoid touching your face, mouth, nose, or eyes.  Cough or sneeze into a tissue, sleeve, or elbow. Do not cough or sneeze into your hand or the air. ? If you cough or sneeze into a tissue, throw it away immediately and wash your hands.  Disinfect objects and surfaces that you frequently touch every day.  Stay away from people who are sick.  Avoid going out in public, follow guidance from your state and local health authorities.  Avoid crowded indoor spaces. Stay at least 6 ft (2 m) away from others.  If you must go out in public, wear a cloth face covering or face mask. Make sure your mask covers your nose and mouth.  Stay home if you are sick, except to get medical care. Call your health care provider before you get medical care. Your health care provider will tell you how long to stay home.  Make sure your vaccines are up to date. Ask your health care provider what vaccines you need. What should I do if I need to travel? Follow  travel recommendations from your local health authority, the CDC, and WHO. Travel information and advice  Centers for Disease Control and Prevention (CDC): GeminiCard.gl  World Health Organization [facility]): PreviewDomains.se Know the risks and take action to protect your health  You are at higher risk of getting coronavirus disease if you are traveling to areas with an outbreak or if you are exposed to travelers from areas with an outbreak.  Wash your hands often and practice good hygiene to lower the risk of catching or spreading the virus. What should I do if I am sick? General instructions to stop the spread of infection  Wash your hands often with soap and water for  at least 20 seconds. If soap and water are not available, use alcohol-based hand sanitizer.  Cough or sneeze into a tissue, sleeve, or elbow. Do not cough or sneeze into your hand or the air.  If you cough or sneeze into a tissue, throw it away immediately and wash your hands.  Stay home unless you must get medical care. Call your health care provider or local health authority before you get medical care.  Avoid public areas. Do not take public transportation, if possible.  If you can, wear a mask if you must go out of the house or if you are in close contact with someone who is not sick. Make sure your mask covers your nose and mouth. Keep your home clean  Disinfect objects and surfaces that are frequently touched every day. This may include: ? Counters and tables. ? Doorknobs and light switches. ? Sinks and faucets. ? Electronics such as phones, remote controls, keyboards, computers, and tablets.  Wash dishes in hot, soapy water or use a dishwasher. Air-dry your dishes.  Wash laundry in hot water. Prevent infecting other household members  Let healthy household members care for children and pets, if possible. If you have  to care for children or pets, wash your hands often and wear a mask.  Sleep in a different bedroom or bed, if possible.  Do not share personal items, such as razors, toothbrushes, deodorant, combs, brushes, towels, and washcloths. Where to find more information Centers for Disease Control and Prevention (CDC)  Information and news updates: CardRetirement.cz World Health Organization [facility])  Information and news updates: AffordableSalon.es  Coronavirus health topic: https://thompson-craig.com/  Questions and answers on COVID-19: kruiseway.com  Global tracker: who.sprinklr.com American Academy of Pediatrics (AAP)  Information for families: www.healthychildren.org/English/health-issues/conditions/chest-lungs/Pages/2019-Novel-Coronavirus.aspx The coronavirus situation is changing rapidly. Check your local health authority website or the CDC and [facility] websites for updates and news. When should I contact a health care provider?  Contact your health care provider if you have symptoms of an infection, such as fever or cough, and you: ? Have been near anyone who is known to have coronavirus disease. ? Have come into contact with a person who is suspected to have coronavirus disease. ? Have traveled to an area where there is an outbreak of COVID-19. When should I get emergency medical care?  Get help right away by calling your local emergency services (911 in the [place] if you have: ? Trouble breathing. ? Pain or pressure in your chest. ? Confusion. ? Blue-tinged lips and fingernails. ? Difficulty waking from sleep. ? Symptoms that get worse. Let the emergency medical personnel know if you think you have coronavirus disease. Summary  A new respiratory virus is spreading from person to person and causing COVID-19 (coronavirus disease).  The virus that causes COVID-19 appears to spread  easily. It spreads from one person to another through droplets from breathing, speaking, singing, coughing, or sneezing.  Older adults and those with chronic diseases are at higher risk of disease. If you are at higher risk for complications, take extra precautions.  There is currently no vaccine to prevent coronavirus disease. There are no medicines, such as antibiotics or antivirals, to treat the virus.  You can protect yourself and your family by washing your hands often, avoiding touching your face, and covering your coughs and sneezes. This information is not intended to replace advice given to you by your health care provider. Make sure you discuss any questions you have with your health care provider.  Document Revised: 04/05/2019 Document Reviewed: 10/02/2018 Elsevier Patient Education  2020 Elsevier Inc.    Home Oxygen Use, Adult When a medical condition keeps you from getting enough oxygen, your health care provider may instruct you to take extra oxygen at home. Your health care provider will let you know:  When to take oxygen.  For how long to take oxygen.  How quickly oxygen should be delivered (flow rate), in liters per minute (LPM or L/M). Home oxygen can be given through:  A mask.  A nasal cannula. This is a device or tube that goes in the nostrils.  A transtracheal catheter. This is a small, flexible tube placed in the trachea.  A tracheostomy. This is a surgically made opening in the trachea. These devices are connected with tubing to an oxygen source, such as:  A tank. Tanks hold oxygen in gas form. They must be replaced when the oxygen is used up.  A liquid oxygen device. This holds oxygen in liquid form. It must be replaced when the oxygen is used up.  An oxygen concentrator machine. This filters oxygen in the room. It uses electricity, so you must have a backup cylinder of oxygen in case the power goes out. Supplies needed: To use oxygen, you will need:  A  mask, nasal cannula, transtracheal catheter, or tracheostomy.  An oxygen tank, a liquid oxygen device, or an oxygen concentrator.  The tape that your health care provider recommends (optional). If you use a transtracheal catheter and your prescribed flow rate is 1 LPM or greater, you will also need a humidifier. Risks and complications  Fire. This can happen if the oxygen is exposed to a heat source, flame, or spark.  Injury to skin. This can happen if liquid oxygen touches your skin.  Organ damage. This can happen if you get too little oxygen. How to use oxygen Your health care provider or a representative from your medical device company will show you how to use your oxygen device. Follow her or his instructions. The instructions may look something like this: 1. Wash your hands. 2. If you use an oxygen concentrator, make sure it is plugged in. 3. Place one end of the tube into the port on the tank, device, or machine. 4. Place the mask over your nose and mouth. Or, place the nasal cannula and secure it with tape if instructed. If you use a tracheostomy or transtracheal catheter, connect it to the oxygen source as directed. 5. Make sure the liter-flow setting on the machine is at the level prescribed by your health care provider. 6. Turn on the machine or adjust the knob on the tank or device to the correct liter-flow setting. 7. When you are done, turn off and unplug the machine, or turn the knob to OFF. How to clean and care for the oxygen supplies Nasal cannula  Clean it with a warm, wet cloth daily or as needed.  Wash it with a liquid soap once a week.  Rinse it thoroughly once or twice a week.  Replace it every 2-4 weeks.  If you have an infection, such as a cold or pneumonia, change the cannula when you get better. Mask  Replace it every 2-4 weeks.  If you have an infection, such as a cold or pneumonia, change the mask when you get better. Humidifier bottle  Wash the  bottle between each refill: ? Wash it with soap and warm water. ? Rinse it thoroughly. ? Disinfect it and its top. ?  Air-dry it.  Make sure it is dry before you refill it. Oxygen concentrator  Clean the air filter at least twice a week according to directions from your home medical equipment and service company.  Wipe down the cabinet every day. To do this: ? Unplug the unit. ? Wipe down the cabinet with a damp cloth. ? Dry the cabinet. Other equipment  Change any extra tubing every 1-3 months.  Follow instructions from your health care provider about taking care of any other equipment. Safety tips Fire safety tips   Keep your oxygen and oxygen supplies at least 5 ft away from sources of heat, flames, and sparks at all times.  Do not allow smoking near your oxygen. Put up "no smoking" signs in your home. Avoid smoking areas when in public.  Do not use materials that can burn (are flammable) while you use oxygen.  When you go to a restaurant with portable oxygen, ask to be seated in the nonsmoking section.  Keep a Government social research officer close by. Let your fire department know that you have oxygen in your home.  Test your home smoke detectors regularly. Traveling  Secure your oxygen tank in the vehicle so that it does not move around. Follow instructions from your medical device company about how to safely secure your tank.  Make sure you have enough oxygen for the amount of time you will be away from home.  If you are planning air travel, contact the airline to find out if they allow the use of an approved portable oxygen concentrator. You may also need documents from your health care provider and medical device company before you travel. General safety tips  If you use an oxygen cylinder, make sure it is in a stand or secured to an object that will not move (fixed object).  If you use liquid oxygen, make sure its container is kept upright.  If you use an oxygen  concentrator: ? Catering manager company. Make sure you are given priority service in the event that your power goes out. ? Avoid using extension cords, if possible. Follow these instructions at home:  Use oxygen only as told by your health care provider.  Do not use alcohol or other drugs that make you relax (sedating drugs) unless instructed. They can slow down your breathing rate and make it hard to get in enough oxygen.  Know how and when to order a refill of oxygen.  Always keep a spare tank of oxygen. Plan ahead for holidays when you may not be able to get a prescription filled.  Use water-based lubricants on your lips or nostrils. Do not use oil-based products like petroleum jelly.  To prevent skin irritation on your cheeks or behind your ears, tuck some gauze under the tubing. Contact a health care provider if:  You get headaches often.  You have shortness of breath.  You have a lasting cough.  You have anxiety.  You are sleepy all the time.  You develop an illness that affects your breathing.  You cannot exercise at your regular level.  You are restless.  You have difficult or irregular breathing, and it is getting worse.  You have a fever.  You have persistent redness under your nose. Get help right away if:  You are confused.  You have blue lips or fingernails.  You are struggling to breathe. Summary  Your health care provider or a representative from your medical device company will show you how to use your  oxygen device. Follow her or his instructions.  If you use an oxygen concentrator, make sure it is plugged in.  Make sure the liter-flow setting on the machine is at the level prescribed by your health care provider.  Keep your oxygen and oxygen supplies at least 5 ft away from sources of heat, flames, and sparks at all times. This information is not intended to replace advice given to you by your health care provider. Make sure you discuss any  questions you have with your health care provider. Document Revised: 11/23/2017 Document Reviewed: 12/29/2015 Elsevier Patient Education  2020 [employer]   IMPORTANT INFORMATION: PAY CLOSE ATTENTION   PHYSICIAN DISCHARGE INSTRUCTIONS  Follow with Primary care provider  [name], NP  and other consultants as instructed by your Hospitalist Physician  SEEK MEDICAL CARE OR RETURN TO EMERGENCY ROOM IF SYMPTOMS COME BACK, WORSEN OR NEW PROBLEM DEVELOPS   Please note: You were cared for by a hospitalist during your hospital stay. Every effort will be made to forward records to your primary care provider.  You can request that your primary care provider send for your hospital records if they have not received them.  Once you are discharged, your primary care physician will handle any further medical issues. Please note that NO REFILLS for any discharge medications will be authorized once you are discharged, as it is imperative that you return to your primary care physician (or establish a relationship with a primary care physician if you do not have one) for your post hospital discharge needs so that they can reassess your need for medications and monitor your lab values.  Please get a complete blood count and chemistry panel checked by your Primary MD at your next visit, and again as instructed by your Primary MD.  Get Medicines reviewed and adjusted: Please take all your medications with you for your next visit with your Primary MD  Laboratory/radiological data: Please request your Primary MD to go over all hospital tests and procedure/radiological results at the follow up, please ask your primary care provider to get all Hospital records sent to his/her office.  In some cases, they will be blood work, cultures and biopsy results pending at the time of your discharge. Please request that your primary care provider follow up on these results.  If you are diabetic, please bring your  blood sugar readings with you to your follow up appointment with primary care.    Please call and make your follow up appointments as soon as possible.    Also Note the following: If you experience worsening of your admission symptoms, develop shortness of breath, life threatening emergency, suicidal or homicidal thoughts you must seek medical attention immediately by calling 911 or calling your MD immediately  if symptoms less severe.  You must read complete instructions/literature along with all the possible adverse reactions/side effects for all the Medicines you take and that have been prescribed to you. Take any new Medicines after you have completely understood and accpet all the possible adverse reactions/side effects.   Do not drive when taking Pain medications or sleeping medications (Benzodiazepines)  Do not take more than prescribed Pain, Sleep and Anxiety Medications. It is not advisable to combine anxiety,sleep and pain medications without talking with your primary care practitioner  Special Instructions: If you have smoked or chewed Tobacco  in the last 2 yrs please stop smoking, stop any regular Alcohol  and or any Recreational drug use.  Wear Seat belts while  driving.  Do not drive if taking any narcotic, mind altering or controlled substances or recreational drugs or alcohol.

## 2020-04-02 NOTE — TOC Progression Note (Signed)
Transition of Care [facility]) - Progression Note    Patient Details  Name: Jared [name] MRN: [id number] Date of Birth: August 30, 1960  [facility] CM/SW Contact  Annice Needy, LCSW Phone Number: 04/02/2020, 11:27 AM  Clinical Narrative:    Patient in need of home oxygen and hospital bed. DME providers discussed. Referral for DME made to [facility].    Expected Discharge Plan: Home/Self Care Barriers to Discharge: No Barriers Identified  Expected Discharge Plan and Services Expected Discharge Plan: Home/Self Care         Expected Discharge Date: 04/02/20               DME Arranged: Oxygen, Hospital bed DME Agency: [facility] Date DME Agency Contacted: 04/02/20 Time DME Agency Contacted: 1125 Representative spoke with at DME Agency: [facility]             Social Determinants of Health (SDOH) Interventions    Readmission Risk Interventions No flowsheet data found.

## 2020-04-02 NOTE — Plan of Care (Signed)
  Problem: Education: Goal: Knowledge of risk factors and measures for prevention of condition will improve 04/02/2020 1203 by Liborio Nixon, RN Outcome: Progressing 04/02/2020 0738 by Liborio Nixon, RN Outcome: Progressing   Problem: Coping: Goal: Psychosocial and spiritual needs will be supported 04/02/2020 1203 by Liborio Nixon, RN Outcome: Progressing 04/02/2020 0738 by Liborio Nixon, RN Outcome: Progressing   Problem: Respiratory: Goal: Will maintain a patent airway 04/02/2020 1203 by Liborio Nixon, RN Outcome: Progressing 04/02/2020 0738 by Liborio Nixon, RN Outcome: Progressing Goal: Complications related to the disease process, condition or treatment will be avoided or minimized 04/02/2020 1203 by Liborio Nixon, RN Outcome: Progressing 04/02/2020 0738 by Liborio Nixon, RN Outcome: Progressing   Problem: Education: Goal: Knowledge of General Education information will improve Description: Including pain rating scale, medication(s)/side effects and non-pharmacologic comfort measures 04/02/2020 1203 by Liborio Nixon, RN Outcome: Progressing 04/02/2020 0738 by Liborio Nixon, RN Outcome: Progressing   Problem: Health Behavior/Discharge Planning: Goal: Ability to manage health-related needs will improve 04/02/2020 1203 by Liborio Nixon, RN Outcome: Progressing 04/02/2020 0738 by Liborio Nixon, RN Outcome: Progressing   Problem: Clinical Measurements: Goal: Ability to maintain clinical measurements within normal limits will improve 04/02/2020 1203 by Liborio Nixon, RN Outcome: Progressing 04/02/2020 0738 by Liborio Nixon, RN Outcome: Progressing Goal: Will remain free from infection 04/02/2020 1203 by Liborio Nixon, RN Outcome: Progressing 04/02/2020 0738 by Liborio Nixon, RN Outcome: Progressing Goal: Diagnostic test results will improve 04/02/2020 1203 by Liborio Nixon, RN Outcome:  Progressing 04/02/2020 0738 by Liborio Nixon, RN Outcome: Progressing Goal: Respiratory complications will improve 04/02/2020 1203 by Liborio Nixon, RN Outcome: Progressing 04/02/2020 0738 by Liborio Nixon, RN Outcome: Progressing Goal: Cardiovascular complication will be avoided 04/02/2020 1203 by Liborio Nixon, RN Outcome: Progressing 04/02/2020 0738 by Liborio Nixon, RN Outcome: Progressing   Problem: Activity: Goal: Risk for activity intolerance will decrease 04/02/2020 1203 by Liborio Nixon, RN Outcome: Progressing 04/02/2020 0738 by Liborio Nixon, RN Outcome: Progressing   Problem: Nutrition: Goal: Adequate nutrition will be maintained 04/02/2020 1203 by Liborio Nixon, RN Outcome: Progressing 04/02/2020 0738 by Liborio Nixon, RN Outcome: Progressing   Problem: Coping: Goal: Level of anxiety will decrease 04/02/2020 1203 by Liborio Nixon, RN Outcome: Progressing 04/02/2020 0738 by Liborio Nixon, RN Outcome: Progressing   Problem: Elimination: Goal: Will not experience complications related to bowel motility 04/02/2020 1203 by Liborio Nixon, RN Outcome: Progressing 04/02/2020 0738 by Liborio Nixon, RN Outcome: Progressing Goal: Will not experience complications related to urinary retention 04/02/2020 1203 by Liborio Nixon, RN Outcome: Progressing 04/02/2020 0738 by Liborio Nixon, RN Outcome: Progressing   Problem: Pain Managment: Goal: General experience of comfort will improve 04/02/2020 1203 by Liborio Nixon, RN Outcome: Progressing 04/02/2020 0738 by Liborio Nixon, RN Outcome: Progressing   Problem: Safety: Goal: Ability to remain free from injury will improve 04/02/2020 1203 by Liborio Nixon, RN Outcome: Progressing 04/02/2020 0738 by Liborio Nixon, RN Outcome: Progressing   Problem: Skin Integrity: Goal: Risk for impaired skin integrity will decrease 04/02/2020 1203 by  Liborio Nixon, RN Outcome: Progressing 04/02/2020 0738 by Liborio Nixon, RN Outcome: Progressing

## 2020-04-02 NOTE — Care Management Important Message (Signed)
Important Message  Patient Details  Name: KAHMARI KOLLER MRN: [id number] Date of Birth: 1960/12/15   Medicare Important Message Given:  Yes - Important Message mailed due to current National Emergency     Corey Harold 04/02/2020, 12:26 PM

## 2020-04-02 NOTE — Progress Notes (Signed)
SATURATION QUALIFICATIONS: (This note is used to comply with regulatory documentation for home oxygen)  Patient Saturations on Room Air at Rest = 90%  Patient Saturations on Room Air while Ambulating = 85%  Patient Saturations on 4 Liters of oxygen while Ambulating = 94%  Please briefly explain why patient needs home oxygen: to safely ambulate and do ADL while at home

## 2020-04-02 NOTE — Discharge Summary (Signed)
Physician Discharge Summary  Jared [name] ZOX:[id number] DOB: Aug 07, 1960 DOA: 03/29/2020  PCP: [name], NP  Admit date: 03/29/2020 Discharge date: 04/02/2020  Admitted From:  Home  Disposition: Home   Recommendations for Outpatient Follow-up:  1. Follow up with PCP in 2 weeks  Home Health:  Home oxygen, Hospital Bed   Discharge Condition: STABLE   CODE STATUS: FULL    Brief Hospitalization Summary: Please see all hospital notes, images, labs for full details of the hospitalization. ADMISSION HPI: Jared [name] is a 59 y.o. male with medical history significant for anxiety, COPD, CAD, GERD, OSA, hypertension, CHF, NSTEMI who presents to the the emergency department due to 2-week onset of flulike symptoms including cough with occasional production of white mucus, sore throat, headaches, body aches and generalized weakness with loss of taste and smell and decreased appetite.  He states that he had contact with 2 family members with COVID-19 positive prior to onset of his symptoms.  He noted shortness of breath which has worsened within last couple of days and was associated with generalized weakness and fatigue, this was associated with lightheadedness on ambulation.  He checked his O2 sat at home due to his symptoms and noted it to be 88%, so he decided to go to the ED for further evaluation and management.  ED Course:  In the emergency department, he was tachypneic and was placed on supplemental oxygen at 4 PM with O2 sat ranging from 92-98%.  Work-up in the ED showed normal CBC, mild hyponatremia, creatinine at 1.47 (last lab for comparison was 5 years ago with creatinine at 1.8).  Lactic acid and procalcitonin were normal, inflammatory markers (LDH, ferritin, CRP, D-dimer, fibrinogen) were elevated.  Chest x-ray shows bilateral pneumonia.  Tylenol was given and patient was treated with IV Decadron, Solu-Medrol was started.  Hospitalist was asked to admit patient for further  evaluation management.  Hospital Course  Brief Admission History:  59 y.o.malewith medical history significant foranxiety, COPD, CAD/prior NSTEMI, GERD, OSA, hypertension, CHF, and morbid obesity admitted on 03/29/2020 with acute hypoxic respiratory failure secondary to Covid PNA.   Assessment & Plan:   Active Problems:   Obstructive sleep apnea   GERD   Dyslipidemia   Morbid obesity    Pneumonia due to COVID-19 virus   Acute respiratory failure with hypoxia   Prolonged QT interval   CKD (chronic kidney disease) stage 3, GFR 30-59 ml/min   1. Acute respiratory failure with hypoxia secondary to Covid 19 pneumonia - Pt improving. Oxygen is slowly being weaned down as tolerated.  Pt remains stable.  DC home 10/14.  He has completed remdesivir.  DC home to complete course of oral steroids.  2. Morbid obesity and OSA - continue nightly CPAP as ordered.  Ambulatory referral to pulmonology.   3. CAD - continue aspirin, lipitor and metoprolol.  He has follow up appt with cardiology in November 2021.  4. Stage 3a CKD - stable.    DVT prophylaxis:  SCD/heparin Code Status:  Full  Family Communication:  Disposition: home  Discharge Diagnoses:  Active Problems:   Obstructive sleep apnea   GERD   Dyslipidemia   Morbid obesity (HCC)   Pneumonia due to COVID-19 virus   Acute respiratory failure with hypoxia (HCC)   Prolonged QT interval   CKD (chronic kidney disease) stage 3, GFR 30-59 ml/min [facility])   Discharge Instructions: Discharge Instructions    Ambulatory referral to Pulmonology   Complete by: As directed  OSA/O[facility]   Reason for referral: Other     Allergies as of 04/02/2020   No Known Allergies     Medication List    STOP taking these medications   DAYQUIL MULTI-SYMPTOM COLD/FLU PO     TAKE these medications   albuterol 108 (90 Base) MCG/ACT inhaler Commonly known as: VENTOLIN HFA Inhale 2 puffs into the lungs every 6 (six) hours as needed for wheezing or  shortness of breath. Insurance preference   ascorbic acid 500 MG tablet Commonly known as: VITAMIN C Take 1 tablet (500 mg total) by mouth daily. Start taking on: April 03, 2020   aspirin 81 MG tablet Take 81 mg by mouth daily.   atorvastatin 80 MG tablet Commonly known as: LIPITOR TAKE 1 TABLET (80 MG TOTAL) BY MOUTH DAILY AT 6 PM.   Breo Ellipta 200-25 MCG/INH Aepb Generic drug: fluticasone furoate-vilanterol Inhale 1 puff into the lungs daily.   citalopram 20 MG tablet Commonly known as: CELEXA Take 20 mg by mouth every morning.   dexamethasone 6 MG tablet Commonly known as: DECADRON Take 1 tablet (6 mg total) by mouth daily with breakfast for 6 days. Start taking on: April 03, 2020   guaiFENesin-dextromethorphan 100-10 MG/5ML syrup Commonly known as: ROBITUSSIN DM Take 10 mLs by mouth every 4 (four) hours as needed for cough.   HYDROcodone-acetaminophen 5-325 MG tablet Commonly known as: NORCO/VICODIN Take 1 tablet by mouth 3 (three) times daily. prn   metoprolol tartrate 25 MG tablet Commonly known as: LOPRESSOR TAKE 0.5 TABLETS (12.5 MG TOTAL) BY MOUTH 2 (TWO) TIMES DAILY.   Nitrostat 0.4 MG SL tablet Generic drug: nitroGLYCERIN PLACE 1 TABLET UNDER THE TONGUE EVERY 5 MINS AS NEEDED FOR CHEST PAIN.   pantoprazole 40 MG tablet Commonly known as: PROTONIX TAKE 1 TABLET (40 MG TOTAL) BY MOUTH DAILY.   zinc sulfate 220 (50 Zn) MG capsule Take 1 capsule (220 mg total) by mouth daily. Start taking on: April 03, 2020            Durable Medical Equipment  (From admission, onward)         Start     Ordered   04/02/20 1102  For home use only DME Hospital bed  Once       Question Answer Comment  Length of Need Lifetime   The above medical condition requires: Patient requires the ability to reposition frequently   Head must be elevated greater than: 30 degrees   Bed type Semi-electric      04/02/20 1101   04/02/20 1102  For home use only DME  oxygen  Once       Question Answer Comment  Length of Need 6 Months   Mode or (Route) Nasal cannula   Liters per Minute 4   Frequency Continuous (stationary and portable oxygen unit needed)   Oxygen conserving device Yes   Oxygen delivery system Gas      04/02/20 1101          Follow-up Information    [name], NP. Schedule an appointment as soon as possible for a visit in 2 week(s).   Specialty: Nurse Practitioner Contact information: [address] [place] [address] [phone]        Jonelle Sidle, MD .   Specialty: Cardiology Contact information: [address] [place] [address] [phone]              No Known Allergies Allergies  as of 04/02/2020   No Known Allergies     Medication List    STOP taking these medications   DAYQUIL MULTI-SYMPTOM COLD/FLU PO     TAKE these medications   albuterol 108 (90 Base) MCG/ACT inhaler Commonly known as: VENTOLIN HFA Inhale 2 puffs into the lungs every 6 (six) hours as needed for wheezing or shortness of breath. Insurance preference   ascorbic acid 500 MG tablet Commonly known as: VITAMIN C Take 1 tablet (500 mg total) by mouth daily. Start taking on: April 03, 2020   aspirin 81 MG tablet Take 81 mg by mouth daily.   atorvastatin 80 MG tablet Commonly known as: LIPITOR TAKE 1 TABLET (80 MG TOTAL) BY MOUTH DAILY AT 6 PM.   Breo Ellipta 200-25 MCG/INH Aepb Generic drug: fluticasone furoate-vilanterol Inhale 1 puff into the lungs daily.   citalopram 20 MG tablet Commonly known as: CELEXA Take 20 mg by mouth every morning.   dexamethasone 6 MG tablet Commonly known as: DECADRON Take 1 tablet (6 mg total) by mouth daily with breakfast for 6 days. Start taking on: April 03, 2020   guaiFENesin-dextromethorphan 100-10 MG/5ML syrup Commonly known as: ROBITUSSIN DM Take 10 mLs by mouth every 4 (four) hours as needed for cough.   HYDROcodone-acetaminophen 5-325  MG tablet Commonly known as: NORCO/VICODIN Take 1 tablet by mouth 3 (three) times daily. prn   metoprolol tartrate 25 MG tablet Commonly known as: LOPRESSOR TAKE 0.5 TABLETS (12.5 MG TOTAL) BY MOUTH 2 (TWO) TIMES DAILY.   Nitrostat 0.4 MG SL tablet Generic drug: nitroGLYCERIN PLACE 1 TABLET UNDER THE TONGUE EVERY 5 MINS AS NEEDED FOR CHEST PAIN.   pantoprazole 40 MG tablet Commonly known as: PROTONIX TAKE 1 TABLET (40 MG TOTAL) BY MOUTH DAILY.   zinc sulfate 220 (50 Zn) MG capsule Take 1 capsule (220 mg total) by mouth daily. Start taking on: April 03, 2020            Durable Medical Equipment  (From admission, onward)         Start     Ordered   04/02/20 1102  For home use only DME Hospital bed  Once       Question Answer Comment  Length of Need Lifetime   The above medical condition requires: Patient requires the ability to reposition frequently   Head must be elevated greater than: 30 degrees   Bed type Semi-electric      04/02/20 1101   04/02/20 1102  For home use only DME oxygen  Once       Question Answer Comment  Length of Need 6 Months   Mode or (Route) Nasal cannula   Liters per Minute 4   Frequency Continuous (stationary and portable oxygen unit needed)   Oxygen conserving device Yes   Oxygen delivery system Gas      04/02/20 1101          Procedures/Studies: CT CHEST W CONTRAST  Result Date: 03/30/2020 CLINICAL DATA:  COVID-19 positive with apparent pneumonia EXAM: CT CHEST WITH CONTRAST TECHNIQUE: Multidetector CT imaging of the chest was performed during intravenous contrast administration. CONTRAST:  OMNIPAQUE IOHEXOL 300 MG/ML  SOLN COMPARISON:  March 29, 2020 chest radiograph; April 14, 2015 FINDINGS: Cardiovascular: No appreciable pulmonary emboli. No thoracic aortic aneurysm or dissection evident. Visualized great vessels appear unremarkable. Note that the right innominate and left common carotid arteries arise as a common trunk,  an anatomic variant. There is no appreciable pericardial effusion  or pericardial thickening. Mediastinum/Nodes: Thyroid appears normal. No appreciable thoracic adenopathy. There is moderate anterior mediastinal fat. No esophageal lesions are appreciable. Lungs/Pleura: There is extensive multifocal airspace opacity throughout the lungs bilaterally consistent with multifocal pneumonia. No appreciable pleural effusions. No frank consolidation. Upper Abdomen: There is hepatic steatosis. Visualized upper abdominal structures otherwise appear unremarkable. Is Musculoskeletal: There is degenerative change in the thoracic spine with diffuse idiopathic skeletal hyperostosis in the mid to lower thoracic region. No blastic or lytic bone lesions. No chest wall lesions. IMPRESSION: 1. Multifocal airspace opacity consistent with multifocal pneumonia. Suspect atypical organism pneumonia, particularly given the clinical history. No consolidation or pleural effusions. 2.  No evident adenopathy. 3. No pulmonary embolus is appreciable. No thoracic aortic aneurysm or dissection. 4.  Hepatic steatosis. 5. 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 Electronically Signed   By: Bretta BangWilliam  Woodruff III M.D.   On: 03/30/2020 09:51   DG Chest Portable 1 View  Result Date: 03/29/2020 CLINICAL DATA:  Weakness and headaches. EXAM: PORTABLE CHEST 1 VIEW COMPARISON:  May 05, 2016 FINDINGS: The mediastinal contour and cardiac silhouette are normal. Patchy consolidations of the right upper lobe, left mid lung, right lung base are identified. Small left pleural effusion is noted. Bony structures are normal. IMPRESSION: Bilateral pneumonias. Electronically Signed   By: Sherian ReinWei-Chen  Lin M.D.   On: 03/29/2020 14:24     Subjective: Pt says he feels well.  He wants to go home.  He had requested hospital bed.   Discharge Exam: Vitals:   04/02/20 0541 04/02/20 0843  BP: (!) 144/67   Pulse: 87    Resp: 17   Temp:    SpO2: 94% 94%   Vitals:   04/01/20 2058 04/01/20 2101 04/02/20 0541 04/02/20 0843  BP:  (!) 141/78 (!) 144/67   Pulse:  79 87   Resp:  17 17   Temp:  (!) 97.4 F (36.3 C)    TempSrc:      SpO2: (!) 89% 90% 94% 94%  Weight:      Height:      General exam: morbidly obese male, Appears calm and comfortable  Respiratory system: No increased work of breathing.  Cardiovascular system: normal S1 & S2 heard.  No JVD, murmurs, rubs, gallops or clicks. No pedal edema. Gastrointestinal system: Abdomen is nondistended, soft and nontender. No organomegaly or masses felt. Normal bowel sounds heard. Central nervous system: Alert and oriented. No focal neurological deficits. Extremities: Symmetric 5 x 5 power. Skin: No rashes, lesions or ulcers Psychiatry: Judgement and insight appear normal. Mood & affect appropriate.    The results of significant diagnostics from this hospitalization (including imaging, microbiology, ancillary and laboratory) are listed below for reference.    Microbiology: Recent Results (from the past 240 hour(s))  Respiratory Panel by RT PCR (Flu A&B, Covid) - Nasopharyngeal Swab     Status: Abnormal   Collection Time: 03/29/20  1:02 PM   Specimen: Nasopharyngeal Swab  Result Value Ref Range Status   SARS Coronavirus 2 by RT PCR POSITIVE (A) NEGATIVE Final    Comment: RESULT CALLED TO, READ BACK BY AND VERIFIED WITH: HOLCULM R. @ 1435 ON 101021 BY HENDERSON L. (NOTE) SARS-CoV-2 target nucleic acids are DETECTED.  SARS-CoV-2 RNA is generally detectable in upper respiratory specimens  during the acute phase of infection. Positive results are indicative of the presence of the identified virus, but do not rule out bacterial infection or co-infection with other  pathogens not detected by the test. Clinical correlation with patient history and other diagnostic information is necessary to determine patient infection status. The expected result is  Negative.  Fact Sheet for Patients:  https://www.moore.com/  Fact Sheet for Healthcare Providers: https://www.young.biz/  This test is not yet approved or cleared by the [place] FDA and  has been authorized for detection and/or diagnosis of SARS-CoV-2 by FDA under an Emergency Use Authorization (EUA).  This EUA will remain in effect (meaning this tes t can be used) for the duration of  the COVID-19 declaration under Section 564(b)(1) of the Act, 21 U.S.C. section 360bbb-3(b)(1), unless the authorization is terminated or revoked sooner.      Influenza A by PCR NEGATIVE NEGATIVE Final   Influenza B by PCR NEGATIVE NEGATIVE Final    Comment: (NOTE) The Xpert Xpress SARS-CoV-2/FLU/RSV assay is intended as an aid in  the diagnosis of influenza from Nasopharyngeal swab specimens and  should not be used as a sole basis for treatment. Nasal washings and  aspirates are unacceptable for Xpert Xpress SARS-CoV-2/FLU/RSV  testing.  Fact Sheet for Patients: https://www.moore.com/  Fact Sheet for Healthcare Providers: https://www.young.biz/  This test is not yet approved or cleared by the [place] FDA and  has been authorized for detection and/or diagnosis of SARS-CoV-2 by  FDA under an Emergency Use Authorization (EUA). This EUA will remain  in effect (meaning this test can be used) for the duration of the  Covid-19 declaration under Section 564(b)(1) of the Act, 21  U.S.C. section 360bbb-3(b)(1), unless the authorization is  terminated or revoked. Performed at [facility], [address] [place], [place] [address]   Blood Culture (routine x 2)     Status: None (Preliminary result)   Collection Time: 03/29/20  9:16 PM   Specimen: BLOOD RIGHT HAND  Result Value Ref Range Status   Specimen Description BLOOD RIGHT HAND  Final   Special Requests   Final    BOTTLES DRAWN AEROBIC AND ANAEROBIC Blood  Culture adequate volume   Culture   Final    NO GROWTH 4 DAYS Performed at [facility], [address] [place], [place] [address]    Report Status PENDING  Incomplete  Blood Culture (routine x 2)     Status: None (Preliminary result)   Collection Time: 03/29/20  9:30 PM   Specimen: Left Antecubital; Blood  Result Value Ref Range Status   Specimen Description LEFT ANTECUBITAL  Final   Special Requests   Final    BOTTLES DRAWN AEROBIC AND ANAEROBIC Blood Culture adequate volume   Culture   Final    NO GROWTH 3 DAYS Performed at [facility], [address] [place], [place] [address]    Report Status PENDING  Incomplete     Labs: BNP (last 3 results) No results for input(s): BNP in the last 8760 hours. Basic Metabolic Panel: Recent Labs  Lab 03/29/20 1315 03/30/20 0612 03/31/20 0834 04/01/20 0653 04/02/20 0742  NA 134* 135 136 137 138  K 4.1 3.5 3.4* 4.2 4.3  CL 101 104 102 102 102  CO2 22 19* 22 24 26   GLUCOSE 108* 181* 177* 171* 146*  BUN 13 22 34* 41* 41*  CREATININE 1.47* 1.41* 1.60* 1.66* 1.62*  CALCIUM 8.6* 8.6* 8.9 9.0 8.9  MG  --  1.8 2.1 2.4 2.4  PHOS  --  1.5* 2.1* 2.6 3.4   Liver Function Tests: Recent Labs  Lab 03/30/20 0612 03/31/20 2725 04/01/20 3664 04/02/20 4034  AST 137* 145* 124* 100*  ALT 64* 82* 83* 71*  ALKPHOS 37* 40 45 43  BILITOT 1.1 1.1 1.1 1.0  PROT 7.9 7.8 7.9 7.5  ALBUMIN 3.2* 3.4* 3.4* 3.1*   No results for input(s): LIPASE, AMYLASE in the last 168 hours. No results for input(s): AMMONIA in the last 168 hours. CBC: Recent Labs  Lab 03/29/20 1315 03/30/20 0612 03/31/20 0834 04/01/20 0653 04/02/20 0742  WBC 7.1 6.0 11.9* 12.9* 11.1*  NEUTROABS  --  5.2 10.5* 11.5* 9.1*  HGB 13.4 13.6 13.6 13.6 13.2  HCT 39.8 39.9 40.6 41.5 40.6  MCV 89.2 87.3 87.9 89.2 89.8  PLT 249 284 378 431* 382   Cardiac Enzymes: No results for input(s): CKTOTAL, CKMB, CKMBINDEX, TROPONINI in the last 168 hours. BNP: Invalid input(s):  POCBNP CBG: Recent Labs  Lab 04/01/20 1151 04/01/20 1648 04/01/20 2056 04/02/20 0736 04/02/20 1104  GLUCAP 173* 187* 204* 139* 132*   D-Dimer Recent Labs    04/01/20 0653 04/02/20 0742  DDIMER 0.33 0.34   Hgb A1c No results for input(s): HGBA1C in the last 72 hours. Lipid Profile No results for input(s): CHOL, HDL, LDLCALC, TRIG, CHOLHDL, LDLDIRECT in the last 72 hours. Thyroid function studies No results for input(s): TSH, T4TOTAL, T3FREE, THYROIDAB in the last 72 hours.  Invalid input(s): FREET3 Anemia work up [employer]    04/01/20 0653 04/02/20 0742  FERRITIN 632* 509*   Urinalysis No results found for: COLORURINE, APPEARANCEUR, LABSPEC, PHURINE, GLUCOSEU, HGBUR, BILIRUBINUR, KETONESUR, PROTEINUR, UROBILINOGEN, NITRITE, LEUKOCYTESUR Sepsis Labs Invalid input(s): PROCALCITONIN,  WBC,  LACTICIDVEN Microbiology Recent Results (from the past 240 hour(s))  Respiratory Panel by RT PCR (Flu A&B, Covid) - Nasopharyngeal Swab     Status: Abnormal   Collection Time: 03/29/20  1:02 PM   Specimen: Nasopharyngeal Swab  Result Value Ref Range Status   SARS Coronavirus 2 by RT PCR POSITIVE (A) NEGATIVE Final    Comment: RESULT CALLED TO, READ BACK BY AND VERIFIED WITH: HOLCULM R. @ 1435 ON 101021 BY HENDERSON L. (NOTE) SARS-CoV-2 target nucleic acids are DETECTED.  SARS-CoV-2 RNA is generally detectable in upper respiratory specimens  during the acute phase of infection. Positive results are indicative of the presence of the identified virus, but do not rule out bacterial infection or co-infection with other pathogens not detected by the test. Clinical correlation with patient history and other diagnostic information is necessary to determine patient infection status. The expected result is Negative.  Fact Sheet for Patients:  https://www.moore.com/  Fact Sheet for Healthcare Providers: https://www.young.biz/  This test is not  yet approved or cleared by the [place] FDA and  has been authorized for detection and/or diagnosis of SARS-CoV-2 by FDA under an Emergency Use Authorization (EUA).  This EUA will remain in effect (meaning this tes t can be used) for the duration of  the COVID-19 declaration under Section 564(b)(1) of the Act, 21 U.S.C. section 360bbb-3(b)(1), unless the authorization is terminated or revoked sooner.      Influenza A by PCR NEGATIVE NEGATIVE Final   Influenza B by PCR NEGATIVE NEGATIVE Final    Comment: (NOTE) The Xpert Xpress SARS-CoV-2/FLU/RSV assay is intended as an aid in  the diagnosis of influenza from Nasopharyngeal swab specimens and  should not be used as a sole basis for treatment. Nasal washings and  aspirates are unacceptable for Xpert Xpress SARS-CoV-2/FLU/RSV  testing.  Fact Sheet for Patients: https://www.moore.com/  Fact Sheet for Healthcare Providers: https://www.young.biz/  This test is  not yet approved or cleared by the [place] and  has been authorized for detection and/or diagnosis of SARS-CoV-2 by  FDA under an Emergency Use Authorization (EUA). This EUA will remain  in effect (meaning this test can be used) for the duration of the  Covid-19 declaration under Section 564(b)(1) of the Act, 21  U.S.C. section 360bbb-3(b)(1), unless the authorization is  terminated or revoked. Performed at [facility], [address] [place], [place] [address]   Blood Culture (routine x 2)     Status: None (Preliminary result)   Collection Time: 03/29/20  9:16 PM   Specimen: BLOOD RIGHT HAND  Result Value Ref Range Status   Specimen Description BLOOD RIGHT HAND  Final   Special Requests   Final    BOTTLES DRAWN AEROBIC AND ANAEROBIC Blood Culture adequate volume   Culture   Final    NO GROWTH 4 DAYS Performed at [facility], [address], [place], [place] [address]    Report Status PENDING  Incomplete  Blood  Culture (routine x 2)     Status: None (Preliminary result)   Collection Time: 03/29/20  9:30 PM   Specimen: Left Antecubital; Blood  Result Value Ref Range Status   Specimen Description LEFT ANTECUBITAL  Final   Special Requests   Final    BOTTLES DRAWN AEROBIC AND ANAEROBIC Blood Culture adequate volume   Culture   Final    NO GROWTH 3 DAYS Performed at [facility], [address] [place], [place] [address]    Report Status PENDING  Incomplete   Time coordinating discharge: 34 minutes   SIGNED:  Standley Dakins, MD  [facility] 04/02/2020, 11:22 AM How to contact the [facility] Attending or Consulting provider 7A - 7P or covering provider during after hours 7P -7A, for this patient?  1. Check the care team in [facility] and look for a) attending/consulting [facility] provider listed and b) the [facility] team listed 2. Log into [email or web address] and use [facility]'s universal password to access. If you do not have the password, please contact the hospital operator. 3. Locate the [facility] provider you are looking for under [facility] and page to a number that you can be directly reached. 4. If you still have difficulty reaching the provider, please page the [facility] (Director on Call) for the Hospitalists listed on amion for assistance.

## 2020-04-02 NOTE — Plan of Care (Signed)

## 2020-04-03 LAB — CULTURE, BLOOD (ROUTINE X 2)
Culture: NO GROWTH
Special Requests: ADEQUATE

## 2020-04-04 LAB — CULTURE, BLOOD (ROUTINE X 2)
Culture: NO GROWTH
Special Requests: ADEQUATE

## 2020-04-27 NOTE — Progress Notes (Signed)
Cardiology Office Note  Date: 04/28/2020   ID: JACORY KAMEL, DOB 04-May-1961, MRN [id number]  PCP:  [name], NP  Cardiologist:  Nona Dell, MD Electrophysiologist:  None   Chief Complaint  Patient presents with  . Cardiac follow-up    History of Present Illness: Jared [name] is a 59 y.o. male last assessed via telehealth encounter in April.  He is here today with his wife for a follow-up visit.  He was hospitalized in mid October with COVID-19 pneumonia, I reviewed the discharge summary.  He states that he is slowly improving, using a walker to get around mainly related to bilateral knee pain, he is also had some dependent edema but states that this is getting better.  He is still on oxygen via nasal cannula, has pending follow-up with [facility].  From a cardiac perspective he reports no obvious angina.  He continues on medical therapy including aspirin, Lipitor, Lopressor, and as needed nitroglycerin.  He has not had a recent echocardiogram.  Past Medical History:  Diagnosis Date  . Anxiety   . Arthritis   . Asthma   . Back pain   . CAD (coronary artery disease)    a. cath 03/13/15 - PTCA and DES of prox LAD; Elevated LVEDP  . COPD (chronic obstructive pulmonary disease) (HCC)   . Cough syncope   . Degenerative disc disease, cervical   . Degenerative disc disease, lumbar   . Emphysema   . GERD (gastroesophageal reflux disease)   . History of kidney stones   . History of skin cancer   . Hyperlipidemia   . OSA (obstructive sleep apnea)    CPAP  . RBBB (right bundle branch block with left anterior fascicular block)     Past Surgical History:  Procedure Laterality Date  . CARDIAC CATHETERIZATION N/A 03/13/2015   Procedure: Left Heart Cath and Coronary Angiography;  Surgeon: Corky Crafts, MD;  Location: [facility] INVASIVE CV LAB;  Service: Cardiovascular;  Laterality: N/A;  . CARDIAC CATHETERIZATION N/A 03/13/2015   Procedure: Coronary  Stent Intervention;  Surgeon: Corky Crafts, MD;  Location: [facility] INVASIVE CV LAB;  Service: Cardiovascular;  Laterality: N/A;  . Inguinal artery repair     nguinal  . KNEE ARTHROSCOPY Left 01/03/2013   Procedure: LEFT KNEE ARTHROSCOPY WITH DEBRIDEMENT/MICROFRACTURE/MEDIAL FEMORAL CHONDIAL/PARTIAL MEDIAL AND LATERAL MENISCECTOMY;  Surgeon: Javier Docker, MD;  Location: [facility] ORS;  Service: Orthopedics;  Laterality: Left;  . Surgery for sleep apnea      Current Outpatient Medications  Medication Sig Dispense Refill  . albuterol (VENTOLIN HFA) 108 (90 Base) MCG/ACT inhaler Inhale 2 puffs into the lungs every 6 (six) hours as needed for wheezing or shortness of breath. Insurance preference 18 g 3  . ascorbic acid (VITAMIN C) 500 MG tablet Take 1 tablet (500 mg total) by mouth daily. 30 tablet 0  . aspirin 81 MG tablet Take 81 mg by mouth daily.    Marland Kitchen atorvastatin (LIPITOR) 80 MG tablet TAKE 1 TABLET (80 MG TOTAL) BY MOUTH DAILY AT 6 PM. 30 tablet 3  . citalopram (CELEXA) 20 MG tablet Take 20 mg by mouth every morning.     . fluticasone furoate-vilanterol (BREO ELLIPTA) 200-25 MCG/INH AEPB Inhale 1 puff into the lungs daily. 60 each 3  . guaiFENesin-dextromethorphan (ROBITUSSIN DM) 100-10 MG/5ML syrup Take 10 mLs by mouth every 4 (four) hours as needed for cough. 118 mL 0  . HYDROcodone-acetaminophen (NORCO/VICODIN) 5-325 MG tablet Take 1 tablet  by mouth 3 (three) times daily. prn    . metoprolol tartrate (LOPRESSOR) 25 MG tablet TAKE 0.5 TABLETS (12.5 MG TOTAL) BY MOUTH 2 (TWO) TIMES DAILY. 60 tablet 6  . NITROSTAT 0.4 MG SL tablet PLACE 1 TABLET UNDER THE TONGUE EVERY 5 MINS AS NEEDED FOR CHEST PAIN. 25 tablet 3  . pantoprazole (PROTONIX) 40 MG tablet TAKE 1 TABLET (40 MG TOTAL) BY MOUTH DAILY. 30 tablet 3  . zinc sulfate 220 (50 Zn) MG capsule Take 1 capsule (220 mg total) by mouth daily. 30 capsule 0   No current facility-administered medications for this visit.   Allergies:  Patient has no  known allergies.   ROS: No palpitations or syncope.  Physical Exam: VS:  BP 126/62   Pulse 90   Ht 5\' 8"  (1.727 m)   Wt (!) 469 lb (212.7 kg)   SpO2 98% Comment: on 3L O2 via [place]  BMI 71.31 kg/m , BMI Body mass index is 71.31 kg/m.  Wt Readings from Last 3 Encounters:  04/28/20 (!) 469 lb (212.7 kg)  03/30/20 (!) 450 lb (204.1 kg)  10/17/19 (!) 453 lb (205.5 kg)    General: Morbidly obese male, no distress.  Using a walker.  On oxygen via nasal cannula. HEENT: Conjunctiva and lids normal, wearing a mask. Neck: Supple, no elevated JVP or carotid bruits, no thyromegaly. Lungs: Decreased breath sounds without wheezing or crackles, nonlabored breathing at rest. Cardiac: Regular rate and rhythm, no S3 or significant systolic murmur, no pericardial rub. Extremities: Mild lower leg edema.  ECG:  An ECG dated 03/29/2020 was personally reviewed today and demonstrated:  Sinus tachycardia with right bundle branch block and left anterior fascicular block.  Rule out old anterolateral infarct pattern.  Recent Labwork: 04/02/2020: ALT 71; AST 100; BUN 41; Creatinine, Ser 1.62; Hemoglobin 13.2; Magnesium 2.4; Platelets 382; Potassium 4.3; Sodium 138  August 2021: Cholesterol 140, triglycerides 199, HDL 34, LDL 73  Other Studies Reviewed Today:  Chest CTA 03/30/2020: IMPRESSION: 1. Multifocal airspace opacity consistent with multifocal pneumonia. Suspect atypical organism pneumonia, particularly given the clinical history. No consolidation or pleural effusions.  2.  No evident adenopathy.  3. No pulmonary embolus is appreciable. No thoracic aortic aneurysm or dissection.  4.  Hepatic steatosis.  5. 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  Assessment and Plan:  1.  CAD status post DES to the LAD in 2016.  He reports no active angina at this time on medical therapy including aspirin, Lopressor, Lipitor, and as needed  nitroglycerin.  No recent assessment of LVEF, we will obtain an echocardiogram particular in light of recent treatment for COVID-19 pneumonia.  2.  Hypoxic respiratory failure, on oxygen via nasal cannula.  He has a history of emphysema, also OSA.  Recent hospitalization for COVID-19 pneumonia.  He has pending follow-up with [facility].  3.  Mixed hyperlipidemia, continues on Lipitor.  Recent LDL 73.  Medication Adjustments/Labs and Tests Ordered: Current medicines are reviewed at length with the patient today.  Concerns regarding medicines are outlined above.   Tests Ordered: Orders Placed This Encounter  Procedures  . ECHOCARDIOGRAM COMPLETE    Medication Changes: No orders of the defined types were placed in this encounter.   Disposition:  Follow up 6 months in the [place] office.  Signed, [place], MD, [facility] 04/28/2020 2:22 PM    [facility] [address] [place], [place]  [place] Phone: [phone]; Fax: [phone]

## 2020-04-28 ENCOUNTER — Other Ambulatory Visit: Payer: Self-pay

## 2020-04-28 ENCOUNTER — Encounter: Payer: Self-pay | Admitting: Cardiology

## 2020-04-28 ENCOUNTER — Ambulatory Visit (INDEPENDENT_AMBULATORY_CARE_PROVIDER_SITE_OTHER): Payer: Medicare Other | Admitting: Cardiology

## 2020-04-28 VITALS — BP 126/62 | HR 90 | Ht 68.0 in | Wt >= 6400 oz

## 2020-04-28 DIAGNOSIS — I25119 Atherosclerotic heart disease of native coronary artery with unspecified angina pectoris: Secondary | ICD-10-CM

## 2020-04-28 DIAGNOSIS — E782 Mixed hyperlipidemia: Secondary | ICD-10-CM

## 2020-04-28 DIAGNOSIS — R0602 Shortness of breath: Secondary | ICD-10-CM

## 2020-04-28 NOTE — Patient Instructions (Addendum)
Medication Instructions:    Your physician recommends that you continue on your current medications as directed. Please refer to the Current Medication list given to you today.  Labwork:  None  Testing/Procedures: Your physician has requested that you have an echocardiogram. Echocardiography is a painless test that uses sound waves to create images of your heart. It provides your doctor with information about the size and shape of your heart and how well your heart's chambers and valves are working. This procedure takes approximately one hour. There are no restrictions for this procedure.  Follow-Up:  Your physician recommends that you schedule a follow-up appointment in: 6 months.  Any Other Special Instructions Will Be Listed Below (If Applicable).  If you need a refill on your cardiac medications before your next appointment, please call your pharmacy. 

## 2020-05-01 ENCOUNTER — Telehealth: Payer: Self-pay | Admitting: Pulmonary Disease

## 2020-05-01 NOTE — Telephone Encounter (Signed)
Called and spoke with patient, he wants to hospital f/u with Dr. Craige Cotta in [place] as it is closer for him and he is on oxygen.  He is fine seeing another physician since Dr. Craige Cotta has no availability until January.  Dr. Craige Cotta, Please advise if ok for patient to see another physician in [place].  Thank you.

## 2020-05-04 ENCOUNTER — Other Ambulatory Visit: Payer: Self-pay

## 2020-05-04 ENCOUNTER — Ambulatory Visit (HOSPITAL_COMMUNITY)
Admission: RE | Admit: 2020-05-04 | Discharge: 2020-05-04 | Disposition: A | Discharge status: Home / Self Care | Payer: Medicare Other | Source: Ambulatory Visit | Attending: Cardiology | Admitting: Cardiology

## 2020-05-04 DIAGNOSIS — R0602 Shortness of breath: Secondary | ICD-10-CM | POA: Insufficient documentation

## 2020-05-04 LAB — ECHOCARDIOGRAM COMPLETE
Area-P 1/2: 2.91 cm2
S' Lateral: 3.6 cm

## 2020-05-04 MED ORDER — PERFLUTREN LIPID MICROSPHERE
1.0000 mL | INTRAVENOUS | Status: AC | PRN
  Administered 2020-05-04: 1 mL via INTRAVENOUS
  Administered 2020-05-04: 2 mL via INTRAVENOUS
  Filled 2020-05-04: qty 10

## 2020-05-04 MED ORDER — PERFLUTREN LIPID MICROSPHERE: 1.0000 mL | INTRAVENOUS | Status: DC | PRN

## 2020-05-04 NOTE — Progress Notes (Signed)
I.V. cath 20g x 1 inch inserted in right antecubital by Lindaann Slough, RN. at 1035.  At 1106 I.V. removed, site was clean, dry and intact. Pressure dressing placed after removal of I.V.  Celesta Gentile, RCS

## 2020-05-04 NOTE — Progress Notes (Signed)
*  PRELIMINARY RESULTS* Echocardiogram 2D Echocardiogram has been performed with [facility].  Stacey Drain 05/04/2020, 11:01 AM

## 2020-05-05 NOTE — Telephone Encounter (Signed)
Left message for patient with unnamed man. When patient calls back please schedule him for an appt at 8:30 in [place] at next available time.  Thanks!

## 2020-05-05 NOTE — Telephone Encounter (Signed)
Okay to change follow up with me to [place].  Can have him come for appointment at 830 AM when I am next in [place] - please coordinate with staff scheduled to work in [place] with me.

## 2020-05-08 NOTE — Telephone Encounter (Signed)
lmtcb for pt.  

## 2020-05-11 NOTE — Telephone Encounter (Signed)
Will forward to front office pool as this pt was to be scheduled with Dr. Craige Cotta the next time he is in [place] at 0830, which is on 05/25/20.

## 2020-05-15 NOTE — Telephone Encounter (Signed)
Called and left message on pt vm to offer appt with Dr. Craige Cotta on 05/25/2020 @ 8:30am -pr

## 2020-05-19 NOTE — Telephone Encounter (Signed)
Called and left message on pt vm to offer appt on 12/6 - 2nd attempt - closing message per office protocol -pr

## 2020-05-25 ENCOUNTER — Encounter: Payer: Self-pay | Admitting: Pulmonary Disease

## 2020-05-25 ENCOUNTER — Ambulatory Visit (HOSPITAL_COMMUNITY)
Admission: RE | Admit: 2020-05-25 | Discharge: 2020-05-25 | Disposition: A | Discharge status: Home / Self Care | Payer: Medicare Other | Source: Ambulatory Visit | Attending: Pulmonary Disease | Admitting: Pulmonary Disease

## 2020-05-25 ENCOUNTER — Other Ambulatory Visit: Payer: Self-pay

## 2020-05-25 ENCOUNTER — Ambulatory Visit (INDEPENDENT_AMBULATORY_CARE_PROVIDER_SITE_OTHER): Payer: Medicare Other | Admitting: Pulmonary Disease

## 2020-05-25 VITALS — BP 128/74 | HR 81 | Temp 97.0°F | Ht 68.0 in

## 2020-05-25 DIAGNOSIS — Z8701 Personal history of pneumonia (recurrent): Secondary | ICD-10-CM | POA: Diagnosis not present

## 2020-05-25 DIAGNOSIS — I25119 Atherosclerotic heart disease of native coronary artery with unspecified angina pectoris: Secondary | ICD-10-CM | POA: Diagnosis not present

## 2020-05-25 DIAGNOSIS — E669 Obesity, unspecified: Secondary | ICD-10-CM

## 2020-05-25 DIAGNOSIS — G4733 Obstructive sleep apnea (adult) (pediatric): Secondary | ICD-10-CM

## 2020-05-25 DIAGNOSIS — Z8616 Personal history of COVID-19: Secondary | ICD-10-CM | POA: Diagnosis not present

## 2020-05-25 DIAGNOSIS — J432 Centrilobular emphysema: Secondary | ICD-10-CM

## 2020-05-25 DIAGNOSIS — G4730 Sleep apnea, unspecified: Secondary | ICD-10-CM | POA: Diagnosis not present

## 2020-05-25 DIAGNOSIS — J9611 Chronic respiratory failure with hypoxia: Secondary | ICD-10-CM

## 2020-05-25 DIAGNOSIS — Z09 Encounter for follow-up examination after completed treatment for conditions other than malignant neoplasm: Secondary | ICD-10-CM | POA: Diagnosis not present

## 2020-05-25 DIAGNOSIS — Z9989 Dependence on other enabling machines and devices: Secondary | ICD-10-CM

## 2020-05-25 DIAGNOSIS — J449 Chronic obstructive pulmonary disease, unspecified: Secondary | ICD-10-CM

## 2020-05-25 MED ORDER — BREO ELLIPTA 100-25 MCG/INH IN AEPB: 1.0000 | INHALATION_SPRAY | Freq: Every day | RESPIRATORY_TRACT | 3 refills | Status: DC

## 2020-05-25 NOTE — Progress Notes (Signed)
[facility]  Chief Complaint  Patient presents with  . Follow-up    cpap working well    Constitutional:  BP 128/74 (BP Location: Left Wrist, Cuff Size: Normal)   Pulse 81   Temp (!) 97 F (36.1 C) (Other (Comment)) Comment (Src): wrist  Ht 5\' 8"  (1.727 m)   SpO2 98% Comment: 3L O2  BMI 71.31 kg/m   Past Medical History:  RBBB, HLD, Nephrolithiasis, GERD, DJD, CAD, OA, Anxiety, COVID 19 pneumonia October 2021  Past Surgical History:  His  has a past surgical history that includes Surgery for sleep apnea; Inguinal artery repair; Knee arthroscopy (Left, 01/03/2013); Cardiac catheterization (N/A, 03/13/2015); and Cardiac catheterization (N/A, 03/13/2015).  Brief Summary:  Jared [name] is a 59 y.o. male former smoker with COPD and obstructive sleep apnea.      Subjective:   He was in hospital in October with COVID 19 pneumonia.  Started on supplemental oxygen from his hospitalization.  He has pulse oximeter at home.  His SpO2 is in the 90's on room air at rest.  Can drop into mid 80's when walking.  He isn't sure if he needs to use oxygen at night.  He uses CPAP nightly.  No issues with pressure feeling or mask fit.  Not having much cough.  Uses albuterol intermittently.  Physical Exam:   Appearance - well kempt, wearing oxygen, uses a walker  ENMT - no sinus tenderness, no oral exudate, no LAN, Mallampati 3 airway, no stridor  Respiratory - equal breath sounds bilaterally, no wheezing or rales  CV - s1s2 regular rate and rhythm, no murmurs  Ext - no clubbing, no edema  Skin - no rashes  Psych - normal mood and affect   Pulmonary testing:   Spirometry 11/06/14 >> FEV1 1.5 (39%), FEV1% 55  Chest Imaging:   CT chest 04/14/15 >> mild centrilobular emphysema, fatty liver, atherosclerosis  Sleep Tests:   PSG 10/13/00 >> AHI 38  HST 09/03/18 >> AHI 48.5, SpO2 low 82%  Auto CPAP 04/22/20 to 05/21/20 >> used on 30 of 30  nights with average 9 hrs 55 min.  Average AHI 3.9 with median CPAP 11 and 95 th percentile CPAP 13 cm H2O  Cardiac Tests:   Echo 05/04/20 >> EF 60 to 65%, mod LVH, grade 1 DD, mod LA dilation  Social History:  He  reports that he quit smoking about 5 years ago. His smoking use included cigarettes. He started smoking about 48 years ago. He has a 80.00 pack-year smoking history. He has never used smokeless tobacco. He reports that he does not drink alcohol and does not use drugs.  Family History:  His family history includes Heart disease in his brother, father, and mother.     Assessment/Plan:   COPD with asthma and emphysema. - will change him to breo 100-25 - prn albuterol  COVID 19 pneumonia in October 2021. - will repeat chest xray today  Chronic respiratory failure with hypoxia from COVID 19 pneumonia, COPD, and possible obesity hypoventilation. - continue 2 to 3 liters oxygen with exertion - will arrange for ONO with CPAP and then determine if he needs to use supplemental oxygen at night with CPAP - discussed importance of weight loss  Obstructive sleep apnea. - he is compliant with CPAP and report benefit from therapy - he uses [facility] for his DME - continue auto CPAP 5 to 15 cm H2O   Time Spent Involved  in Patient Care on Day of Examination:  32 minutes  Follow up:  Patient Instructions  Finish your current prescription for breo 200-25; once this is finished then change to breo 100-25 one puff daily  Will arrange for chest xray  Will arrange for overnight oxygen test with you using CPAP but not using supplemental oxygen  Follow up in 6 months   Medication List:   Allergies as of 05/25/2020   No Known Allergies     Medication List       Accurate as of May 25, 2020 10:16 AM. If you have any questions, ask your nurse or doctor.        STOP taking these medications   Breo Ellipta 200-25 MCG/INH Aepb Generic drug: fluticasone  furoate-vilanterol Replaced by: Virgel Bouquet Ellipta 100-25 MCG/INH Aepb Stopped by: Coralyn Helling, MD     TAKE these medications   albuterol 108 (90 Base) MCG/ACT inhaler Commonly known as: VENTOLIN HFA Inhale 2 puffs into the lungs every 6 (six) hours as needed for wheezing or shortness of breath. Insurance preference   ascorbic acid 500 MG tablet Commonly known as: VITAMIN C Take 1 tablet (500 mg total) by mouth daily.   aspirin 81 MG tablet Take 81 mg by mouth daily.   atorvastatin 80 MG tablet Commonly known as: LIPITOR TAKE 1 TABLET (80 MG TOTAL) BY MOUTH DAILY AT 6 PM.   Breo Ellipta 100-25 MCG/INH Aepb Generic drug: fluticasone furoate-vilanterol Inhale 1 puff into the lungs daily. Replaces: Breo Ellipta 200-25 MCG/INH Aepb Started by: Coralyn Helling, MD   citalopram 20 MG tablet Commonly known as: CELEXA Take 20 mg by mouth every morning.   guaiFENesin-dextromethorphan 100-10 MG/5ML syrup Commonly known as: ROBITUSSIN DM Take 10 mLs by mouth every 4 (four) hours as needed for cough.   HYDROcodone-acetaminophen 5-325 MG tablet Commonly known as: NORCO/VICODIN Take 1 tablet by mouth 3 (three) times daily. prn   metoprolol tartrate 25 MG tablet Commonly known as: LOPRESSOR TAKE 0.5 TABLETS (12.5 MG TOTAL) BY MOUTH 2 (TWO) TIMES DAILY.   Nitrostat 0.4 MG SL tablet Generic drug: nitroGLYCERIN PLACE 1 TABLET UNDER THE TONGUE EVERY 5 MINS AS NEEDED FOR CHEST PAIN.   pantoprazole 40 MG tablet Commonly known as: PROTONIX TAKE 1 TABLET (40 MG TOTAL) BY MOUTH DAILY.   zinc gluconate 50 MG tablet Take 50 mg by mouth daily.       Signature:  Coralyn Helling, MD [facility] Pager - [phone] 05/25/2020, 10:16 AM

## 2020-05-25 NOTE — Patient Instructions (Signed)
Finish your current prescription for breo 200-25; once this is finished then change to breo 100-25 one puff daily  Will arrange for chest xray  Will arrange for overnight oxygen test with you using CPAP but not using supplemental oxygen  Follow up in 6 months

## 2020-05-29 ENCOUNTER — Telehealth: Payer: Self-pay | Admitting: Pulmonary Disease

## 2020-05-29 DIAGNOSIS — U071 COVID-19: Secondary | ICD-10-CM

## 2020-05-29 DIAGNOSIS — J1282 Pneumonia due to coronavirus disease 2019: Secondary | ICD-10-CM

## 2020-05-29 NOTE — Telephone Encounter (Signed)
I spoke to Melissa at [employer] & she is going to work on getting ono delivered to pt.  She states someone from [employer] will call him.

## 2020-05-29 NOTE — Telephone Encounter (Signed)
DG Chest 2 View  Result Date: 05/25/2020 CLINICAL DATA:  Follow-up coronavirus pneumonia. EXAM: CHEST - 2 VIEW COMPARISON:  03/29/2020 FINDINGS: Cardiomegaly as seen previously. Widespread hazy pulmonary infiltrates and bronchial thickening persist, but with some improvement particularly in the right upper lobe. No dense consolidation, collapse or effusion. No worsening or new finding. IMPRESSION: Persistent but slightly improved diffuse pulmonary infiltrates and bronchial thickening. Some improvement particularly in the right upper lobe. No dense consolidation or collapse. Electronically Signed   By: Paulina Fusi M.D.   On: 05/25/2020 15:57    Please let him know that his chest xray shows scarring from prior COVID pneumonia.  He needs to have a high resolution CT chest and pulmonary function test to further assess.  Will schedule follow up appointment after these tests are completed.

## 2020-05-29 NOTE — Telephone Encounter (Signed)
Called and went over xray results per Dr Craige Cotta with patient. All questions answered and patient expressed full understanding. Patient agreeable to high resolutions CT and PFT being ordered. Orders for High resolution CT chest and pulmonary function test orders placed per Dr Craige Cotta.  Patient is stating he got a call from DME to do ONO study stating that he needed to go to [facility] to pick it up and patient states he has no way to get to place to pick it up and for this to be completed due to transportation. Dr Craige Cotta please advise.

## 2020-05-29 NOTE — Telephone Encounter (Signed)
Can they mail ONO device to him?  Please check with [facility].  Otherwise, might need to get switched to different DME to get ONO done.

## 2020-06-05 ENCOUNTER — Telehealth: Payer: Self-pay | Admitting: Pulmonary Disease

## 2020-06-05 DIAGNOSIS — G4733 Obstructive sleep apnea (adult) (pediatric): Secondary | ICD-10-CM

## 2020-06-05 NOTE — Telephone Encounter (Signed)
Called and went over ONO with CPAP results per Dr Craige Cotta. All questions answered and patient expressed full understanding. Patient agreeable to Dr Evlyn Courier recommendation for an in lab CPAP titration study to be done. Order placed per Dr Craige Cotta. Nothing further needed at this time.

## 2020-06-05 NOTE — Telephone Encounter (Signed)
ONO with CPAP 06/02/20 >> test time 4 hrs 1 min  Baseline SpO2 92%, low SpO2 80%.  Spent 17 min 46 sec with SpO2 < 88%.   Please let him know his oxygen level is low at night.  He needs an in lab CPAP titration study to determine if he needs adjustment to CPAP set up or if he needs supplemental oxygen added in with CPAP at night.

## 2020-06-23 ENCOUNTER — Ambulatory Visit: Payer: Medicare Other | Admitting: Pulmonary Disease

## 2020-06-24 ENCOUNTER — Other Ambulatory Visit (HOSPITAL_COMMUNITY): Payer: Medicare Other

## 2020-06-29 ENCOUNTER — Other Ambulatory Visit (HOSPITAL_COMMUNITY): Payer: Medicare Other

## 2020-06-29 ENCOUNTER — Ambulatory Visit (HOSPITAL_COMMUNITY): Payer: Medicare Other

## 2020-08-13 ENCOUNTER — Telehealth: Payer: Self-pay | Admitting: Pulmonary Disease

## 2020-08-13 DIAGNOSIS — J9611 Chronic respiratory failure with hypoxia: Secondary | ICD-10-CM

## 2020-08-13 NOTE — Telephone Encounter (Signed)
Okay to send order to arrange for ONO with CPAP.

## 2020-08-13 NOTE — Telephone Encounter (Signed)
ATC pt. Unable to leave VM. WCB.  Order placed for ONO on CPAP per pt's request and Dr. Evlyn Courier approval.

## 2020-08-13 NOTE — Telephone Encounter (Signed)
Pt states that his oxygen machine went out on him started making a smell and he states he has been w/o it and he states that he has been monitoring his oxygen and states that Dr. Craige Cotta might want to reschedule a test to [facility] for a possible HST to show that he no longer needs it bc he feels fine w/o it just wanted Dr. Silverio Lay advice. Pls regard [phone]

## 2020-08-13 NOTE — Telephone Encounter (Signed)
Spoke with the pt  He states that his o2 concentrator has been putting off a chemical smell so he stopped using it  He is using his CPAP with sleep only  He states that he feels like it's okay to leave off the o2 bc he wakes up throughout the night and checks sats quickly and they are always 96% or above  He states that he had to cancel the CPAP titration study bc he had GI virus  He is asking if we can order another ONO on CPAP to see if he truly still has desats  He states he feels like "my lungs have healed"  Please advise, thanks!

## 2020-08-14 NOTE — Telephone Encounter (Signed)
Called and spoke to pt's son. He states pt was not home and to try back a bit later. Called back and line rang several times without VM. WCB to let pt know of the order placed.

## 2020-08-20 NOTE — Telephone Encounter (Signed)
ATC pt. Unable to leave VM. WCB once more before closing message.

## 2020-08-21 NOTE — Telephone Encounter (Signed)
Closing per protocol 

## 2020-09-01 ENCOUNTER — Telehealth: Payer: Self-pay | Admitting: Pulmonary Disease

## 2020-09-01 NOTE — Telephone Encounter (Signed)
ONO with CPAP >> tests time 4 hrs 19 min.  Baseline SpO2 91%, low SpO2 83%.  Spent 22 min with SpO2 < 88%.   Test report indicates that test date was 05/19/2008.  Can you please confirm with his DME when the ONO was actual done and let me know.

## 2020-09-07 NOTE — Telephone Encounter (Signed)
Called and spoke with Bjorn Loser at [employer] about ONO on CPAP test. Steward Drone stated the wrong date was on in it and the test was done on 08/24/2020. Bjorn Loser stated they would correct the date and re-fax to office [phone]). Will route to Dr Craige Cotta as Lorain Childes.

## 2020-09-07 NOTE — Telephone Encounter (Signed)
Okay.  In that case, then please let Jared [name] now his oxygen level is still low at night, and recommendation is still to undergo CPAP titration study in sleep lab if he is agreeable.

## 2020-09-09 NOTE — Telephone Encounter (Signed)
Called and left message on voicemail to please return phone call to go over ONO results. Contact number provided. 

## 2020-09-11 ENCOUNTER — Telehealth: Payer: Self-pay | Admitting: Pulmonary Disease

## 2020-09-11 DIAGNOSIS — G4733 Obstructive sleep apnea (adult) (pediatric): Secondary | ICD-10-CM

## 2020-09-11 NOTE — Telephone Encounter (Signed)
Okay.  In that case, then please let Jared [name] now his oxygen level is still low at night, and recommendation is still to undergo CPAP titration study in sleep lab if he is agreeable.   LM on VM for pt to call back to review these results

## 2020-09-11 NOTE — Telephone Encounter (Signed)
Called and left message on voicemail to please return phone call to go over ONO results. Contact number provided. 

## 2020-09-15 NOTE — Telephone Encounter (Signed)
Letter written and sent by mail for patient to please call office to go over ONO results and Dr Evlyn Courier recommendations due to making several attempts to reach patient by telephone without success.

## 2020-09-16 ENCOUNTER — Encounter: Payer: Self-pay | Admitting: *Deleted

## 2020-09-16 NOTE — Telephone Encounter (Signed)
LMTCB and letter mailed  

## 2020-09-16 NOTE — Telephone Encounter (Signed)
Spoke with patient to let him know of ONO results and that we are going to order CPAP titration study. Patient requested it be done at [facility] Order has been placed. Nothing further needed at this time.

## 2020-09-16 NOTE — Addendum Note (Signed)
Addended by: Benjie Karvonen R on: 09/16/2020 02:45 PM   Modules accepted: Orders

## 2020-09-22 ENCOUNTER — Telehealth: Payer: Self-pay | Admitting: Pulmonary Disease

## 2020-09-22 NOTE — Telephone Encounter (Signed)
Received message from [place] our [facility] and shw states she called patient to give him cpap titration appt info. He wants to make sure this is absolutely necessary to get this done. He is leery about going to hospital.    Please advise

## 2020-09-23 NOTE — Telephone Encounter (Signed)
Left message for patient to call back  

## 2020-09-23 NOTE — Telephone Encounter (Signed)
I have called and LM on VM for the pt to call back x 2.

## 2020-09-23 NOTE — Telephone Encounter (Signed)
This is the only way to determine if he needs supplemental oxygen at night with CPAP.  Titration study is necessary.  Can schedule ROV if he has additional questions.

## 2020-09-24 NOTE — Telephone Encounter (Signed)
Spoke with patient about CPAP titration to let him know that Dr. Craige Cotta advised that it is necessary for him to have test done as it was the only way to determine if he needs oxygen at night with his CPAP. Patient expressed understanding and stated that he was going to call the sleep center and see if he could reschedule the test because he can't do it on the day its currently scheduled. Verified number with patient. Nothing further needed at this time.

## 2020-09-24 NOTE — Telephone Encounter (Signed)
Lmtcb for pt.  

## 2020-09-30 ENCOUNTER — Encounter: Payer: Medicare Other | Admitting: Pulmonary Disease

## 2020-10-11 ENCOUNTER — Other Ambulatory Visit: Payer: Self-pay

## 2020-10-11 ENCOUNTER — Ambulatory Visit: Payer: Medicare Other | Attending: Pulmonary Disease | Admitting: Pulmonary Disease

## 2020-10-11 DIAGNOSIS — G4733 Obstructive sleep apnea (adult) (pediatric): Secondary | ICD-10-CM | POA: Diagnosis present

## 2020-10-27 ENCOUNTER — Telehealth: Payer: Self-pay | Admitting: Pulmonary Disease

## 2020-10-27 DIAGNOSIS — G4733 Obstructive sleep apnea (adult) (pediatric): Secondary | ICD-10-CM

## 2020-10-27 NOTE — Telephone Encounter (Signed)
CPAP titration 10/11/20 >> CPAP 16 cm H2O >> AHI 4.8, SpO2 low 87%.  Didn't need supplemental oxygen.   Please let him know his CPAP study showed that he needs higher pressure on his CPAP machine to control his sleep apnea and oxygen level at night, but he doesn't need supplemental oxygen at night.  Please send order to [facility] to change his CPAP to 16 cm H2O.

## 2020-10-27 NOTE — Procedures (Signed)
    Patient Name: Jared [name], Jared [name] Date: 10/11/2020 Gender: Male D.O.B: 01-21-1961 Age (years): 33 Referring Provider: Coralyn Helling MD, ABSM Height (inches): 68 Interpreting Physician: Coralyn Helling MD, ABSM Weight (lbs): 469 RPSGT: Alfonso Ellis BMI: 71 MRN: [id number] Neck Size: 22.00  CLINICAL INFORMATION The patient is referred for a CPAP titration to treat sleep apnea.  Date of HST 09/03/18: AHI 48.5, SpO2 low 82%.  SLEEP STUDY TECHNIQUE As per the AASM Manual for the Scoring of Sleep and Associated Events v2.3 (April 2016) with a hypopnea requiring 4% desaturations.  The channels recorded and monitored were frontal, central and occipital EEG, electrooculogram (EOG), submentalis EMG (chin), nasal and oral airflow, thoracic and abdominal wall motion, anterior tibialis EMG, snore microphone, electrocardiogram, and pulse oximetry. Continuous positive airway pressure (CPAP) was initiated at the beginning of the study and titrated to treat sleep-disordered breathing.  MEDICATIONS Medications self-administered by patient taken the night of the study : N/A  TECHNICIAN COMMENTS Comments added by technician: CPAP therapy started at 7 cm of H20, per patients' request. . CPAP titration final pressure was 16 cm of H20 due to events and mild snoring. Patient tolerated CPAP very well. There was no O2 supplement added during therapy, due to SaO2 levels were at or above 90%. Patient awakened to go to bathroom and requested to D/C test because he was no longer sleepy. Patient was also fitted for a full face cradle mask due to oral breathing at times. Mild events noticed during REM stage at the final pressure of 16 cm of H2O Comments added by scorer: N/A  RESPIRATORY PARAMETERS Optimal PAP Pressure (cm): 16 AHI at Optimal Pressure (/hr): 4.8 Overall Minimal O2 (%): 86.00 Supine % at Optimal Pressure (%): 100% Minimal O2 at Optimal Pressure (%): 87.00   SLEEP ARCHITECTURE The study was  initiated at 10:00:46 PM and ended at 4:42:49 AM.  Sleep onset time was 34.3 minutes and the sleep efficiency was 75.6%. The total sleep time was 304 minutes.  The patient spent 9.21% of the night in stage N1 sleep, 62.01% in stage N2 sleep, 17.60% in stage N3 and 11.2% in REM.Stage REM latency was 214.5 minutes  Wake after sleep onset was 63.8. Alpha intrusion was absent. Supine sleep was 100.00%.  CARDIAC DATA The 2 lead EKG demonstrated sinus rhythm. The mean heart rate was 85.58 beats per minute. Other EKG findings include: PVCs.  LEG MOVEMENT DATA The total Periodic Limb Movements of Sleep (PLMS) were 0. The PLMS index was 0.00. A PLMS index of <15 is considered normal in adults.  IMPRESSIONS - The optimal PAP pressure was 16 cm of water. - He did not require the use of supplemental oxygen during this study.  DIAGNOSIS - Obstructive sleep apnea.  RECOMMENDATIONS - Adjust CPAP to 16 cm water pressure.  He was fitted with a ResMed Airfit N20 large nasal mask.  [Electronically signed] 10/27/2020 04:27 PM  Coralyn Helling MD, ABSM Diplomate, American Board of Sleep Medicine   NPI: [id number]

## 2020-10-29 NOTE — Telephone Encounter (Signed)
Called and went over Cpap titration results per Dr Craige Cotta with patient. All questions answered and patient expressed full understanding of results and agreeable with Dr Evlyn Courier recommendations.   Dr Craige Cotta please clarify, I can send in order to change CPAP to 16 cm H20. Do I need to state to discontinue O2 use with CPAP and/or discontinue O2 in general?

## 2020-10-29 NOTE — Telephone Encounter (Signed)
He should continue using 2 to 3 liters oxygen with exertion during the day.  He does not need to use supplemental oxygen with CPAP at night.  He just needs to use CPAP 16 cm H2O at night.

## 2020-10-30 NOTE — Telephone Encounter (Signed)
Called and updated patient on Dr Evlyn Courier response/recommendations to continue using 2 to 3 liters oxygen with exertion during the day and that he does not need to use supplemental oxygen with CPAP at night and that he just needs to use CPAP 16 cm H2O at night. Patient expressed full understanding. Orders placed per Dr Craige Cotta. Nothing further needed at this time.

## 2020-11-10 ENCOUNTER — Encounter: Payer: Self-pay | Admitting: Cardiology

## 2020-11-10 ENCOUNTER — Other Ambulatory Visit: Payer: Self-pay

## 2020-11-10 ENCOUNTER — Ambulatory Visit (INDEPENDENT_AMBULATORY_CARE_PROVIDER_SITE_OTHER): Payer: Medicare Other | Admitting: Cardiology

## 2020-11-10 VITALS — BP 130/84 | HR 74 | Ht 68.0 in | Wt >= 6400 oz

## 2020-11-10 DIAGNOSIS — E782 Mixed hyperlipidemia: Secondary | ICD-10-CM

## 2020-11-10 DIAGNOSIS — I25119 Atherosclerotic heart disease of native coronary artery with unspecified angina pectoris: Secondary | ICD-10-CM | POA: Diagnosis not present

## 2020-11-10 DIAGNOSIS — G4733 Obstructive sleep apnea (adult) (pediatric): Secondary | ICD-10-CM | POA: Diagnosis not present

## 2020-11-10 DIAGNOSIS — Z9989 Dependence on other enabling machines and devices: Secondary | ICD-10-CM | POA: Diagnosis not present

## 2020-11-10 MED ORDER — METOPROLOL TARTRATE 25 MG PO TABS
12.5000 mg | ORAL_TABLET | Freq: Two times a day (BID) | ORAL | 6 refills | Status: AC
Start: 2020-11-10 — End: ?

## 2020-11-10 NOTE — Patient Instructions (Signed)
Medication Instructions:  Your physician recommends that you continue on your current medications as directed. Please refer to the Current Medication list given to you today.  *If you need a refill on your cardiac medications before your next appointment, please call your pharmacy*   Lab Work: None today  If you have labs (blood work) drawn today and your tests are completely normal, you will receive your results only by: MyChart Message (if you have MyChart) OR A paper copy in the mail If you have any lab test that is abnormal or we need to change your treatment, we will call you to review the results.   Testing/Procedures: None today    Follow-Up: At [facility], you and your health needs are our priority.  As part of our continuing mission to provide you with exceptional heart care, we have created designated Provider Care Teams.  These Care Teams include your primary Cardiologist (physician) and Advanced Practice Providers (APPs -  Physician Assistants and Nurse Practitioners) who all work together to provide you with the care you need, when you need it.  We recommend signing up for the patient portal called "MyChart".  Sign up information is provided on this After Visit Summary.  MyChart is used to connect with patients for Virtual Visits (Telemedicine).  Patients are able to view lab/test results, encounter notes, upcoming appointments, etc.  Non-urgent messages can be sent to your provider as well.   To learn more about what you can do with MyChart, go to [email or web address].    Your next appointment:   3 month(s)  The format for your next appointment:   In Person  Provider:   Samuel McDowell, MD   Other Instructions None   

## 2020-11-10 NOTE — Progress Notes (Signed)
Cardiology Office Note  Date: 11/10/2020   ID: Jared [name], DOB [birth year], MRN [id number]  PCP:  [name], NP  Cardiologist:  Nona Dell, MD Electrophysiologist:  None   Chief Complaint  Patient presents with  . Cardiac follow-up    History of Present Illness: Jared [name] is a 60 y.o. male last seen in November 2021.  He is here for a routine visit.  Reports no chest pain and stable dyspnea on exertion.  No longer using oxygen supplementation regularly.  CPAP is also been adjusted with follow-up per Pulmonary.  Follow-up echocardiogram in November 2021 revealed LVEF 60 to 65% with moderate LVH and mild diastolic dysfunction, normal RV contraction.  I reviewed his medications which are noted below.  He states that he is due for follow-up with his PCP.  Past Medical History:  Diagnosis Date  . Anxiety   . Arthritis   . Asthma   . Back pain   . CAD (coronary artery disease)    a. cath 03/13/15 - PTCA and DES of prox LAD; Elevated LVEDP  . COPD (chronic obstructive pulmonary disease) (HCC)   . Cough syncope   . Degenerative disc disease, cervical   . Degenerative disc disease, lumbar   . Emphysema   . GERD (gastroesophageal reflux disease)   . History of kidney stones   . History of skin cancer   . Hyperlipidemia   . OSA (obstructive sleep apnea)    CPAP  . RBBB (right bundle branch block with left anterior fascicular block)     Past Surgical History:  Procedure Laterality Date  . CARDIAC CATHETERIZATION N/A 03/13/2015   Procedure: Left Heart Cath and Coronary Angiography;  Surgeon: Corky Crafts, MD;  Location: [facility] INVASIVE CV LAB;  Service: Cardiovascular;  Laterality: N/A;  . CARDIAC CATHETERIZATION N/A 03/13/2015   Procedure: Coronary Stent Intervention;  Surgeon: Corky Crafts, MD;  Location: [facility] INVASIVE CV LAB;  Service: Cardiovascular;  Laterality: N/A;  . Inguinal artery repair     nguinal  . KNEE ARTHROSCOPY Left 01/03/2013    Procedure: LEFT KNEE ARTHROSCOPY WITH DEBRIDEMENT/MICROFRACTURE/MEDIAL FEMORAL CHONDIAL/PARTIAL MEDIAL AND LATERAL MENISCECTOMY;  Surgeon: Javier Docker, MD;  Location: [facility] ORS;  Service: Orthopedics;  Laterality: Left;  . Surgery for sleep apnea      Current Outpatient Medications  Medication Sig Dispense Refill  . albuterol (VENTOLIN HFA) 108 (90 Base) MCG/ACT inhaler Inhale 2 puffs into the lungs every 6 (six) hours as needed for wheezing or shortness of breath. Insurance preference 18 g 3  . ascorbic acid (VITAMIN C) 500 MG tablet Take 1 tablet (500 mg total) by mouth daily. 30 tablet 0  . aspirin 81 MG tablet Take 81 mg by mouth daily.    Marland Kitchen atorvastatin (LIPITOR) 80 MG tablet TAKE 1 TABLET (80 MG TOTAL) BY MOUTH DAILY AT 6 PM. 30 tablet 3  . citalopram (CELEXA) 20 MG tablet Take 20 mg by mouth every morning.    . fluticasone furoate-vilanterol (BREO ELLIPTA) 100-25 MCG/INH AEPB Inhale 1 puff into the lungs daily. 90 each 3  . HYDROcodone-acetaminophen (NORCO/VICODIN) 5-325 MG tablet Take 1 tablet by mouth 3 (three) times daily. prn    . NITROSTAT 0.4 MG SL tablet PLACE 1 TABLET UNDER THE TONGUE EVERY 5 MINS AS NEEDED FOR CHEST PAIN. 25 tablet 3  . pantoprazole (PROTONIX) 40 MG tablet TAKE 1 TABLET (40 MG TOTAL) BY MOUTH DAILY. 30 tablet 3  . zinc gluconate 50 MG  tablet Take 50 mg by mouth daily.    . metoprolol tartrate (LOPRESSOR) 25 MG tablet Take 0.5 tablets (12.5 mg total) by mouth 2 (two) times daily. 90 tablet 6   No current facility-administered medications for this visit.   Allergies:  Patient has no known allergies.   ROS: No palpitations or syncope.  Physical Exam: VS:  BP 130/84   Pulse 74   Ht 5\' 8"  (1.727 m)   Wt (!) 459 lb (208.2 kg)   SpO2 99%   BMI 69.79 kg/m , BMI Body mass index is 69.79 kg/m.  Wt Readings from Last 3 Encounters:  11/10/20 (!) 459 lb (208.2 kg)  04/28/20 (!) 469 lb (212.7 kg)  03/30/20 (!) 450 lb (204.1 kg)    General: Morbidly obese  male, appears comfortable at rest. HEENT: Conjunctiva and lids normal, wearing a mask. Neck: Supple, no elevated JVP or carotid bruits, no thyromegaly. Lungs: Decreased breath sounds, nonlabored breathing at rest. Cardiac: Regular rate and rhythm, no S3 or significant systolic murmur, no pericardial rub. Extremities: Stable, mild leg edema.  ECG:  An ECG dated 03/29/2020 was personally reviewed today and demonstrated:  Sinus tachycardia with right bundle branch block and left anterior fascicular block, rule out old anterolateral infarct pattern.  Recent Labwork: 04/02/2020: ALT 71; AST 100; BUN 41; Creatinine, Ser 1.62; Hemoglobin 13.2; Magnesium 2.4; Platelets 382; Potassium 4.3; Sodium 138   Other Studies Reviewed Today:  Echocardiogram 05/04/2020: 1. Left ventricular ejection fraction, by estimation, is 60 to 65%. The  left ventricle has normal function. The left ventricle demonstrates  regional wall motion abnormalities (see scoring diagram/findings for  description). There is moderate left  ventricular hypertrophy. Left ventricular diastolic parameters are  consistent with Grade I diastolic dysfunction (impaired relaxation). No  obvious LV mural thrombus.  2. Right ventricular systolic function is normal. The right ventricular  size is normal. Tricuspid regurgitation signal is inadequate for assessing  PA pressure.  3. Left atrial size was moderately dilated.  4. The mitral valve is grossly normal. Trivial mitral valve  regurgitation.  5. The aortic valve is tricuspid. Aortic valve regurgitation is not  visualized.  6. The inferior vena cava is dilated in size with >50% respiratory  variability, suggesting right atrial pressure of 8 mmHg.  Assessment and Plan:  1.  CAD status post DES to the LAD in 2016.  He is not describing active angina at this time.  Continue observation physical therapy including aspirin, Lipitor, Lopressor, and as needed nitroglycerin.  Follow-up  LVEF normal at 60 to 65%.  2.  OSA, continues on CPAP with follow-up by Pulmonary.  3.  Mixed hyperlipidemia, on Lipitor.  Medication Adjustments/Labs and Tests Ordered: Current medicines are reviewed at length with the patient today.  Concerns regarding medicines are outlined above.   Tests Ordered: No orders of the defined types were placed in this encounter.   Medication Changes: Meds ordered this encounter  Medications  . metoprolol tartrate (LOPRESSOR) 25 MG tablet    Sig: Take 0.5 tablets (12.5 mg total) by mouth 2 (two) times daily.    Dispense:  90 tablet    Refill:  6    Disposition:  Follow up 6 months  Signed, 2017, MD, [facility] 11/10/2020 1:55 PM    [facility] [address] [place], [place] Phone: [phone]; Fax: [phone]

## 2021-01-26 ENCOUNTER — Other Ambulatory Visit: Payer: Self-pay | Admitting: Pulmonary Disease

## 2021-03-23 ENCOUNTER — Ambulatory Visit: Payer: Medicare Other | Admitting: Pulmonary Disease

## 2021-03-25 ENCOUNTER — Ambulatory Visit: Payer: Medicare Other | Admitting: Cardiology

## 2021-03-29 ENCOUNTER — Ambulatory Visit: Payer: Medicare Other | Admitting: Cardiology

## 2021-03-29 NOTE — Progress Notes (Deleted)
Cardiology Office Note  Date: 03/29/2021   ID: Jared [name], DOB [birth year], MRN [id number]  PCP:  [name], NP  Cardiologist:  Nona Dell, MD Electrophysiologist:  None   No chief complaint on file.   History of Present Illness: Jared [name] is a 60 y.o. male last seen in May.  Past Medical History:  Diagnosis Date   Anxiety    Arthritis    Asthma    Back pain    CAD (coronary artery disease)    a. cath 03/13/15 - PTCA and DES of prox LAD; Elevated LVEDP   COPD (chronic obstructive pulmonary disease) (HCC)    Cough syncope    Degenerative disc disease, cervical    Degenerative disc disease, lumbar    Emphysema    GERD (gastroesophageal reflux disease)    History of kidney stones    History of skin cancer    Hyperlipidemia    OSA (obstructive sleep apnea)    CPAP   RBBB (right bundle branch block with left anterior fascicular block)     Past Surgical History:  Procedure Laterality Date   CARDIAC CATHETERIZATION N/A 03/13/2015   Procedure: Left Heart Cath and Coronary Angiography;  Surgeon: Corky Crafts, MD;  Location: [facility] INVASIVE CV LAB;  Service: Cardiovascular;  Laterality: N/A;   CARDIAC CATHETERIZATION N/A 03/13/2015   Procedure: Coronary Stent Intervention;  Surgeon: Corky Crafts, MD;  Location: [facility] INVASIVE CV LAB;  Service: Cardiovascular;  Laterality: N/A;   Inguinal artery repair     nguinal   KNEE ARTHROSCOPY Left 01/03/2013   Procedure: LEFT KNEE ARTHROSCOPY WITH DEBRIDEMENT/MICROFRACTURE/MEDIAL FEMORAL CHONDIAL/PARTIAL MEDIAL AND LATERAL MENISCECTOMY;  Surgeon: Javier Docker, MD;  Location: [facility];  Service: Orthopedics;  Laterality: Left;   Surgery for sleep apnea      Current Outpatient Medications  Medication Sig Dispense Refill   albuterol (VENTOLIN HFA) 108 (90 Base) MCG/ACT inhaler Inhale 2 puffs into the lungs every 6 (six) hours as needed for wheezing or shortness of breath. Insurance preference 18 g 3    ascorbic acid (VITAMIN C) 500 MG tablet Take 1 tablet (500 mg total) by mouth daily. 30 tablet 0   aspirin 81 MG tablet Take 81 mg by mouth daily.     atorvastatin (LIPITOR) 80 MG tablet TAKE 1 TABLET (80 MG TOTAL) BY MOUTH DAILY AT 6 PM. 30 tablet 3   BREO ELLIPTA 100-25 MCG/INH AEPB TAKE 1 PUFF BY MOUTH EVERY DAY 60 each 3   citalopram (CELEXA) 20 MG tablet Take 20 mg by mouth every morning.     HYDROcodone-acetaminophen (NORCO/VICODIN) 5-325 MG tablet Take 1 tablet by mouth 3 (three) times daily. prn     metoprolol tartrate (LOPRESSOR) 25 MG tablet Take 0.5 tablets (12.5 mg total) by mouth 2 (two) times daily. 90 tablet 6   NITROSTAT 0.4 MG SL tablet PLACE 1 TABLET UNDER THE TONGUE EVERY 5 MINS AS NEEDED FOR CHEST PAIN. 25 tablet 3   pantoprazole (PROTONIX) 40 MG tablet TAKE 1 TABLET (40 MG TOTAL) BY MOUTH DAILY. 30 tablet 3   zinc gluconate 50 MG tablet Take 50 mg by mouth daily.     No current facility-administered medications for this visit.   Allergies:  Patient has no known allergies.   Social History: The patient  reports that he quit smoking about 6 years ago. His smoking use included cigarettes. He started smoking about 49 years ago. He has a 80.00 pack-year smoking history. He  has never used smokeless tobacco. He reports that he does not drink alcohol and does not use drugs.   Family History: The patient's family history includes Heart disease in his brother, father, and mother.   ROS:  Please see the history of present illness. Otherwise, complete review of systems is positive for {NONE DEFAULTED:18576}.  All other systems are reviewed and negative.   Physical Exam: VS:  There were no vitals taken for this visit., BMI There is no height or weight on file to calculate BMI.  Wt Readings from Last 3 Encounters:  11/10/20 (!) 459 lb (208.2 kg)  04/28/20 (!) 469 lb (212.7 kg)  03/30/20 (!) 450 lb (204.1 kg)    General: Patient appears comfortable at rest. HEENT: Conjunctiva and  lids normal, oropharynx clear with moist mucosa. Neck: Supple, no elevated JVP or carotid bruits, no thyromegaly. Lungs: Clear to auscultation, nonlabored breathing at rest. Cardiac: Regular rate and rhythm, no S3 or significant systolic murmur, no pericardial rub. Abdomen: Soft, nontender, no hepatomegaly, bowel sounds present, no guarding or rebound. Extremities: No pitting edema, distal pulses 2+. Skin: Warm and dry. Musculoskeletal: No kyphosis. Neuropsychiatric: Alert and oriented x3, affect grossly appropriate.  ECG:  An ECG dated 03/29/2020 was personally reviewed today and demonstrated:  Sinus tachycardia with right bundle branch block and left anterior fascicular block, rule out old anterolateral infarct pattern.  Recent Labwork: 04/02/2020: ALT 71; AST 100; BUN 41; Creatinine, Ser 1.62; Hemoglobin 13.2; Magnesium 2.4; Platelets 382; Potassium 4.3; Sodium 138  September 2022: Hemoglobin 14.8, platelets 222, BUN 16, creatinine 1.16, potassium 4.2, AST 32, ALT 27, TSH 3.83 August 2021: Cholesterol 140, triglycerides 199, HDL 34, LDL 73  Other Studies Reviewed Today:  Echocardiogram 05/04/2020:  1. Left ventricular ejection fraction, by estimation, is 60 to 65%. The  left ventricle has normal function. The left ventricle demonstrates  regional wall motion abnormalities (see scoring diagram/findings for  description). There is moderate left  ventricular hypertrophy. Left ventricular diastolic parameters are  consistent with Grade I diastolic dysfunction (impaired relaxation). No  obvious LV mural thrombus.   2. Right ventricular systolic function is normal. The right ventricular  size is normal. Tricuspid regurgitation signal is inadequate for assessing  PA pressure.   3. Left atrial size was moderately dilated.   4. The mitral valve is grossly normal. Trivial mitral valve  regurgitation.   5. The aortic valve is tricuspid. Aortic valve regurgitation is not  visualized.   6.  The inferior vena cava is dilated in size with >50% respiratory  variability, suggesting right atrial pressure of 8 mmHg.  Assessment and Plan:    Medication Adjustments/Labs and Tests Ordered: Current medicines are reviewed at length with the patient today.  Concerns regarding medicines are outlined above.   Tests Ordered: No orders of the defined types were placed in this encounter.   Medication Changes: No orders of the defined types were placed in this encounter.   Disposition:  Follow up {follow up:15908}  Signed, Jonelle Sidle, MD, [facility] 03/29/2021 8:11 AM    [facility] at [facility] [address] [place], [place], [place] [address] Phone: [phone]; Fax: [phone]

## 2021-05-10 ENCOUNTER — Ambulatory Visit: Payer: Medicare Other | Admitting: Cardiology

## 2021-05-10 NOTE — Progress Notes (Deleted)
Cardiology Office Note  Date: 05/10/2021   ID: Jared [name], DOB 02/20/1961, MRN [id number]  PCP:  [name], NP  Cardiologist:  Nona Dell, MD Electrophysiologist:  None   No chief complaint on file.   History of Present Illness: Jared [name] is a 60 y.o. male last seen in May.  Past Medical History:  Diagnosis Date   Anxiety    Arthritis    Asthma    Back pain    CAD (coronary artery disease)    a. cath 03/13/15 - PTCA and DES of prox LAD; Elevated LVEDP   COPD (chronic obstructive pulmonary disease) (HCC)    Cough syncope    Degenerative disc disease, cervical    Degenerative disc disease, lumbar    Emphysema    GERD (gastroesophageal reflux disease)    History of kidney stones    History of skin cancer    Hyperlipidemia    OSA (obstructive sleep apnea)    CPAP   RBBB (right bundle branch block with left anterior fascicular block)     Past Surgical History:  Procedure Laterality Date   CARDIAC CATHETERIZATION N/A 03/13/2015   Procedure: Left Heart Cath and Coronary Angiography;  Surgeon: Corky Crafts, MD;  Location: [facility] INVASIVE CV LAB;  Service: Cardiovascular;  Laterality: N/A;   CARDIAC CATHETERIZATION N/A 03/13/2015   Procedure: Coronary Stent Intervention;  Surgeon: Corky Crafts, MD;  Location: [facility] INVASIVE CV LAB;  Service: Cardiovascular;  Laterality: N/A;   Inguinal artery repair     nguinal   KNEE ARTHROSCOPY Left 01/03/2013   Procedure: LEFT KNEE ARTHROSCOPY WITH DEBRIDEMENT/MICROFRACTURE/MEDIAL FEMORAL CHONDIAL/PARTIAL MEDIAL AND LATERAL MENISCECTOMY;  Surgeon: Javier Docker, MD;  Location: [facility] ORS;  Service: Orthopedics;  Laterality: Left;   Surgery for sleep apnea      Current Outpatient Medications  Medication Sig Dispense Refill   albuterol (VENTOLIN HFA) 108 (90 Base) MCG/ACT inhaler Inhale 2 puffs into the lungs every 6 (six) hours as needed for wheezing or shortness of breath. Insurance preference 18 g 3    ascorbic acid (VITAMIN C) 500 MG tablet Take 1 tablet (500 mg total) by mouth daily. 30 tablet 0   aspirin 81 MG tablet Take 81 mg by mouth daily.     atorvastatin (LIPITOR) 80 MG tablet TAKE 1 TABLET (80 MG TOTAL) BY MOUTH DAILY AT 6 PM. 30 tablet 3   BREO ELLIPTA 100-25 MCG/INH AEPB TAKE 1 PUFF BY MOUTH EVERY DAY 60 each 3   citalopram (CELEXA) 20 MG tablet Take 20 mg by mouth every morning.     HYDROcodone-acetaminophen (NORCO/VICODIN) 5-325 MG tablet Take 1 tablet by mouth 3 (three) times daily. prn     metoprolol tartrate (LOPRESSOR) 25 MG tablet Take 0.5 tablets (12.5 mg total) by mouth 2 (two) times daily. 90 tablet 6   NITROSTAT 0.4 MG SL tablet PLACE 1 TABLET UNDER THE TONGUE EVERY 5 MINS AS NEEDED FOR CHEST PAIN. 25 tablet 3   pantoprazole (PROTONIX) 40 MG tablet TAKE 1 TABLET (40 MG TOTAL) BY MOUTH DAILY. 30 tablet 3   zinc gluconate 50 MG tablet Take 50 mg by mouth daily.     No current facility-administered medications for this visit.   Allergies:  Patient has no known allergies.   Social History: The patient  reports that he quit smoking about 6 years ago. His smoking use included cigarettes. He started smoking about 49 years ago. He has a 80.00 pack-year smoking history. He  has never used smokeless tobacco. He reports that he does not drink alcohol and does not use drugs.   Family History: The patient's family history includes Heart disease in his brother, father, and mother.   ROS:  Please see the history of present illness. Otherwise, complete review of systems is positive for {NONE DEFAULTED:18576}.  All other systems are reviewed and negative.   Physical Exam: VS:  There were no vitals taken for this visit., BMI There is no height or weight on file to calculate BMI.  Wt Readings from Last 3 Encounters:  11/10/20 (!) 459 lb (208.2 kg)  04/28/20 (!) 469 lb (212.7 kg)  03/30/20 (!) 450 lb (204.1 kg)    General: Patient appears comfortable at rest. HEENT: Conjunctiva and  lids normal, oropharynx clear with moist mucosa. Neck: Supple, no elevated JVP or carotid bruits, no thyromegaly. Lungs: Clear to auscultation, nonlabored breathing at rest. Cardiac: Regular rate and rhythm, no S3 or significant systolic murmur, no pericardial rub. Abdomen: Soft, nontender, no hepatomegaly, bowel sounds present, no guarding or rebound. Extremities: No pitting edema, distal pulses 2+. Skin: Warm and dry. Musculoskeletal: No kyphosis. Neuropsychiatric: Alert and oriented x3, affect grossly appropriate.  ECG:  An ECG dated  03/29/2020 was personally reviewed today and demonstrated:  Sinus tachycardia with right bundle branch block and left anterior fascicular block, rule out old anterolateral infarct pattern.  Recent Labwork:  October 2021: Hemoglobin 13.2, platelets 382, potassium 4.3, BUN 41, creatinine 1.62, AST 100, ALT 71 November 2021: Hemoglobin A1c 6.3%  Other Studies Reviewed Today:  Echocardiogram 05/04/2020:  1. Left ventricular ejection fraction, by estimation, is 60 to 65%. The  left ventricle has normal function. The left ventricle demonstrates  regional wall motion abnormalities (see scoring diagram/findings for  description). There is moderate left  ventricular hypertrophy. Left ventricular diastolic parameters are  consistent with Grade I diastolic dysfunction (impaired relaxation). No  obvious LV mural thrombus.   2. Right ventricular systolic function is normal. The right ventricular  size is normal. Tricuspid regurgitation signal is inadequate for assessing  PA pressure.   3. Left atrial size was moderately dilated.   4. The mitral valve is grossly normal. Trivial mitral valve  regurgitation.   5. The aortic valve is tricuspid. Aortic valve regurgitation is not  visualized.   6. The inferior vena cava is dilated in size with >50% respiratory  variability, suggesting right atrial pressure of 8 mmHg.  Assessment and Plan:    Medication  Adjustments/Labs and Tests Ordered: Current medicines are reviewed at length with the patient today.  Concerns regarding medicines are outlined above.   Tests Ordered: No orders of the defined types were placed in this encounter.   Medication Changes: No orders of the defined types were placed in this encounter.   Disposition:  Follow up {follow up:15908}  Signed, Jonelle Sidle, MD, [facility] 05/10/2021 10:06 AM    [facility] at [facility] [address] [place], [place], [place] [address] Phone: [phone]; Fax: [phone]

## 2021-06-07 ENCOUNTER — Ambulatory Visit: Payer: Medicare Other | Admitting: Pulmonary Disease

## 2021-11-12 IMAGING — CT CT CHEST W/ CM
2 of 3 series · 15 of 36 positions shown, 18 images · IV contrast (omnipaque)
Comparison: March 29, 2020 chest radiograph; April 14, 2015

CLINICAL DATA: [id number] positive with apparent pneumonia

EXAM:
CT CHEST WITH CONTRAST
TECHNIQUE: Multidetector CT imaging of the chest was performed during
intravenous contrast administration.
CONTRAST:  150mL OMNIPAQUE IOHEXOL 300 MG/ML  SOLN

[Series 2: routine chest with · axial · 0.82mm/px · z∈[+1207,+1487]mm · 12 of 166 slices shown, 15 images]
[im 13/166  mediastinal]
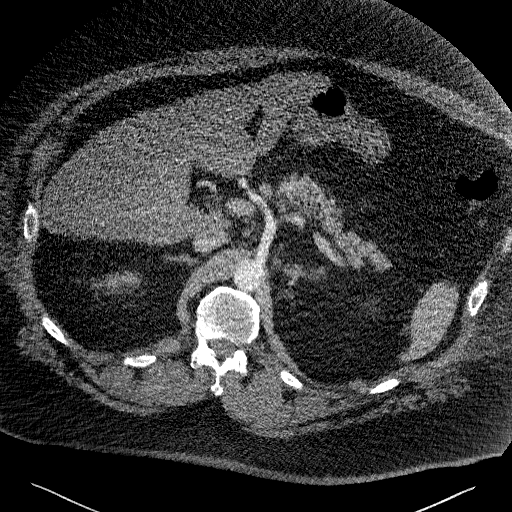
[im 13/166  lung]
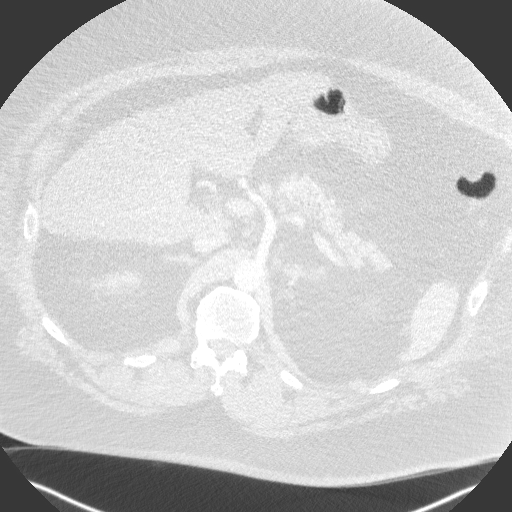
[im 25/166  lung]
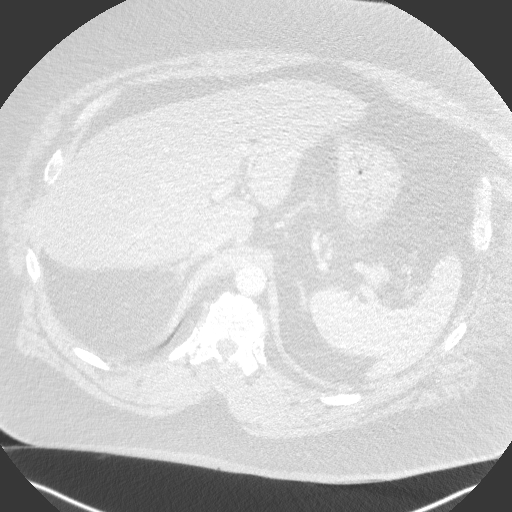
[im 37/166  lung]
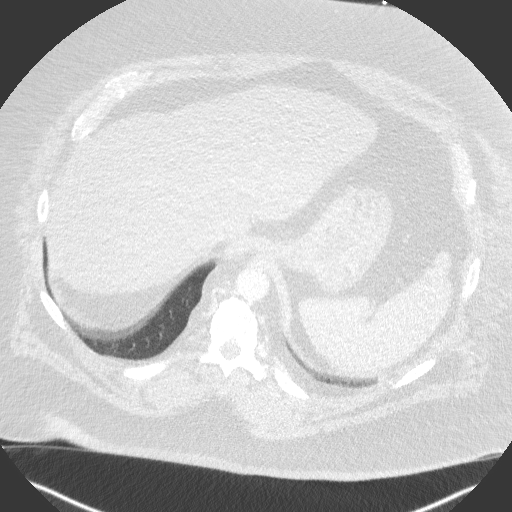
[im 49/166  lung]
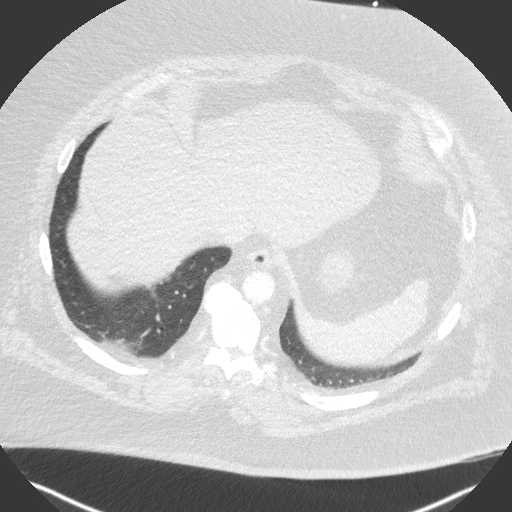
[im 62/166  mediastinal]
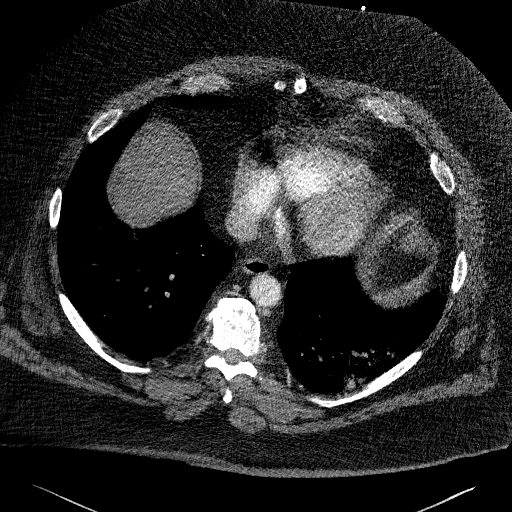
[im 62/166  lung]
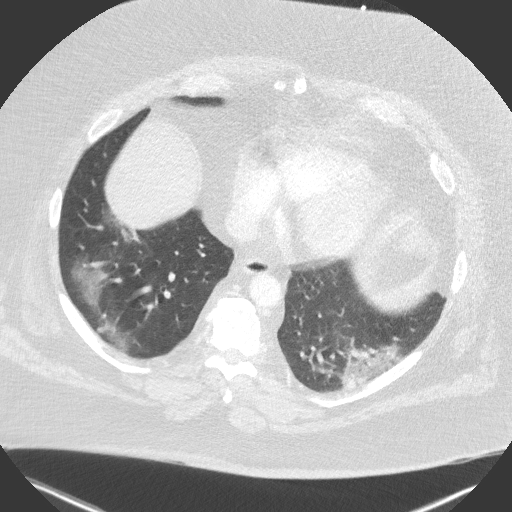
[im 74/166  lung]
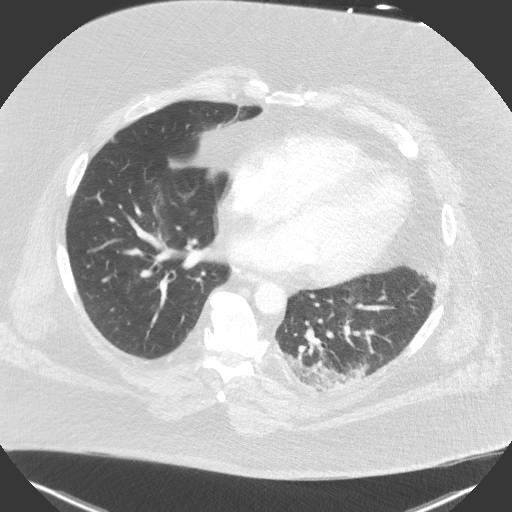
[im 92/166  lung]
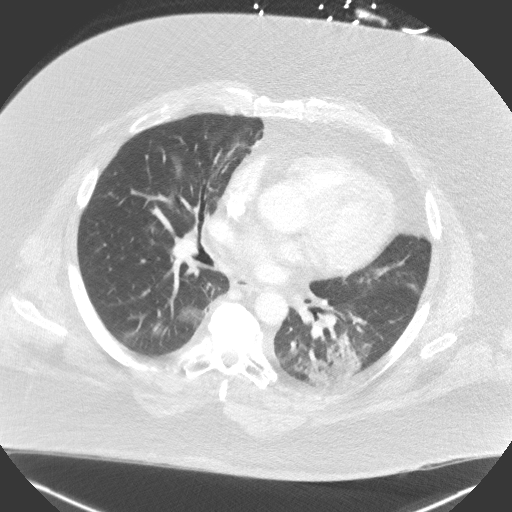
[im 104/166  lung]
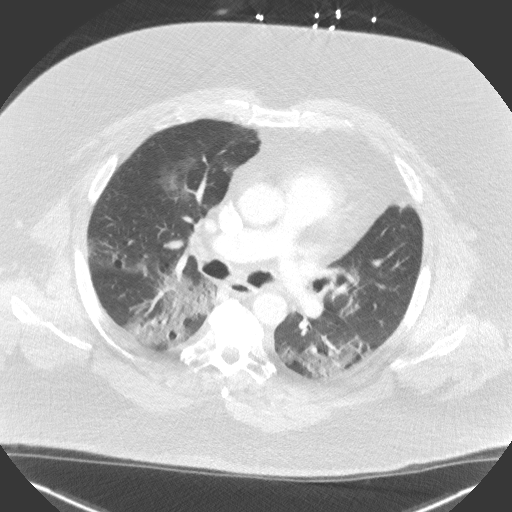
[im 117/166  mediastinal]
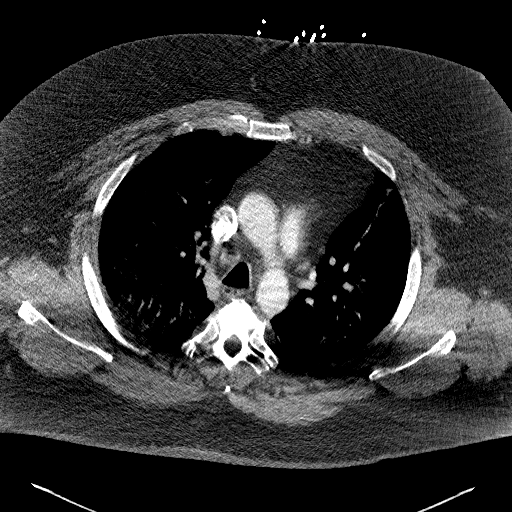
[im 117/166  lung]
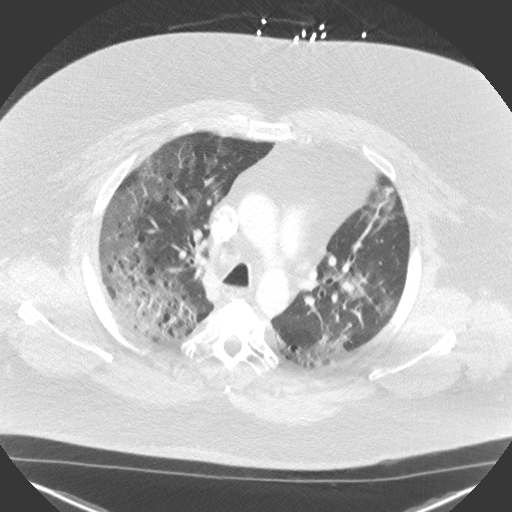
[im 129/166  lung]
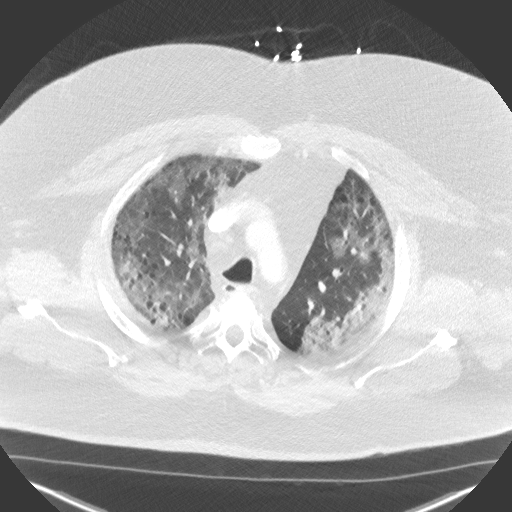
[im 141/166  lung]
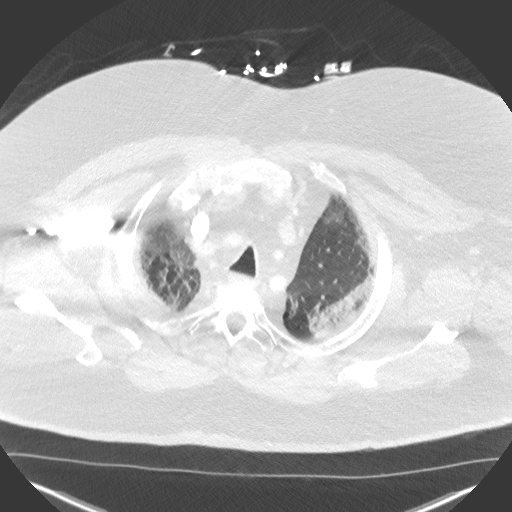
[im 153/166  lung]
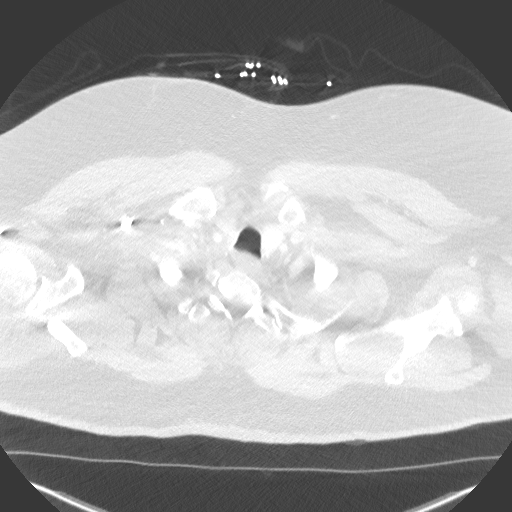

[Series 5: coronal · coronal · 0.69mm/px · 3 of 181 slices shown]
[im 37/181  lung]
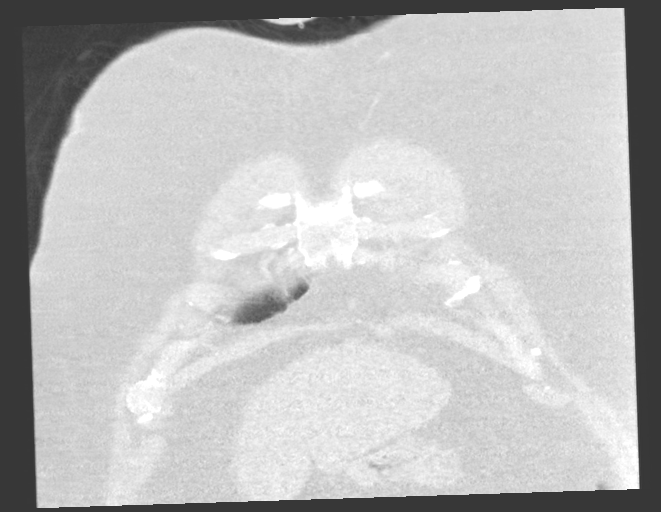
[im 73/181  lung]
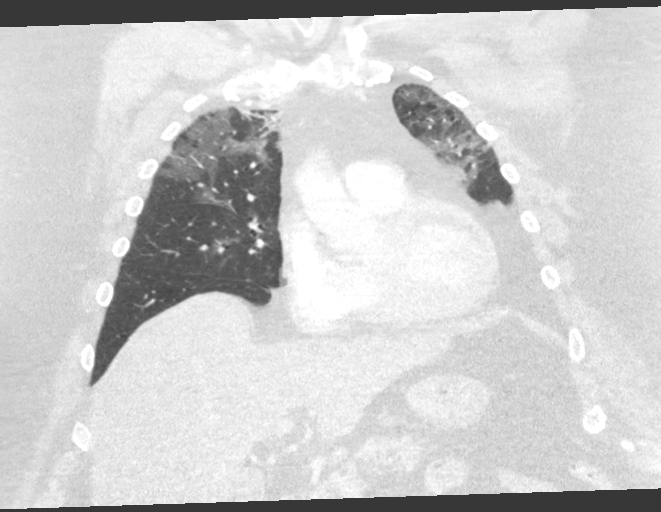
[im 109/181  lung]
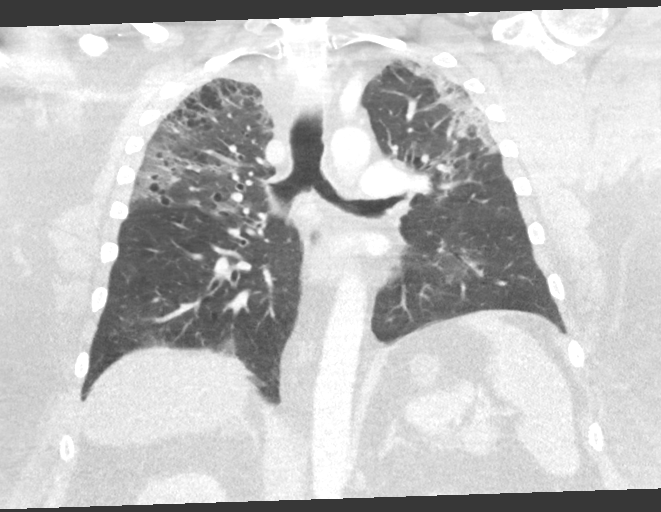

[15 of 36 positions shown; findings below may reference images not displayed]

FINDINGS: Cardiovascular: No appreciable pulmonary emboli. No thoracic aortic
aneurysm or dissection evident. Visualized great vessels appear
unremarkable. Note that the right innominate and left common carotid
arteries arise as a common trunk, an anatomic variant. There is no
appreciable pericardial effusion or pericardial thickening.

Mediastinum/Nodes: Thyroid appears normal. No appreciable thoracic
adenopathy. There is moderate anterior mediastinal fat. No
esophageal lesions are appreciable.

Lungs/Pleura: There is extensive multifocal airspace opacity
throughout the lungs bilaterally consistent with multifocal
pneumonia. No appreciable pleural effusions. No frank consolidation.

Upper Abdomen: There is hepatic steatosis. Visualized upper
abdominal structures otherwise appear unremarkable. Is

Musculoskeletal: There is degenerative change in the thoracic spine
with diffuse idiopathic skeletal hyperostosis in the mid to lower
thoracic region. No blastic or lytic bone lesions. No chest wall
lesions.
IMPRESSION: 1. Multifocal airspace opacity consistent with multifocal pneumonia.
Suspect atypical organism pneumonia, particularly given the clinical
history. No consolidation or pleural effusions.

2.  No evident adenopathy.

3. No pulmonary embolus is appreciable. No thoracic aortic aneurysm
or dissection.

4.  Hepatic steatosis.

5. There is diffuse idiopathic skeletal hyperostosis in the mid to
lower thoracic spine region.

## 2023-07-21 ENCOUNTER — Ambulatory Visit: Payer: Medicare Other | Admitting: Cardiology

## 2023-10-16 ENCOUNTER — Ambulatory Visit: Payer: Medicare Other | Admitting: Cardiology

## 2024-01-15 ENCOUNTER — Ambulatory Visit: Admitting: Cardiology

## 2024-03-19 ENCOUNTER — Ambulatory Visit: Admitting: Cardiology

## 2024-05-03 ENCOUNTER — Ambulatory Visit (INDEPENDENT_AMBULATORY_CARE_PROVIDER_SITE_OTHER): Admitting: Cardiology

## 2024-05-03 ENCOUNTER — Encounter: Payer: Self-pay | Admitting: Cardiology

## 2024-05-03 ENCOUNTER — Other Ambulatory Visit (HOSPITAL_COMMUNITY)
Admission: RE | Admit: 2024-05-03 | Discharge: 2024-05-03 | Disposition: A | Discharge status: Home / Self Care | Source: Ambulatory Visit | Attending: Cardiology | Admitting: Cardiology

## 2024-05-03 ENCOUNTER — Ambulatory Visit: Payer: Self-pay | Admitting: Cardiology

## 2024-05-03 VITALS — BP 150/88 | HR 81 | Ht 71.0 in | Wt >= 6400 oz

## 2024-05-03 DIAGNOSIS — G4733 Obstructive sleep apnea (adult) (pediatric): Secondary | ICD-10-CM | POA: Insufficient documentation

## 2024-05-03 DIAGNOSIS — Z79899 Other long term (current) drug therapy: Secondary | ICD-10-CM

## 2024-05-03 DIAGNOSIS — I10 Essential (primary) hypertension: Secondary | ICD-10-CM | POA: Diagnosis present

## 2024-05-03 DIAGNOSIS — E782 Mixed hyperlipidemia: Secondary | ICD-10-CM | POA: Insufficient documentation

## 2024-05-03 DIAGNOSIS — I25119 Atherosclerotic heart disease of native coronary artery with unspecified angina pectoris: Secondary | ICD-10-CM

## 2024-05-03 LAB — CBC
HCT: 46.1 % (ref 39.0–52.0)
Hemoglobin: 15.5 g/dL (ref 13.0–17.0)
MCH: 30.5 pg (ref 26.0–34.0)
MCHC: 33.6 g/dL (ref 30.0–36.0)
MCV: 90.7 fL (ref 80.0–100.0)
Platelets: 207 K/uL (ref 150–400)
RBC: 5.08 MIL/uL (ref 4.22–5.81)
RDW: 14.1 % (ref 11.5–15.5)
WBC: 6.7 K/uL (ref 4.0–10.5)
nRBC: 0 % (ref 0.0–0.2)

## 2024-05-03 LAB — COMPREHENSIVE METABOLIC PANEL WITH GFR
ALT: 16 U/L (ref 0–44)
AST: 24 U/L (ref 15–41)
Albumin: 4.4 g/dL (ref 3.5–5.0)
Alkaline Phosphatase: 50 U/L (ref 38–126)
Anion gap: 10 (ref 5–15)
BUN: 12 mg/dL (ref 8–23)
CO2: 26 mmol/L (ref 22–32)
Calcium: 9.5 mg/dL (ref 8.9–10.3)
Chloride: 104 mmol/L (ref 98–111)
Creatinine, Ser: 1.3 mg/dL — ABNORMAL HIGH (ref 0.61–1.24)
GFR, Estimated: >60 mL/min (ref >60)
Glucose, Bld: 92 mg/dL (ref 70–99)
Potassium: 4.4 mmol/L (ref 3.5–5.1)
Sodium: 141 mmol/L (ref 135–145)
Total Bilirubin: 0.5 mg/dL (ref 0.0–1.2)
Total Protein: 8 g/dL (ref 6.5–8.1)

## 2024-05-03 LAB — LIPID PANEL
Cholesterol: 180 mg/dL (ref 0–200)
HDL: 35 mg/dL — ABNORMAL LOW (ref >40)
LDL Cholesterol: 111 mg/dL — ABNORMAL HIGH (ref 0–99)
Total CHOL/HDL Ratio: 5.1 ratio
Triglycerides: 168 mg/dL — ABNORMAL HIGH (ref <150)
VLDL: 34 mg/dL (ref 0–40)

## 2024-05-03 MED ORDER — NITROSTAT 0.4 MG SL SUBL: SUBLINGUAL_TABLET | SUBLINGUAL | 3 refills | Status: AC

## 2024-05-03 MED ORDER — ATORVASTATIN CALCIUM 80 MG PO TABS: 80.0000 mg | ORAL_TABLET | Freq: Every day | ORAL | 3 refills | Status: AC

## 2024-05-03 NOTE — Patient Instructions (Signed)
 Medication Instructions:   Your physician recommends that you continue on your current medications as directed. Please refer to the Current Medication list given to you today.   I refilled your NTG and Atorvastatin   Labwork: CBC,CMET,LIPIDS  Testing/Procedures: None today  Follow-Up: 6 months  Any Other Special Instructions Will Be Listed Below (If Applicable).   Referred to pulmonary, they will call you to schedule an appointment.  If you need a refill on your cardiac medications before your next appointment, please call your pharmacy.

## 2024-05-03 NOTE — Progress Notes (Signed)
 Cardiology Office Note  Date: 05/03/2024   ID: Jared [name], DOB [birth year], MRN [id number]  History of Present Illness: Jared [name] is a 63 y.o. male last seen in the office back in May 2022.  He presents to reestablish routine cardiology follow-up.  He tells me that he has not had any medical care in the last few years including follow-up with PCP.  From a symptom perspective, he does not report any regular angina, has used 2 nitroglycerin  tablets in the last year.  He is fairly functionally limited with chronic knee pain and morbid obesity, uses an electric wheelchair most of the time.  Can walk with a cane and walker to some degree.  He was previously followed by Dr. Shellia for management of COPD and OSA on CPAP.  He has had no follow-up with Pulmonary.  No recent lab work for review.  As of 2002 hemoglobin was 14.8, platelets 222, BUN 16, creatinine 1.16 with GFR 72, AST 32, ALT 27, hemoglobin A1c 5.8%, TSH 3.83.  I reviewed his ECG today which shows sinus rhythm with right bundle branch block and left anterior fascicular block, PVC.  Physical Exam: VS:  BP (!) 150/88 (BP Location: Left Wrist)   Pulse 81   Ht 5' 11 (1.803 m)   Wt (!) 460 lb (208.7 kg)   SpO2 95%   BMI 64.16 kg/m , BMI Body mass index is 64.16 kg/m.  Wt Readings from Last 3 Encounters:  05/03/24 (!) 460 lb (208.7 kg)  11/10/20 (!) 459 lb (208.2 kg)  04/28/20 (!) 469 lb (212.7 kg)    General: Morbidly obese male in motorized wheelchair. HEENT: Conjunctiva and lids normal, oropharynx clear. Neck: Supple, difficult to assess JVP with increased girth, no carotid bruits. Lungs: Creased breath sounds without wheezing, nonlabored breathing at rest. Cardiac: Regular rate and rhythm, no S3 or significant systolic murmur, no pericardial rub. Abdomen: Morbidly obese, bowel sounds present. Extremities: Venous stasis, distal pulses 2+. Skin: Warm and dry. Musculoskeletal: No kyphosis. Neuropsychiatric:  Alert and oriented x3, affect grossly appropriate.  ECG:  An ECG dated 03/29/2020 was personally reviewed today and demonstrated:  Sinus tachycardia with right bundle branch block and left anterior fascicular block.  Labwork:  No interval lab work available for review today.  Other Studies Reviewed Today:  No interval cardiac testing for review today.  Assessment and Plan:  1.  CAD status post DES to proximal LAD in 2016.  LVEF 60 to 65% by echocardiogram in 2021.  Patient reestablishing cardiology follow-up today, does not report any accelerating angina or increasing nitroglycerin  use.  ECG reviewed and stable.  Continue aspirin  81 mg daily, refill provided for Lipitor  80 mg daily and as needed nitroglycerin .  2.  Health maintenance.  He plans to reestablish with PCP at [facility] in [place].  We will plan to check CBC, CMET, and FLP for now.  3.  COPD and OSA on CPAP.  Scheduling follow-up with [place] at [facility].  4.  Mixed hyperlipidemia.  Check FLP.  Refill Lipitor  80 mg daily for now.  5.  Morbid obesity.  BMI 64.  May be a good candidate for Ozempic or Wegovy particularly given other comorbidities.  Needs to reestablish follow-up and get lab work first.  6.  Primary hypertension.  Plan to review lab work and then consider medication options.  Certainly weight loss would be helpful.  May be a good candidate for ARB.  Hold off  on resuming Lopressor  given conduction disease by ECG.  Disposition:  Follow up 6 months.  Signed, Jayson JUDITHANN Sierras, M.D., F.A.C.C. [facility]

## 2024-05-06 MED ORDER — LOSARTAN POTASSIUM 25 MG PO TABS: 25.0000 mg | ORAL_TABLET | Freq: Every day | ORAL | 11 refills | Status: AC

## 2024-05-06 NOTE — Telephone Encounter (Signed)
-----   Message from Jayson Sierras sent at 05/03/2024 10:21 AM EST ----- Results reviewed.  LDL 111.  Resume Lipitor  as planned.  Creatinine is 1.3 with GFR greater than 60.  Let's start him on Cozaar 25 mg daily as well with repeat BMET in 2 weeks.  Hemoglobin and  platelet count normal. ----- Message ----- From: Interface, Lab In [place] Sent: 05/03/2024   9:26 AM EST To: Jayson KANDICE Sierras, MD

## 2024-05-06 NOTE — Telephone Encounter (Signed)
 The patient has been notified of the result and verbalized understanding.  All questions (if any) were answered. Bernett Dorothyann LABOR, RN 05/06/2024 10:11 AM   Rx for losartan 25 mg daily [facility]   PCP copied

## 2024-07-23 ENCOUNTER — Telehealth: Payer: Self-pay

## 2024-08-20 ENCOUNTER — Ambulatory Visit
